# Patient Record
Sex: Male | Born: 1960 | Race: Black or African American | Hispanic: No | Marital: Married | Smoking: Former smoker
Health system: Southern US, Community
[De-identification: ages and names within clinical notes are randomized; demographics above are authoritative.]

## PROBLEM LIST (undated history)

## (undated) DIAGNOSIS — E559 Vitamin D deficiency, unspecified: Secondary | ICD-10-CM

## (undated) DIAGNOSIS — I4510 Unspecified right bundle-branch block: Secondary | ICD-10-CM

## (undated) DIAGNOSIS — E785 Hyperlipidemia, unspecified: Secondary | ICD-10-CM

## (undated) DIAGNOSIS — I2510 Atherosclerotic heart disease of native coronary artery without angina pectoris: Secondary | ICD-10-CM

## (undated) DIAGNOSIS — I700 Atherosclerosis of aorta: Secondary | ICD-10-CM

## (undated) DIAGNOSIS — K4090 Unilateral inguinal hernia, without obstruction or gangrene, not specified as recurrent: Secondary | ICD-10-CM

## (undated) DIAGNOSIS — M1990 Unspecified osteoarthritis, unspecified site: Secondary | ICD-10-CM

## (undated) DIAGNOSIS — I739 Peripheral vascular disease, unspecified: Secondary | ICD-10-CM

## (undated) DIAGNOSIS — C61 Malignant neoplasm of prostate: Secondary | ICD-10-CM

## (undated) DIAGNOSIS — L80 Vitiligo: Secondary | ICD-10-CM

## (undated) DIAGNOSIS — I5030 Unspecified diastolic (congestive) heart failure: Secondary | ICD-10-CM

## (undated) DIAGNOSIS — E8881 Metabolic syndrome: Secondary | ICD-10-CM

## (undated) DIAGNOSIS — I723 Aneurysm of iliac artery: Secondary | ICD-10-CM

## (undated) DIAGNOSIS — G4730 Sleep apnea, unspecified: Secondary | ICD-10-CM

## (undated) DIAGNOSIS — C649 Malignant neoplasm of unspecified kidney, except renal pelvis: Secondary | ICD-10-CM

## (undated) DIAGNOSIS — Z7902 Long term (current) use of antithrombotics/antiplatelets: Secondary | ICD-10-CM

## (undated) DIAGNOSIS — I779 Disorder of arteries and arterioles, unspecified: Secondary | ICD-10-CM

## (undated) DIAGNOSIS — N529 Male erectile dysfunction, unspecified: Secondary | ICD-10-CM

## (undated) DIAGNOSIS — I10 Essential (primary) hypertension: Secondary | ICD-10-CM

## (undated) DIAGNOSIS — R3129 Other microscopic hematuria: Secondary | ICD-10-CM

## (undated) HISTORY — DX: Vitiligo: L80

## (undated) HISTORY — DX: Unspecified osteoarthritis, unspecified site: M19.90

## (undated) HISTORY — DX: Other microscopic hematuria: R31.29

## (undated) HISTORY — DX: Vitamin D deficiency, unspecified: E55.9

## (undated) HISTORY — DX: Hypercalcemia: E83.52

## (undated) HISTORY — DX: Essential (primary) hypertension: I10

## (undated) HISTORY — DX: Metabolic syndrome: E88.81

## (undated) HISTORY — DX: Malignant neoplasm of unspecified kidney, except renal pelvis: C64.9

## (undated) HISTORY — DX: Metabolic syndrome: E88.810

## (undated) HISTORY — DX: Peripheral vascular disease, unspecified: I73.9

## (undated) HISTORY — DX: Hyperlipidemia, unspecified: E78.5

## (undated) HISTORY — PX: OTHER SURGICAL HISTORY: SHX169

## (undated) HISTORY — DX: Malignant neoplasm of prostate: C61

## (undated) HISTORY — PX: ILIAC VEIN ANGIOPLASTY / STENTING: SHX1788

---

## 2008-02-04 HISTORY — PX: PARTIAL NEPHRECTOMY: SHX414

## 2008-02-04 HISTORY — PX: NEPHRECTOMY: SHX65

## 2008-05-22 ENCOUNTER — Ambulatory Visit: Payer: Self-pay | Admitting: Family Medicine

## 2008-06-14 ENCOUNTER — Ambulatory Visit: Payer: Self-pay | Admitting: Nephrology

## 2008-06-26 ENCOUNTER — Ambulatory Visit: Payer: Self-pay | Admitting: Gastroenterology

## 2008-08-08 ENCOUNTER — Other Ambulatory Visit: Payer: Self-pay | Admitting: Nephrology

## 2008-08-09 ENCOUNTER — Observation Stay: Payer: Self-pay | Admitting: Nephrology

## 2010-06-20 ENCOUNTER — Ambulatory Visit: Payer: Self-pay | Admitting: Vascular Surgery

## 2011-02-04 DIAGNOSIS — I219 Acute myocardial infarction, unspecified: Secondary | ICD-10-CM

## 2011-02-04 HISTORY — DX: Acute myocardial infarction, unspecified: I21.9

## 2011-02-04 HISTORY — PX: ILIAC VEIN ANGIOPLASTY / STENTING: SHX1788

## 2011-05-02 ENCOUNTER — Other Ambulatory Visit: Payer: Self-pay | Admitting: Family Medicine

## 2011-05-02 LAB — LIPID PANEL
HDL Cholesterol: 52 mg/dL (ref 40–60)
Triglycerides: 39 mg/dL (ref 0–200)
VLDL Cholesterol, Calc: 8 mg/dL (ref 5–40)

## 2011-05-02 LAB — HEMOGLOBIN A1C: Hemoglobin A1C: 6 % (ref 4.2–6.3)

## 2011-05-02 LAB — COMPREHENSIVE METABOLIC PANEL
Chloride: 108 mmol/L — ABNORMAL HIGH (ref 98–107)
Creatinine: 1.06 mg/dL (ref 0.60–1.30)
Osmolality: 284 (ref 275–301)
Potassium: 4 mmol/L (ref 3.5–5.1)
SGOT(AST): 31 U/L (ref 15–37)
Sodium: 143 mmol/L (ref 136–145)
Total Protein: 7.5 g/dL (ref 6.4–8.2)

## 2012-01-11 ENCOUNTER — Emergency Department: Payer: Self-pay | Admitting: Internal Medicine

## 2012-01-11 DIAGNOSIS — I2109 ST elevation (STEMI) myocardial infarction involving other coronary artery of anterior wall: Secondary | ICD-10-CM

## 2012-01-11 HISTORY — DX: ST elevation (STEMI) myocardial infarction involving other coronary artery of anterior wall: I21.09

## 2012-01-11 HISTORY — PX: LEFT HEART CATH: SHX5946

## 2012-01-11 LAB — BASIC METABOLIC PANEL
BUN: 15 mg/dL (ref 7–18)
Chloride: 109 mmol/L — ABNORMAL HIGH (ref 98–107)
EGFR (African American): >60
EGFR (Non-African Amer.): >60
Glucose: 128 mg/dL — ABNORMAL HIGH (ref 65–99)
Osmolality: 282 (ref 275–301)
Potassium: 3.5 mmol/L (ref 3.5–5.1)
Sodium: 140 mmol/L (ref 136–145)

## 2012-01-11 LAB — CBC
HCT: 43.1 % (ref 40.0–52.0)
HGB: 15.1 g/dL (ref 13.0–18.0)
MCHC: 35 g/dL (ref 32.0–36.0)
MCV: 97 fL (ref 80–100)
Platelet: 246 10*3/uL (ref 150–440)
RBC: 4.44 10*6/uL (ref 4.40–5.90)
RDW: 13.7 % (ref 11.5–14.5)
WBC: 10.2 10*3/uL (ref 3.8–10.6)

## 2012-01-11 LAB — CK TOTAL AND CKMB (NOT AT ARMC)
CK, Total: 209 U/L (ref 35–232)
CK-MB: 1 ng/mL (ref 0.5–3.6)

## 2012-01-11 LAB — TROPONIN I: Troponin-I: <0.02 ng/mL

## 2012-01-26 ENCOUNTER — Encounter: Payer: Self-pay | Admitting: Cardiovascular Disease

## 2012-02-04 ENCOUNTER — Encounter: Payer: Self-pay | Admitting: Cardiovascular Disease

## 2012-03-06 ENCOUNTER — Encounter: Payer: Self-pay | Admitting: Cardiovascular Disease

## 2012-04-03 ENCOUNTER — Encounter: Payer: Self-pay | Admitting: Cardiovascular Disease

## 2012-07-20 LAB — HM HEPATITIS C SCREENING LAB: HM HEPATITIS C SCREENING: NEGATIVE

## 2012-07-20 LAB — HEMOGLOBIN A1C: Hgb A1c MFr Bld: 6 % (ref 4.0–6.0)

## 2012-11-30 DIAGNOSIS — C61 Malignant neoplasm of prostate: Secondary | ICD-10-CM | POA: Insufficient documentation

## 2013-03-08 ENCOUNTER — Emergency Department: Payer: Self-pay | Admitting: Emergency Medicine

## 2013-03-08 LAB — BASIC METABOLIC PANEL
ANION GAP: 8 (ref 7–16)
BUN: 27 mg/dL — ABNORMAL HIGH (ref 7–18)
CALCIUM: 10.2 mg/dL — AB (ref 8.5–10.1)
CO2: 22 mmol/L (ref 21–32)
Chloride: 104 mmol/L (ref 98–107)
Creatinine: 1.32 mg/dL — ABNORMAL HIGH (ref 0.60–1.30)
EGFR (African American): >60
Glucose: 127 mg/dL — ABNORMAL HIGH (ref 65–99)
OSMOLALITY: 275 (ref 275–301)
POTASSIUM: 3.6 mmol/L (ref 3.5–5.1)
SODIUM: 134 mmol/L — AB (ref 136–145)

## 2013-03-08 LAB — CBC WITH DIFFERENTIAL/PLATELET
Basophil #: 0 10*3/uL (ref 0.0–0.1)
Basophil %: 0.2 %
EOS ABS: 0 10*3/uL (ref 0.0–0.7)
Eosinophil %: 0.1 %
HCT: 43.8 % (ref 40.0–52.0)
HGB: 14.4 g/dL (ref 13.0–18.0)
LYMPHS ABS: 0.8 10*3/uL — AB (ref 1.0–3.6)
Lymphocyte %: 6.6 %
MCH: 32.3 pg (ref 26.0–34.0)
MCHC: 32.9 g/dL (ref 32.0–36.0)
MCV: 98 fL (ref 80–100)
MONO ABS: 0.4 x10 3/mm (ref 0.2–1.0)
Monocyte %: 3.3 %
NEUTROS ABS: 11.2 10*3/uL — AB (ref 1.4–6.5)
NEUTROS PCT: 89.8 %
Platelet: 264 10*3/uL (ref 150–440)
RBC: 4.46 10*6/uL (ref 4.40–5.90)
RDW: 14.1 % (ref 11.5–14.5)
WBC: 12.4 10*3/uL — ABNORMAL HIGH (ref 3.8–10.6)

## 2013-05-16 ENCOUNTER — Ambulatory Visit: Payer: Self-pay | Admitting: Vascular Surgery

## 2013-05-16 LAB — BASIC METABOLIC PANEL
Anion Gap: 5 — ABNORMAL LOW (ref 7–16)
BUN: 14 mg/dL (ref 7–18)
Calcium, Total: 9.7 mg/dL (ref 8.5–10.1)
Chloride: 109 mmol/L — ABNORMAL HIGH (ref 98–107)
Co2: 26 mmol/L (ref 21–32)
Creatinine: 1.06 mg/dL (ref 0.60–1.30)
EGFR (African American): >60
GLUCOSE: 111 mg/dL — AB (ref 65–99)
Osmolality: 281 (ref 275–301)
Potassium: 3.6 mmol/L (ref 3.5–5.1)
Sodium: 140 mmol/L (ref 136–145)

## 2013-05-17 LAB — CBC WITH DIFFERENTIAL/PLATELET
Basophil #: 0 10*3/uL (ref 0.0–0.1)
Basophil %: 0 %
Eosinophil #: 0 10*3/uL (ref 0.0–0.7)
Eosinophil %: 0 %
HCT: 35.8 % — AB (ref 40.0–52.0)
HGB: 11.8 g/dL — AB (ref 13.0–18.0)
LYMPHS ABS: 0.5 10*3/uL — AB (ref 1.0–3.6)
Lymphocyte %: 4 %
MCH: 33.2 pg (ref 26.0–34.0)
MCHC: 33.1 g/dL (ref 32.0–36.0)
MCV: 100 fL (ref 80–100)
Monocyte #: 0.5 x10 3/mm (ref 0.2–1.0)
Monocyte %: 4.3 %
Neutrophil #: 11 10*3/uL — ABNORMAL HIGH (ref 1.4–6.5)
Neutrophil %: 91.7 %
PLATELETS: 172 10*3/uL (ref 150–440)
RBC: 3.57 10*6/uL — ABNORMAL LOW (ref 4.40–5.90)
RDW: 14.5 % (ref 11.5–14.5)
WBC: 12.1 10*3/uL — AB (ref 3.8–10.6)

## 2013-05-17 LAB — BASIC METABOLIC PANEL
Anion Gap: 7 (ref 7–16)
BUN: 19 mg/dL — ABNORMAL HIGH (ref 7–18)
CHLORIDE: 109 mmol/L — AB (ref 98–107)
CREATININE: 1.12 mg/dL (ref 0.60–1.30)
Calcium, Total: 9.1 mg/dL (ref 8.5–10.1)
Co2: 23 mmol/L (ref 21–32)
EGFR (Non-African Amer.): >60
GLUCOSE: 150 mg/dL — AB (ref 65–99)
Osmolality: 283 (ref 275–301)
Potassium: 4 mmol/L (ref 3.5–5.1)
Sodium: 139 mmol/L (ref 136–145)

## 2013-08-03 LAB — LIPID PANEL
Cholesterol: 138 mg/dL (ref 0–200)
HDL: 62 mg/dL (ref 35–70)
LDL Cholesterol: 69 mg/dL
TRIGLYCERIDES: 35 mg/dL — AB (ref 40–160)

## 2013-09-22 LAB — PSA: PSA: 3.7

## 2014-05-27 NOTE — Op Note (Signed)
PATIENT NAME:  Roberto Walker, Roberto Walker MR#:  [id number] DATE OF BIRTH:  1960/05/23  DATE OF PROCEDURE:  05/16/2013  PREOPERATIVE DIAGNOSES: 1.  Thrombosis/embolus of left lower extremity, status post recent intervention, with ischemic rest pain and peripheral arterial disease.  2.  Coronary artery disease.  3.  Prostate cancer.   POSTOPERATIVE DIAGNOSES: 1.  Thrombosis/embolus of left lower extremity, status post recent intervention, with ischemic rest pain and peripheral arterial disease.  2.  Coronary artery disease.  3.  Prostate cancer.   PROCEDURE:   1.  Ultrasound guidance for vascular access to bilateral femoral arteries.  2.  Catheter placement to aorta from left femoral approach.  3.  Catheter placement to left anterior tibial artery and left posterior tibial artery from right femoral approach.  4.  Aortogram and selective left lower extremity angiogram.  5.  Catheter-directed thrombolysis with 4 mg of tPA to the left external iliac artery.  6.  Percutaneous transluminal angioplasty of left external iliac artery with 7 and 8 mm diameter angioplasty balloon.  7.  Self-expanding stent placement to the distal left common iliac artery and proximal left external iliac artery with a 10 mm diameter self-expanding stent.  8.  Catheter-directed thrombolysis with 4 mg of tPA to the left popliteal, tibioperoneal trunk, and posterior tibial arteries.  9.  Mechanical rheolytic thrombectomy to left popliteal, tibioperoneal trunk, and posterior tibial arteries.  10.  Mechanical rheolytic thrombectomy also to the left external iliac artery.  11.  Percutaneous transluminal angioplasty with 4 mm diameter angioplasty balloon to left popliteal artery, tibioperoneal trunk, and posterior tibial artery.  12.  Percutaneous transluminal angioplasty of left anterior tibial artery with a 4 mm diameter angioplasty balloon.  13.  StarClose closure device to bilateral femoral arteries.   SURGEON: Algernon Huxley, MD    ANESTHESIA: Local with moderate conscious sedation.   ESTIMATED BLOOD LOSS: Approximately 50 mL.  FLUOROSCOPY TIME: 24.8 minutes.   CONTRAST USED: 110 mL.   INDICATION FOR PROCEDURE: This is a gentleman who was intervened upon for claudication symptoms, who presented back to the hospital with worsening pain and numbness in the left lower extremity and reduced perfusion. He is brought back to the angiography suite for further evaluation. Risks and benefits were discussed. Informed consent was obtained.   DESCRIPTION OF PROCEDURE: The patient was brought to the vascular suite. Groins were shaved and prepped, and a sterile surgical field was created. I initially started through the right groin. Imaging was performed, which showed rethrombosis of the left external iliac artery stent that had been previously intervened upon. He reconstituted in the common femoral artery. Perfusion distally was difficult to opacify, but no flow was seen past the popliteal artery. I could not cross the occlusion from the right femoral approach in the left iliac due to the marked tortuosity and steep aortic bifurcation with no catheter support. I had to stick the left common femoral artery. Both femorals were actually accessed under ultrasound guidance, and a 6-French sheath was placed on the left. I was able to cross the occlusion from the left femoral access and confirm intraluminal flow. I then replaced the wire up into the aorta; 4 mg of tPA was instilled to the left external iliac artery. This was allowed to dwell for approximately 10 minutes. Mechanical rheolytic thrombectomy was then performed, but thrombus still remained. It was ballooned with a 7 and 8 mm diameter angioplasty balloon, but there was a chunk of thrombus and native disease at the origin  of the left external iliac artery that remained flow limiting. I elected to treat this with a self-expanding stent. A 10 mm diameter x 4 cm length self-expanding stent  was deployed due to the larger size of the common iliac artery. The external iliac portion was treated with an 8 mm balloon, the common iliac area was treated with a 10 mm balloon, and  there was a good angiographic completion result. I could now cross from the right femoral approach and could catheter access into the left superficial femoral artery. Imaging was performed, which showed embolus and thrombosis to the left popliteal artery and tibial vessels. A 6-French High-Flex Ansel sheath was placed over a Terumo Advantage wire. I removed the left femoral sheath, and StarClose closure device was deployed on the left. I was able to cross the occlusion and confirm intraluminal flow in the posterior tibial artery. I then replaced the 0.035 wire; 4 mg more of tPA were delivered in the left popliteal artery, tibioperoneal trunk, and posterior tibial arteries. This was allowed to dwell, and then mechanical rheolytic thrombectomy was performed on the same vessels. Following this, a 4 mm diameter angioplasty balloon was inflated from the mid popliteal artery down to the proximal posterior tibial artery, with improvement of flow but not resolution of the occlusion. There was thrombus in the proximal anterior tibial artery. I was able to rewire this and cross this lesion without difficulty and treat this area with a 4 mm diameter angioplasty balloon separately in the anterior tibial artery. At this point, imaging was performed. The posterior tibial artery was occluded distally, but did have some collaterals and was patent proximally. The peroneal artery also had improved flow, and the anterior tibial artery was continuous down to the foot. There was still some residual thrombus in  all the tibial vessels remaining, but there was now markedly improved flow. I elected to leave this and treat it with a GIII/IIb inhibitor. StarClose closure device was then deployed in the right femoral artery with excellent hemostatic result.  The patient was then taken to the recovery room in stable condition having tolerated the procedure well with a palpable pulse at the ankle.    ____________________________ Algernon Huxley, MD jsd:jcm D: 05/16/2013 16:57:53 ET T: 05/16/2013 21:33:10 ET JOB#: [id number]  cc: Algernon Huxley, MD, <Dictator> Algernon Huxley MD ELECTRONICALLY SIGNED 05/19/2013 15:30

## 2014-05-27 NOTE — Op Note (Signed)
PATIENT NAME:  Roberto Walker, Roberto Walker MR#:  [id number] DATE OF BIRTH:  09-13-1960  DATE OF PROCEDURE:  05/16/2013  PREOPERATIVE DIAGNOSIS: Peripheral arterial disease with claudication, left lower extremity.  POSTOPERATIVE DIAGNOSIS: Peripheral arterial disease with claudication, left lower extremity.  PROCEDURES: 1.  Ultrasound guidance for vascular access bilateral femoral arteries.  2.  Catheter placement into aorta from bilateral femoral approaches.  3.  Aortogram and iliofemoral arteriogram.  4.  Percutaneous transluminal angioplasty with drug-eluting 6 mm diameter angioplasty balloon to the left external iliac artery.  5.  Percutaneous transluminal angioplasty of left external iliac artery with 7 mm and 8 mm  diameter conventional angioplasty balloon.  6.  Catheter directed thrombolysis with 4 mg of TPA delivered with the AngioJet AVX catheter to the left external iliac artery.  7.  Mechanical rheolytic thrombectomy of left external iliac artery.  8.  StarClose closure device of bilateral femoral arteries.   SURGEON: Algernon Huxley, M.D.   ANESTHESIA: Local with moderate conscious sedation.   ESTIMATED BLOOD LOSS: 25 mL.   INDICATION FOR PROCEDURE: This is a 54 year old African American male who has short distance lifestyle limiting claudication of the left lower extremity. He has a previous history of left iliac artery intervention. Noninvasive study has shown this to be re-occluded. He is brought in today for angiography for further evaluation and potential treatment. Risks and benefits were discussed. Informed consent was obtained.   DESCRIPTION OF PROCEDURE: The patient is brought to the vascular suite. Groins were shaved and prepped and a sterile surgical field was created. I initially started by accessing the right femoral artery. Ultrasound is used to visualize the femoral arteries. Needle was used to access the femoral artery without difficulty, and a 5-French sheath was then placed.  Pigtail catheter was placed in the aorta and an AP aortogram was performed. This demonstrated marked tortuosity of the iliac vessels with no flow-limiting stenosis in the right iliac system. The left common iliac artery was generous and was over 10 mm in diameter. The left external iliac artery where the stent had previously been placed has an occlusion that reconstituted in the common femoral artery on delayed imaging. I attempted to cross the aortic bifurcation, but due to the marked tortuosity and the steep aortic bifurcation this was not going to be successful in crossing the lesion. I then used ultrasound to access to left femoral artery. Under direct ultrasound guidance this was accessed without difficulty and a permanent image was recorded. J-wire and 6-French sheath were then placed. The patient was systemically heparinized with 4000 units of intravenous heparin. I was able to get a Kumpe catheter and an Advantage wire and cross the lesion of the occlusion in the external iliac artery. I confirmed intraluminal flow up in the aorta and then replaced the wire. I initially treated this lesion with a 6 mm diameter Lutonix drug-coated angioplasty balloon. However, there was still significant residual stenosis throughout the stent and in the external iliac artery below the stent. I used an 8 mm diameter angioplasty balloon at the proximal edge of the stent where the common iliac artery was larger and a 7 mm diameter angioplasty balloon distally. Following this there was thrombus present throughout the stent, particularly at the proximal portion. I instilled 4 mg of TPA with the AngioJet AVX catheter in power pulse spray fashion and allowed this to dwell. Mechanical rheolytic thrombectomy was then performed which resolved about 90% of the thrombus. There was still a small amount of residual  thrombus, particularly at the leading edge of the stent and into the hypogastric artery. I retreated the area with a 7 mm  diameter angioplasty balloon. At this point, there was in-line flow. The residual thrombus did not appear flow-limiting and there was brisk flow into the external iliac artery and common femoral artery where the sheath was at. Further mechanical rheolytic thrombectomy was performed, but the thrombus would not clear. At this point, I elected to terminate the procedure and we will keep the patient on anticoagulation. The sheath was removed. A StarClose closure device was deployed in bilateral femoral arteries with excellent hemostatic result. The patient tolerated the procedure well and was taken to the recovery room in stable condition.   ____________________________ Algernon Huxley, MD jsd:sb D: 05/16/2013 11:47:53 ET T: 05/16/2013 12:14:36 ET JOB#: [id number]  cc: Algernon Huxley, MD, <Dictator> Bethena Roys. Ancil Boozer, MD  Algernon Huxley MD ELECTRONICALLY SIGNED 05/19/2013 15:30

## 2014-08-02 ENCOUNTER — Encounter: Payer: Self-pay | Admitting: Family Medicine

## 2014-08-02 DIAGNOSIS — N529 Male erectile dysfunction, unspecified: Secondary | ICD-10-CM | POA: Insufficient documentation

## 2014-08-02 DIAGNOSIS — E559 Vitamin D deficiency, unspecified: Secondary | ICD-10-CM | POA: Insufficient documentation

## 2014-08-02 DIAGNOSIS — E8881 Metabolic syndrome: Secondary | ICD-10-CM | POA: Insufficient documentation

## 2014-08-02 DIAGNOSIS — E785 Hyperlipidemia, unspecified: Secondary | ICD-10-CM | POA: Insufficient documentation

## 2014-08-02 DIAGNOSIS — I2510 Atherosclerotic heart disease of native coronary artery without angina pectoris: Secondary | ICD-10-CM | POA: Insufficient documentation

## 2014-08-02 DIAGNOSIS — I739 Peripheral vascular disease, unspecified: Secondary | ICD-10-CM | POA: Insufficient documentation

## 2014-08-02 DIAGNOSIS — M1990 Unspecified osteoarthritis, unspecified site: Secondary | ICD-10-CM | POA: Insufficient documentation

## 2014-08-02 DIAGNOSIS — L80 Vitiligo: Secondary | ICD-10-CM | POA: Insufficient documentation

## 2014-08-02 DIAGNOSIS — I252 Old myocardial infarction: Secondary | ICD-10-CM | POA: Insufficient documentation

## 2014-08-02 DIAGNOSIS — I10 Essential (primary) hypertension: Secondary | ICD-10-CM | POA: Insufficient documentation

## 2014-08-02 DIAGNOSIS — R739 Hyperglycemia, unspecified: Secondary | ICD-10-CM | POA: Insufficient documentation

## 2014-08-02 DIAGNOSIS — E291 Testicular hypofunction: Secondary | ICD-10-CM | POA: Insufficient documentation

## 2014-08-02 DIAGNOSIS — G4733 Obstructive sleep apnea (adult) (pediatric): Secondary | ICD-10-CM | POA: Insufficient documentation

## 2014-08-02 DIAGNOSIS — R202 Paresthesia of skin: Secondary | ICD-10-CM | POA: Insufficient documentation

## 2014-08-02 DIAGNOSIS — T783XXA Angioneurotic edema, initial encounter: Secondary | ICD-10-CM | POA: Insufficient documentation

## 2014-08-02 DIAGNOSIS — Z9989 Dependence on other enabling machines and devices: Secondary | ICD-10-CM

## 2014-08-04 ENCOUNTER — Ambulatory Visit (INDEPENDENT_AMBULATORY_CARE_PROVIDER_SITE_OTHER): Payer: Federal, State, Local not specified - PPO | Admitting: Family Medicine

## 2014-08-04 ENCOUNTER — Encounter (INDEPENDENT_AMBULATORY_CARE_PROVIDER_SITE_OTHER): Payer: Self-pay

## 2014-08-04 ENCOUNTER — Encounter: Payer: Self-pay | Admitting: Family Medicine

## 2014-08-04 ENCOUNTER — Other Ambulatory Visit: Payer: Self-pay

## 2014-08-04 VITALS — BP 126/88 | HR 71 | Temp 98.1°F | Resp 16 | Ht 72.0 in | Wt 211.2 lb

## 2014-08-04 DIAGNOSIS — R739 Hyperglycemia, unspecified: Secondary | ICD-10-CM | POA: Diagnosis not present

## 2014-08-04 DIAGNOSIS — Z1211 Encounter for screening for malignant neoplasm of colon: Secondary | ICD-10-CM | POA: Diagnosis not present

## 2014-08-04 DIAGNOSIS — G4733 Obstructive sleep apnea (adult) (pediatric): Secondary | ICD-10-CM

## 2014-08-04 DIAGNOSIS — I10 Essential (primary) hypertension: Secondary | ICD-10-CM

## 2014-08-04 DIAGNOSIS — E559 Vitamin D deficiency, unspecified: Secondary | ICD-10-CM | POA: Diagnosis not present

## 2014-08-04 DIAGNOSIS — I2510 Atherosclerotic heart disease of native coronary artery without angina pectoris: Secondary | ICD-10-CM

## 2014-08-04 DIAGNOSIS — E785 Hyperlipidemia, unspecified: Secondary | ICD-10-CM

## 2014-08-04 DIAGNOSIS — Z9989 Dependence on other enabling machines and devices: Secondary | ICD-10-CM

## 2014-08-04 MED ORDER — NITROGLYCERIN 0.4 MG SL SUBL: 0.4000 mg | SUBLINGUAL_TABLET | Freq: Every day | SUBLINGUAL | Status: DC

## 2014-08-04 NOTE — Progress Notes (Signed)
Name: Roberto Walker   MRN: [id number]    DOB: [birth year]   Date:08/04/2014       Progress Note  Subjective  Chief Complaint  Chief Complaint  Patient presents with  . Follow-up    4 month  . Hyperlipidemia    Patient states that he is having no issues at this time.   . Hypertension    Patient states that he is checking his blood pressure at home and it is currently running 125/86    HPI  CAD: he had an MI back in 2013, s/p stent placement, he has been asymptomatic since event. He sees Dr. Terance Hart at [facility] twice year. Taking Aspirin and Plavix, he denies easy bruising or bleeding.   HTN: usually under control, recent visits to previous providers it was within normal limits  Hyperlipidemia: taking Atorvastatin, and is compliant , denies myalgia.   OSA: he is compliant with CPAP machine, wakes up feeling rested. He states only needs 6 hours of sleep and he feels great during the day.  Patient Active Problem List   Diagnosis Date Noted  . Benign essential HTN 08/02/2014  . Arteriosclerosis of coronary artery 08/02/2014  . Dyslipidemia 08/02/2014  . H/O acute myocardial infarction 08/02/2014  . Calcium blood increased 08/02/2014  . 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/02/2014  . Dysmetabolic syndrome 89/21/1941  . Arthritis, degenerative 08/02/2014  . Cancer of kidney 08/02/2014  . Peripheral blood vessel disorder 08/02/2014  . Blood glucose elevated 08/02/2014  . Obstructive sleep apnea on CPAP 08/02/2014  . Vitamin D deficiency 08/02/2014  . Vitiligo 08/02/2014  . CA of prostate 10/01/2012    Past Surgical History  Procedure Laterality Date  . Left heart cath  01/11/2012  . Beam radiation      Prostate using Photons  . Nephrectomy Right 2010    Partial  . Iliac vein angioplasty / stenting      Family History  Problem Relation Age of Onset  . Hypertension Mother   . Lung cancer Father   . Hypertension Father   . Hyperlipidemia Father    . Diabetes Sister     oldest sister  . Hypertension Sister   . Hypertension Sister     History   Social History  . Marital Status: Married    Spouse Name: Mateo Flow  . Number of Children: 2  . Years of Education: College    Occupational History  . [employer]    Social History Main Topics  . Smoking status: Former Smoker -- 20 years    Types: Cigarettes    Quit date: 01/12/2012  . Smokeless tobacco: Not on file  . Alcohol Use: 0.0 oz/week    0 Standard drinks or equivalent per week     Comment: Minimal alcohol consumption  . Drug Use: No  . Sexual Activity:    Partners: Female   Other Topics Concern  . Not on file   Social History Narrative     Current outpatient prescriptions:  .  aspirin 81 MG tablet, Take 1 tablet by mouth daily., Disp: , Rfl:  .  amLODipine-valsartan (EXFORGE) 10-320 MG per tablet, Take 1 tablet by mouth daily., Disp: , Rfl:  .  atorvastatin (LIPITOR) 80 MG tablet, Take 1 tablet by mouth daily., Disp: , Rfl:  .  carvedilol (COREG) 12.5 MG tablet, Take 1 tablet by mouth 2 (two) times daily., Disp: , Rfl:  .  cholecalciferol (VITAMIN D) 1000 UNITS tablet, Take 1 tablet  by mouth daily., Disp: , Rfl:  .  clopidogrel (PLAVIX) 75 MG tablet, Take 1 tablet by mouth daily., Disp: , Rfl:  .  Naproxen Sodium (ALEVE) 220 MG CAPS, Take 1 tablet by mouth as needed., Disp: , Rfl:  .  nitroGLYCERIN (NITROSTAT) 0.4 MG SL tablet, Place 1 tablet (0.4 mg total) under the tongue daily., Disp: 30 tablet, Rfl: 0 .  sildenafil (VIAGRA) 50 MG tablet, Take 1 tablet by mouth as needed., Disp: , Rfl:   Allergies  Allergen Reactions  . Lisinopril Swelling    Swelling around mouth, lips     ROS  Constitutional: Negative for fever or weight change.  Respiratory: Negative for cough and shortness of breath.   Cardiovascular: Negative for chest pain or palpitations.  Gastrointestinal: Negative for abdominal pain, no bowel changes.  Musculoskeletal: Negative for gait problem or  joint swelling.  Skin: Negative for rash. He has vitiligo Neurological: Negative for dizziness or headache.  No other specific complaints in a complete review of systems (except as listed in HPI above).   Objective  Filed Vitals:   08/04/14 1008  BP: 146/92  Pulse: 71  Temp: 98.1 F (36.7 C)  TempSrc: Oral  Resp: 16  Height: 6' (1.829 m)  Weight: 211 lb 3.2 oz (95.8 kg)  SpO2: 97%    Body mass index is 28.64 kg/(m^2).  Physical Exam  Constitutional: Patient appears well-developed and well-nourished. No distress.  Eyes:  No scleral icterus. PERL Neck: Normal range of motion. Neck supple. Cardiovascular: Normal rate, regular rhythm and normal heart sounds.  No murmur heard. No BLE edema. Pulmonary/Chest: Effort normal and breath sounds normal. No respiratory distress. Abdominal: Soft.  There is no tenderness. Psychiatric: Patient has a normal mood and affect. behavior is normal. Judgment and thought content normal. Skin: vitiligo   PHQ2/9: Depression screen PHQ 2/9 08/04/2014  Decreased Interest 0  Down, Depressed, Hopeless 0  PHQ - 2 Score 0     Fall Risk: Fall Risk  08/04/2014  Falls in the past year? No    Assessment & Plan  1. Arteriosclerosis of coronary artery  - nitroGLYCERIN (NITROSTAT) 0.4 MG SL tablet; Place 1 tablet (0.4 mg total) under the tongue daily.  Dispense: 30 tablet; Refill: 0  2. Dyslipidemia  - Lipid Profile  3. Benign essential HTN  - Comprehensive Metabolic Panel (CMET) - CBC  4. Obstructive sleep apnea on CPAP Compliant with CPAP machine  5. Vitamin D deficiency  - Vitamin D (25 hydroxy)  6. Hyperglycemia  - HgB A1c  7. Colon cancer screening Refer back to Dr. Durwin Reges  - Ambulatory referral to Gastroenterology

## 2014-08-05 LAB — COMPREHENSIVE METABOLIC PANEL
ALK PHOS: 65 IU/L (ref 39–117)
ALT: 16 IU/L (ref 0–44)
AST: 15 IU/L (ref 0–40)
Albumin/Globulin Ratio: 1.6 (ref 1.1–2.5)
Albumin: 4.4 g/dL (ref 3.5–5.5)
BUN/Creatinine Ratio: 12 (ref 9–20)
BUN: 10 mg/dL (ref 6–24)
Bilirubin Total: 0.3 mg/dL (ref 0.0–1.2)
CO2: 18 mmol/L (ref 18–29)
Calcium: 10.2 mg/dL (ref 8.7–10.2)
Chloride: 102 mmol/L (ref 97–108)
Creatinine, Ser: 0.81 mg/dL (ref 0.76–1.27)
GFR calc Af Amer: 116 mL/min/{1.73_m2} (ref >59)
GFR, EST NON AFRICAN AMERICAN: 101 mL/min/{1.73_m2} (ref >59)
GLUCOSE: 85 mg/dL (ref 65–99)
Globulin, Total: 2.7 g/dL (ref 1.5–4.5)
Potassium: 4 mmol/L (ref 3.5–5.2)
Sodium: 139 mmol/L (ref 134–144)
Total Protein: 7.1 g/dL (ref 6.0–8.5)

## 2014-08-05 LAB — CBC
Hematocrit: 40.6 % (ref 37.5–51.0)
Hemoglobin: 14.2 g/dL (ref 12.6–17.7)
MCH: 33.3 pg — ABNORMAL HIGH (ref 26.6–33.0)
MCHC: 35 g/dL (ref 31.5–35.7)
MCV: 95 fL (ref 79–97)
Platelets: 260 10*3/uL (ref 150–379)
RBC: 4.26 x10E6/uL (ref 4.14–5.80)
RDW: 14.7 % (ref 12.3–15.4)
WBC: 6.4 10*3/uL (ref 3.4–10.8)

## 2014-08-05 LAB — VITAMIN D 25 HYDROXY (VIT D DEFICIENCY, FRACTURES): VIT D 25 HYDROXY: 25.8 ng/mL — AB (ref 30.0–100.0)

## 2014-08-05 LAB — LIPID PANEL
CHOLESTEROL TOTAL: 154 mg/dL (ref 100–199)
Chol/HDL Ratio: 2.7 ratio units (ref 0.0–5.0)
HDL: 58 mg/dL (ref >39)
LDL Calculated: 82 mg/dL (ref 0–99)
TRIGLYCERIDES: 69 mg/dL (ref 0–149)
VLDL Cholesterol Cal: 14 mg/dL (ref 5–40)

## 2014-08-08 ENCOUNTER — Telehealth: Payer: Self-pay | Admitting: Family Medicine

## 2014-08-08 LAB — HEMOGLOBIN A1C: Hgb A1c MFr Bld: 5.8 % — ABNORMAL HIGH (ref 4.8–5.6)

## 2014-08-08 NOTE — Telephone Encounter (Signed)
PLEASE GIVE THE PHARM A CALL ABOUT THE DIRECTION ON THE PT RX FOR NITRO.

## 2014-08-08 NOTE — Progress Notes (Signed)
Patient notified of A1C

## 2014-08-16 ENCOUNTER — Telehealth: Payer: Self-pay | Admitting: Gastroenterology

## 2014-08-16 NOTE — Telephone Encounter (Signed)
Phoned pt for colon triage, He said he will call back he was at work

## 2015-02-06 ENCOUNTER — Encounter: Payer: Self-pay | Admitting: Family Medicine

## 2015-02-06 ENCOUNTER — Ambulatory Visit (INDEPENDENT_AMBULATORY_CARE_PROVIDER_SITE_OTHER): Payer: Federal, State, Local not specified - PPO | Admitting: Family Medicine

## 2015-02-06 VITALS — BP 128/86 | HR 75 | Temp 98.4°F | Resp 16 | Wt 209.2 lb

## 2015-02-06 DIAGNOSIS — I739 Peripheral vascular disease, unspecified: Secondary | ICD-10-CM | POA: Diagnosis not present

## 2015-02-06 DIAGNOSIS — E8881 Metabolic syndrome: Secondary | ICD-10-CM | POA: Diagnosis not present

## 2015-02-06 DIAGNOSIS — E785 Hyperlipidemia, unspecified: Secondary | ICD-10-CM

## 2015-02-06 DIAGNOSIS — E559 Vitamin D deficiency, unspecified: Secondary | ICD-10-CM

## 2015-02-06 DIAGNOSIS — G4733 Obstructive sleep apnea (adult) (pediatric): Secondary | ICD-10-CM | POA: Diagnosis not present

## 2015-02-06 DIAGNOSIS — I2510 Atherosclerotic heart disease of native coronary artery without angina pectoris: Secondary | ICD-10-CM

## 2015-02-06 DIAGNOSIS — I252 Old myocardial infarction: Secondary | ICD-10-CM | POA: Diagnosis not present

## 2015-02-06 DIAGNOSIS — I10 Essential (primary) hypertension: Secondary | ICD-10-CM | POA: Diagnosis not present

## 2015-02-06 DIAGNOSIS — Z85528 Personal history of other malignant neoplasm of kidney: Secondary | ICD-10-CM | POA: Insufficient documentation

## 2015-02-06 DIAGNOSIS — Z9989 Dependence on other enabling machines and devices: Secondary | ICD-10-CM

## 2015-02-06 MED ORDER — CLOPIDOGREL BISULFATE 75 MG PO TABS: 75.0000 mg | ORAL_TABLET | Freq: Every day | ORAL | Status: DC

## 2015-02-06 MED ORDER — CARVEDILOL 12.5 MG PO TABS: 12.5000 mg | ORAL_TABLET | Freq: Two times a day (BID) | ORAL | Status: DC

## 2015-02-06 MED ORDER — ATORVASTATIN CALCIUM 80 MG PO TABS: 80.0000 mg | ORAL_TABLET | Freq: Every day | ORAL | Status: DC

## 2015-02-06 NOTE — Progress Notes (Signed)
Name: Roberto Walker   MRN: [id number]    DOB: Nov 07, 1960   Date:02/06/2015       Progress Note  Subjective  Chief Complaint  Chief Complaint  Patient presents with  . Hypertension    patient is here for his 33-month follow-up  . Hyperlipidemia  . Medication Refill    HPI  CAD: he had an MI back in 2013, s/p stent placement, he has been asymptomatic since event. He sees Dr. Terance Hart at [facility] once year. Taking Aspirin and Plavix, he denies easy bruising or bleeding.  No chest pain , he is very active at work, and walks on treadmill for at least 3 miles per day.   HTN: bp is at goal, taking medication, no side effects of medication. No chest pain or palpitation   Hyperlipidemia: taking Atorvastatin, and is compliant , denies myalgia. We will recheck labs today   OSA: he is not as compliant with CPAP machine currently only wearing machine once every two weeks. He states he has been sleeping with head of the bed elevated and is not snoring as much, also waking up feeling rested.  Explained importance of CPAP use to decrease risk of heart attacks or strokes.   History of kidney and prostate: he is going to [facility] to check on prostate once every 6 months. S/p radiation . January 2016  PVD: sees Dr. Lucky Cowboy once a year, no claudication, very active, no leg swelling.   Patient Active Problem List   Diagnosis Date Noted  . Personal history of kidney cancer 02/06/2015  . Benign essential HTN 08/02/2014  . Arteriosclerosis of coronary artery 08/02/2014  . Dyslipidemia 08/02/2014  . H/O acute myocardial infarction 08/02/2014  . 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/02/2014  . Dysmetabolic syndrome [id number]  . Arthritis, degenerative 08/02/2014  . Peripheral blood vessel disorder ([place]) 08/02/2014  . Blood glucose elevated 08/02/2014  . Obstructive sleep apnea on CPAP 08/02/2014  . Vitamin D deficiency 08/02/2014  . Vitiligo 08/02/2014  . Malignant neoplasm of prostate  ([place]) 11/30/2012    Past Surgical History  Procedure Laterality Date  . Left heart cath  01/11/2012  . Beam radiation      Prostate using Photons  . Nephrectomy Right 2010    Partial  . Iliac vein angioplasty / stenting      Family History  Problem Relation Age of Onset  . Hypertension Mother   . Lung cancer Father   . Hypertension Father   . Hyperlipidemia Father   . Diabetes Sister     oldest sister  . Hypertension Sister   . Hypertension Sister     Social History   Social History  . Marital Status: Married    Spouse Name: Mateo Flow  . Number of Children: 2  . Years of Education: College    Occupational History  . [employer]    Social History Main Topics  . Smoking status: Former Smoker -- 20 years    Types: Cigarettes    Quit date: 01/12/2012  . Smokeless tobacco: Not on file  . Alcohol Use: 0.0 oz/week    0 Standard drinks or equivalent per week     Comment: Minimal alcohol consumption  . Drug Use: No  . Sexual Activity:    Partners: Female   Other Topics Concern  . Not on file   Social History Narrative     Current outpatient prescriptions:  .  amLODipine-valsartan (EXFORGE) 10-320 MG per tablet, Take 1 tablet  by mouth daily., Disp: , Rfl:  .  aspirin 81 MG tablet, Take 1 tablet by mouth daily., Disp: , Rfl:  .  atorvastatin (LIPITOR) 80 MG tablet, Take 1 tablet by mouth daily., Disp: , Rfl:  .  carvedilol (COREG) 12.5 MG tablet, Take 1 tablet by mouth 2 (two) times daily., Disp: , Rfl:  .  cholecalciferol (VITAMIN D) 1000 UNITS tablet, Take 1 tablet by mouth daily., Disp: , Rfl:  .  clopidogrel (PLAVIX) 75 MG tablet, Take 1 tablet by mouth daily., Disp: , Rfl:  .  Naproxen Sodium (ALEVE) 220 MG CAPS, Take 1 tablet by mouth as needed., Disp: , Rfl:  .  nitroGLYCERIN (NITROSTAT) 0.4 MG SL tablet, Place 1 tablet (0.4 mg total) under the tongue daily., Disp: 30 tablet, Rfl: 0 .  sildenafil (VIAGRA) 50 MG tablet, Take 1 tablet by mouth as needed., Disp: , Rfl:    Allergies  Allergen Reactions  . Lisinopril Swelling    Swelling around mouth, lips     ROS  Constitutional: Negative for fever or weight change.  Respiratory: Negative for cough and shortness of breath.   Cardiovascular: Negative for chest pain or palpitations.  Gastrointestinal: Negative for abdominal pain, no bowel changes.  Musculoskeletal: Negative for gait problem or joint swelling.  Skin: Negative for rash.  Neurological: Negative for dizziness or headache.  No other specific complaints in a complete review of systems (except as listed in HPI above).  Objective  Filed Vitals:   02/06/15 0849  BP: 128/86  Pulse: 75  Temp: 98.4 F (36.9 C)  TempSrc: Oral  Resp: 16  Weight: 209 lb 3.2 oz (94.892 kg)  SpO2: 97%    Body mass index is 28.37 kg/(m^2).  Physical Exam  Constitutional: Patient appears well-developed and well-nourished. Obese  No distress.  HEENT: head atraumatic, normocephalic, pupils equal and reactive to light, neck supple, throat within normal limits Cardiovascular: Normal rate, regular rhythm and normal heart sounds.  No murmur heard. No BLE edema. Pulmonary/Chest: Effort normal and breath sounds normal. No respiratory distress. Abdominal: Soft.  There is no tenderness. Psychiatric: Patient has a normal mood and affect. behavior is normal. Judgment and thought content normal.  PHQ2/9: Depression screen [facility] 2/9 02/06/2015 08/04/2014  Decreased Interest 0 0  Down, Depressed, Hopeless 0 0  PHQ - 2 Score 0 0    Fall Risk: Fall Risk  02/06/2015 08/04/2014  Falls in the past year? No No    Functional Status Survey: Is the patient deaf or have difficulty hearing?: No Does the patient have difficulty seeing, even when wearing glasses/contacts?: No Does the patient have difficulty concentrating, remembering, or making decisions?: No Does the patient have difficulty walking or climbing stairs?: No Does the patient have difficulty dressing or bathing?:  No Does the patient have difficulty doing errands alone such as visiting a doctor's office or shopping?: No    Assessment & Plan  1. Benign essential HTN  - Comprehensive metabolic panel  2. Arteriosclerosis of coronary artery  - CBC with Differential/Platelet - Lipid panel  3. Peripheral blood vessel disorder ([place])  Continue yearly follow up with Dr. Lucky Cowboy  4. Obstructive sleep apnea on CPAP  Needs to resume CPAP machine  5. H/O acute myocardial infarction  Doing well  6. Dysmetabolic syndrome  - Hemoglobin A1c  7. Vitamin D deficiency  - VITAMIN D 25 Hydroxy (Vit-D Deficiency, Fractures)  8. Dyslipidemia  - Lipid panel

## 2015-02-06 NOTE — Addendum Note (Signed)
Addended by: Steele Sizer F on: 02/06/2015 09:50 AM   Modules accepted: Orders

## 2015-02-17 LAB — CBC WITH DIFFERENTIAL/PLATELET
BASOS ABS: 0 10*3/uL (ref 0.0–0.2)
Basos: 0 %
EOS (ABSOLUTE): 0.1 10*3/uL (ref 0.0–0.4)
Eos: 2 %
Hematocrit: 40.3 % (ref 37.5–51.0)
Hemoglobin: 13.7 g/dL (ref 12.6–17.7)
IMMATURE GRANS (ABS): 0 10*3/uL (ref 0.0–0.1)
IMMATURE GRANULOCYTES: 0 %
LYMPHS: 33 %
Lymphocytes Absolute: 2 10*3/uL (ref 0.7–3.1)
MCH: 33.3 pg — ABNORMAL HIGH (ref 26.6–33.0)
MCHC: 34 g/dL (ref 31.5–35.7)
MCV: 98 fL — ABNORMAL HIGH (ref 79–97)
MONOS ABS: 0.6 10*3/uL (ref 0.1–0.9)
Monocytes: 11 %
NEUTROS PCT: 54 %
Neutrophils Absolute: 3.3 10*3/uL (ref 1.4–7.0)
PLATELETS: 278 10*3/uL (ref 150–379)
RBC: 4.11 x10E6/uL — ABNORMAL LOW (ref 4.14–5.80)
RDW: 14.3 % (ref 12.3–15.4)
WBC: 6.1 10*3/uL (ref 3.4–10.8)

## 2015-02-17 LAB — COMPREHENSIVE METABOLIC PANEL
A/G RATIO: 1.5 (ref 1.1–2.5)
ALK PHOS: 63 IU/L (ref 39–117)
ALT: 22 IU/L (ref 0–44)
AST: 16 IU/L (ref 0–40)
Albumin: 4.1 g/dL (ref 3.5–5.5)
BUN/Creatinine Ratio: 16 (ref 9–20)
BUN: 16 mg/dL (ref 6–24)
Bilirubin Total: 0.4 mg/dL (ref 0.0–1.2)
CALCIUM: 10.2 mg/dL (ref 8.7–10.2)
CO2: 22 mmol/L (ref 18–29)
Chloride: 106 mmol/L (ref 96–106)
Creatinine, Ser: 0.98 mg/dL (ref 0.76–1.27)
GFR calc Af Amer: 100 mL/min/{1.73_m2} (ref >59)
GFR calc non Af Amer: 86 mL/min/{1.73_m2} (ref >59)
GLOBULIN, TOTAL: 2.8 g/dL (ref 1.5–4.5)
Glucose: 107 mg/dL — ABNORMAL HIGH (ref 65–99)
POTASSIUM: 4.4 mmol/L (ref 3.5–5.2)
Sodium: 142 mmol/L (ref 134–144)
Total Protein: 6.9 g/dL (ref 6.0–8.5)

## 2015-02-17 LAB — LIPID PANEL
CHOLESTEROL TOTAL: 134 mg/dL (ref 100–199)
Chol/HDL Ratio: 2.8 ratio units (ref 0.0–5.0)
HDL: 48 mg/dL (ref >39)
LDL Calculated: 77 mg/dL (ref 0–99)
Triglycerides: 43 mg/dL (ref 0–149)
VLDL Cholesterol Cal: 9 mg/dL (ref 5–40)

## 2015-02-17 LAB — HEMOGLOBIN A1C
Est. average glucose Bld gHb Est-mCnc: 123 mg/dL
Hgb A1c MFr Bld: 5.9 % — ABNORMAL HIGH (ref 4.8–5.6)

## 2015-02-17 LAB — VITAMIN D 25 HYDROXY (VIT D DEFICIENCY, FRACTURES): Vit D, 25-Hydroxy: 18.5 ng/mL — ABNORMAL LOW (ref 30.0–100.0)

## 2015-02-20 ENCOUNTER — Other Ambulatory Visit: Payer: Self-pay | Admitting: Family Medicine

## 2015-02-20 DIAGNOSIS — R7989 Other specified abnormal findings of blood chemistry: Secondary | ICD-10-CM

## 2015-02-20 MED ORDER — VITAMIN D (ERGOCALCIFEROL) 1.25 MG (50000 UNIT) PO CAPS: 50000.0000 [IU] | ORAL_CAPSULE | ORAL | Status: DC

## 2015-02-22 LAB — SPECIMEN STATUS REPORT

## 2015-02-22 LAB — VITAMIN B12: Vitamin B-12: 613 pg/mL (ref 211–946)

## 2015-03-08 ENCOUNTER — Other Ambulatory Visit: Payer: Self-pay | Admitting: Family Medicine

## 2015-03-08 DIAGNOSIS — Z1211 Encounter for screening for malignant neoplasm of colon: Secondary | ICD-10-CM

## 2015-03-20 ENCOUNTER — Other Ambulatory Visit: Payer: Self-pay | Admitting: Family Medicine

## 2015-03-20 ENCOUNTER — Encounter: Payer: Self-pay | Admitting: Family Medicine

## 2015-03-20 MED ORDER — AMLODIPINE BESYLATE-VALSARTAN 10-320 MG PO TABS: 1.0000 | ORAL_TABLET | Freq: Every day | ORAL | Status: DC

## 2015-04-25 ENCOUNTER — Encounter: Payer: Self-pay | Admitting: Family Medicine

## 2015-04-26 ENCOUNTER — Other Ambulatory Visit: Payer: Self-pay | Admitting: Family Medicine

## 2015-04-26 DIAGNOSIS — I2510 Atherosclerotic heart disease of native coronary artery without angina pectoris: Secondary | ICD-10-CM

## 2015-04-26 MED ORDER — CARVEDILOL 12.5 MG PO TABS: 12.5000 mg | ORAL_TABLET | Freq: Two times a day (BID) | ORAL | Status: DC

## 2015-06-25 ENCOUNTER — Ambulatory Visit (INDEPENDENT_AMBULATORY_CARE_PROVIDER_SITE_OTHER): Payer: Federal, State, Local not specified - PPO | Admitting: Family Medicine

## 2015-06-25 ENCOUNTER — Encounter: Payer: Self-pay | Admitting: Family Medicine

## 2015-06-25 VITALS — BP 150/92 | HR 72 | Temp 98.2°F | Resp 16 | Wt 207.0 lb

## 2015-06-25 DIAGNOSIS — I10 Essential (primary) hypertension: Secondary | ICD-10-CM

## 2015-06-25 DIAGNOSIS — J069 Acute upper respiratory infection, unspecified: Secondary | ICD-10-CM | POA: Diagnosis not present

## 2015-06-25 DIAGNOSIS — J40 Bronchitis, not specified as acute or chronic: Secondary | ICD-10-CM | POA: Diagnosis not present

## 2015-06-25 MED ORDER — MUCINEX DM MAXIMUM STRENGTH 60-1200 MG PO TB12: 1.0000 | ORAL_TABLET | Freq: Two times a day (BID) | ORAL | Status: DC

## 2015-06-25 MED ORDER — FLUTICASONE FUROATE-VILANTEROL 100-25 MCG/INH IN AEPB: 1.0000 | INHALATION_SPRAY | Freq: Every day | RESPIRATORY_TRACT | Status: DC

## 2015-06-25 MED ORDER — AZITHROMYCIN 250 MG PO TABS: ORAL_TABLET | ORAL | Status: DC

## 2015-06-25 NOTE — Progress Notes (Signed)
Name: Roberto Walker   MRN: [id number]    DOB: [birth year]   Date:06/25/2015       Progress Note  Subjective  Chief Complaint  Chief Complaint  Patient presents with  . Cough    patient presents with deep, dry hack cough for about a week. no other sx.    HPI  Bronchitis: he states that about one week ago, initially had rhinorrhea, nasal congestion, over the past few days he has noticed chest congestion, productive cough , white in color, no SOB but noticed some wheezing. Denies orthopnea or fever. Taking otc Robitussin DM  HTN: bp is elevated today, usually at goal, he is taking otc medications for cold, no chest pain or palpitation   Patient Active Problem List   Diagnosis Date Noted  . Personal history of kidney cancer 02/06/2015  . Benign essential HTN 08/02/2014  . Arteriosclerosis of coronary artery 08/02/2014  . Dyslipidemia 08/02/2014  . H/O acute myocardial infarction 08/02/2014  . 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/02/2014  . Dysmetabolic syndrome [id number]  . Arthritis, degenerative 08/02/2014  . Peripheral blood vessel disorder ([place]) 08/02/2014  . Blood glucose elevated 08/02/2014  . Obstructive sleep apnea on CPAP 08/02/2014  . Vitamin D deficiency 08/02/2014  . Vitiligo 08/02/2014  . Malignant neoplasm of prostate ([place]) 11/30/2012    Past Surgical History  Procedure Laterality Date  . Left heart cath  01/11/2012  . Beam radiation      Prostate using Photons  . Nephrectomy Right 2010    Partial  . Iliac vein angioplasty / stenting      Family History  Problem Relation Age of Onset  . Hypertension Mother   . Lung cancer Father   . Hypertension Father   . Hyperlipidemia Father   . Diabetes Sister     oldest sister  . Hypertension Sister   . Hypertension Sister     Social History   Social History  . Marital Status: Married    Spouse Name: Mateo Flow  . Number of Children: 2  . Years of Education: College     Occupational History  . [employer]    Social History Main Topics  . Smoking status: Former Smoker -- 20 years    Types: Cigarettes    Quit date: 01/12/2012  . Smokeless tobacco: Not on file  . Alcohol Use: 0.0 oz/week    0 Standard drinks or equivalent per week     Comment: Minimal alcohol consumption  . Drug Use: No  . Sexual Activity:    Partners: Female   Other Topics Concern  . Not on file   Social History Narrative     Current outpatient prescriptions:  .  amLODipine-valsartan (EXFORGE) 10-320 MG tablet, Take 1 tablet by mouth daily., Disp: 90 tablet, Rfl: 1 .  aspirin 81 MG tablet, Take 1 tablet by mouth daily., Disp: , Rfl:  .  atorvastatin (LIPITOR) 80 MG tablet, Take 1 tablet (80 mg total) by mouth daily., Disp: 90 tablet, Rfl: 1 .  carvedilol (COREG) 12.5 MG tablet, Take 1 tablet (12.5 mg total) by mouth 2 (two) times daily., Disp: 180 tablet, Rfl: 1 .  cholecalciferol (VITAMIN D) 1000 UNITS tablet, Take 1 tablet by mouth daily., Disp: , Rfl:  .  clopidogrel (PLAVIX) 75 MG tablet, Take 1 tablet (75 mg total) by mouth daily., Disp: 90 tablet, Rfl: 1 .  Naproxen Sodium (ALEVE) 220 MG CAPS, Take 1 tablet by mouth as needed., Disp: ,  Rfl:  .  nitroGLYCERIN (NITROSTAT) 0.4 MG SL tablet, Place 1 tablet (0.4 mg total) under the tongue daily., Disp: 30 tablet, Rfl: 0 .  sildenafil (VIAGRA) 50 MG tablet, Take 1 tablet by mouth as needed., Disp: , Rfl:  .  Vitamin D, Ergocalciferol, (DRISDOL) 50000 units CAPS capsule, Take 1 capsule (50,000 Units total) by mouth every 7 (seven) days., Disp: 12 capsule, Rfl: 0  Allergies  Allergen Reactions  . Lisinopril Swelling    Swelling around mouth, lips     ROS  Constitutional: Negative for fever or weight change.  Respiratory: Positive  for cough no  shortness of breath.   Cardiovascular: Negative for chest pain or palpitations.  Gastrointestinal: Negative for abdominal pain, no bowel changes.  Musculoskeletal: Negative for gait  problem or joint swelling.  Skin: Negative for rash.  Neurological: Negative for dizziness or headache.  No other specific complaints in a complete review of systems (except as listed in HPI above).  Objective  Filed Vitals:   06/25/15 0817 06/25/15 0820  BP: 162/98 150/92  Pulse: 72   Temp: 98.2 F (36.8 C)   TempSrc: Oral   Resp: 16   Weight: 207 lb (93.895 kg)   SpO2: 98%     Body mass index is 28.07 kg/(m^2).  Physical Exam  Constitutional: Patient appears well-developed and well-nourished. Obese  No distress.  HEENT: head atraumatic, normocephalic, pupils equal and reactive to light, ear TM, neck supple, throat within normal limits Cardiovascular: Normal rate, regular rhythm and normal heart sounds.  No murmur heard. No BLE edema. Pulmonary/Chest: Effort normal and breath sounds normal. No respiratory distress. Abdominal: Soft.  There is no tenderness. Psychiatric: Patient has a normal mood and affect. behavior is normal. Judgment and thought content normal.  PHQ2/9: Depression screen [facility] 2/9 06/25/2015 02/06/2015 08/04/2014  Decreased Interest 0 0 0  Down, Depressed, Hopeless 0 0 0  PHQ - 2 Score 0 0 0    Fall Risk: Fall Risk  06/25/2015 02/06/2015 08/04/2014  Falls in the past year? No No No    Functional Status Survey: Is the patient deaf or have difficulty hearing?: No Does the patient have difficulty seeing, even when wearing glasses/contacts?: No Does the patient have difficulty concentrating, remembering, or making decisions?: No Does the patient have difficulty walking or climbing stairs?: No Does the patient have difficulty dressing or bathing?: No Does the patient have difficulty doing errands alone such as visiting a doctor's office or shopping?: No    Assessment & Plan  1. Benign essential HTN  Stop Robitussin, only take Coricidin HBP prn cold symptoms  2. Upper respiratory infection  See above, increase fluid intake  3. Bronchitis  Hold off on  filling antibiotic rx  - fluticasone furoate-vilanterol (BREO ELLIPTA) 100-25 MCG/INH AEPB; Inhale 1 puff into the lungs daily.  Dispense: 60 each; Refill: 0 - azithromycin (ZITHROMAX Z-PAK) 250 MG tablet; Take as directed  Dispense: 6 each; Refill: 0 - Dextromethorphan-Guaifenesin (MUCINEX DM MAXIMUM STRENGTH) 60-1200 MG TB12; Take 1 tablet by mouth 2 (two) times daily.  Dispense: 28 each; Refill: 0

## 2015-06-25 NOTE — Patient Instructions (Signed)
Only cold medication you can take is Coricidin HBP

## 2015-08-02 ENCOUNTER — Ambulatory Visit (INDEPENDENT_AMBULATORY_CARE_PROVIDER_SITE_OTHER): Payer: Federal, State, Local not specified - PPO | Admitting: Family Medicine

## 2015-08-02 ENCOUNTER — Encounter: Payer: Self-pay | Admitting: Family Medicine

## 2015-08-02 VITALS — BP 122/80 | HR 62 | Temp 98.8°F | Resp 12 | Wt 209.3 lb

## 2015-08-02 DIAGNOSIS — G4733 Obstructive sleep apnea (adult) (pediatric): Secondary | ICD-10-CM | POA: Diagnosis not present

## 2015-08-02 DIAGNOSIS — I739 Peripheral vascular disease, unspecified: Secondary | ICD-10-CM | POA: Diagnosis not present

## 2015-08-02 DIAGNOSIS — E559 Vitamin D deficiency, unspecified: Secondary | ICD-10-CM

## 2015-08-02 DIAGNOSIS — E8881 Metabolic syndrome: Secondary | ICD-10-CM | POA: Diagnosis not present

## 2015-08-02 DIAGNOSIS — Z1159 Encounter for screening for other viral diseases: Secondary | ICD-10-CM | POA: Diagnosis not present

## 2015-08-02 DIAGNOSIS — N529 Male erectile dysfunction, unspecified: Secondary | ICD-10-CM

## 2015-08-02 DIAGNOSIS — R7989 Other specified abnormal findings of blood chemistry: Secondary | ICD-10-CM

## 2015-08-02 DIAGNOSIS — E785 Hyperlipidemia, unspecified: Secondary | ICD-10-CM

## 2015-08-02 DIAGNOSIS — I2510 Atherosclerotic heart disease of native coronary artery without angina pectoris: Secondary | ICD-10-CM | POA: Diagnosis not present

## 2015-08-02 DIAGNOSIS — I10 Essential (primary) hypertension: Secondary | ICD-10-CM | POA: Diagnosis not present

## 2015-08-02 DIAGNOSIS — Z9989 Dependence on other enabling machines and devices: Secondary | ICD-10-CM

## 2015-08-02 MED ORDER — SILDENAFIL CITRATE 20 MG PO TABS: 20.0000 mg | ORAL_TABLET | Freq: Every day | ORAL | Status: DC | PRN

## 2015-08-02 NOTE — Addendum Note (Signed)
Addended by: Johnnette Litter A on: 08/02/2015 03:01 PM   Modules accepted: Miquel Dunn

## 2015-08-02 NOTE — Progress Notes (Signed)
Name: Roberto Walker   MRN: [id number]    DOB: 03/06/1960   Date:08/02/2015       Progress Note  Subjective  Chief Complaint  Chief Complaint  Patient presents with  . Hypertension    patient is here for his 37-month f/u and stated he has not had any neg sx  . Apnea  . Dyslipidemia    HPI  CAD: he had an MI back in 2013, s/p stent placement, he has been asymptomatic since event. He sees [facility] Terance Hart at [facility] once year. Taking Aspirin and Plavix, he denies easy bruising or bleeding. No chest pain , he is very active at work, he is walking about 4000 steps per day, but will resume going to the gym and use treadmill and exercise bike.   HTN: bp is at goal, taking medication, no side effects of medication. No chest pain or palpitation   Hyperlipidemia: taking Atorvastatin, and is compliant , denies myalgia. Reviewed labs with patient  OSA: he is not compliant with CPAP. He states he has been sleeping with head of the bed elevated and is not snoring as much, also waking up feeling rested. Explained importance of CPAP use to decrease risk of heart attacks or strokes.   History of kidney and prostate cancer : he is going to [facility] to check on prostate once every 6 months. S/p radiation . January 2016 - he has a follow up in August - Dr. Bridgett Larsson - PSA is gradually improving.  PVD: sees Dr. Lucky Cowboy once a year, no claudication, no leg swelling.   Colon Cancer screen: he is scared of having a colonoscopy done. He had too many cancers.   Pre-diabetes: discussed life style modification, he denies polyphagia, polydipsia or polyuria  ED: he has difficulty initiating and maintaining erection since prostate radiation therapy, he uses Viagra prn and needs a refill. He is aware of risk of hypotension with NTG.  Patient Active Problem List   Diagnosis Date Noted  . Personal history of kidney cancer 02/06/2015  . Benign essential HTN 08/02/2014  . Arteriosclerosis of coronary artery 08/02/2014  .  Dyslipidemia 08/02/2014  . H/O acute myocardial infarction 08/02/2014  . 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/02/2014  . Dysmetabolic syndrome [id number]  . Arthritis, degenerative 08/02/2014  . Peripheral blood vessel disorder ([place]) 08/02/2014  . Blood glucose elevated 08/02/2014  . Obstructive sleep apnea on CPAP 08/02/2014  . Vitamin D deficiency 08/02/2014  . Vitiligo 08/02/2014  . Malignant neoplasm of prostate ([place]) 11/30/2012    Past Surgical History  Procedure Laterality Date  . Left heart cath  01/11/2012  . Beam radiation      Prostate using Photons  . Nephrectomy Right 2010    Partial  . Iliac vein angioplasty / stenting      Family History  Problem Relation Age of Onset  . Hypertension Mother   . Lung cancer Father   . Hypertension Father   . Hyperlipidemia Father   . Diabetes Sister     oldest sister  . Hypertension Sister   . Hypertension Sister     Social History   Social History  . Marital Status: Married    Spouse Name: Mateo Flow  . Number of Children: 2  . Years of Education: College    Occupational History  . [employer]    Social History Main Topics  . Smoking status: Former Smoker -- 20 years    Types: Cigarettes    Quit date:  01/12/2012  . Smokeless tobacco: Not on file  . Alcohol Use: 0.0 oz/week    0 Standard drinks or equivalent per week     Comment: Minimal alcohol consumption  . Drug Use: No  . Sexual Activity:    Partners: Female   Other Topics Concern  . Not on file   Social History Narrative     Current outpatient prescriptions:  .  amLODipine-valsartan (EXFORGE) 10-320 MG tablet, Take 1 tablet by mouth daily., Disp: 90 tablet, Rfl: 1 .  aspirin 81 MG tablet, Take 1 tablet by mouth daily., Disp: , Rfl:  .  atorvastatin (LIPITOR) 80 MG tablet, Take 1 tablet (80 mg total) by mouth daily., Disp: 90 tablet, Rfl: 1 .  carvedilol (COREG) 12.5 MG tablet, Take 1 tablet (12.5 mg total) by mouth 2 (two) times  daily., Disp: 180 tablet, Rfl: 1 .  cholecalciferol (VITAMIN D) 1000 UNITS tablet, Take 1 tablet by mouth daily., Disp: , Rfl:  .  clopidogrel (PLAVIX) 75 MG tablet, Take 1 tablet (75 mg total) by mouth daily., Disp: 90 tablet, Rfl: 1 .  nitroGLYCERIN (NITROSTAT) 0.4 MG SL tablet, Place 1 tablet (0.4 mg total) under the tongue daily., Disp: 30 tablet, Rfl: 0 .  sildenafil (REVATIO) 20 MG tablet, Take 1 tablet (20 mg total) by mouth daily as needed., Disp: 50 tablet, Rfl: 2  Allergies  Allergen Reactions  . Lisinopril Swelling    Swelling around mouth, lips     ROS  Constitutional: Negative for fever or weight change.  Respiratory: Negative for cough and shortness of breath.   Cardiovascular: Negative for chest pain or palpitations.  Gastrointestinal: Negative for abdominal pain, no bowel changes.  Musculoskeletal: Negative for gait problem or joint swelling.  Skin: Negative for rash.  Neurological: Negative for dizziness or headache.  No other specific complaints in a complete review of systems (except as listed in HPI above).  Objective  Filed Vitals:   08/02/15 0759  BP: 122/80  Pulse: 62  Temp: 98.8 F (37.1 C)  TempSrc: Oral  Resp: 12  Weight: 209 lb 4.8 oz (94.938 kg)  SpO2: 97%    Body mass index is 28.38 kg/(m^2).  Physical Exam  Constitutional: Patient appears well-developed and well-nourished.  No distress.  HEENT: head atraumatic, normocephalic, pupils equal and reactive to light,  neck supple, throat within normal limits Cardiovascular: Normal rate, regular rhythm and normal heart sounds.  No murmur heard. No BLE edema. Pulmonary/Chest: Effort normal and breath sounds normal. No respiratory distress. Abdominal: Soft.  There is no tenderness. Psychiatric: Patient has a normal mood and affect. behavior is normal. Judgment and thought content normal.  PHQ2/9: Depression screen [facility] 2/9 08/02/2015 06/25/2015 02/06/2015 08/04/2014  Decreased Interest 0 0 0 0  Down,  Depressed, Hopeless 0 0 0 0  PHQ - 2 Score 0 0 0 0     Fall Risk: Fall Risk  08/02/2015 06/25/2015 02/06/2015 08/04/2014  Falls in the past year? No No No No     Functional Status Survey: Is the patient deaf or have difficulty hearing?: No Does the patient have difficulty seeing, even when wearing glasses/contacts?: No Does the patient have difficulty concentrating, remembering, or making decisions?: No Does the patient have difficulty walking or climbing stairs?: No Does the patient have difficulty dressing or bathing?: No Does the patient have difficulty doing errands alone such as visiting a doctor's office or shopping?: No    Assessment & Plan  1. Benign essential HTN  -  Comprehensive metabolic panel  2. Arteriosclerosis of coronary artery  Continue medication , doing well   3. Dyslipidemia  Continue Lipitor   4. Dysmetabolic syndrome  - Hemoglobin A1c   5. Vitamin D deficiency  - VITAMIN D 25 Hydroxy (Vit-D Deficiency, Fractures)  6. Peripheral blood vessel disorder (HCC)  Doing well at this time  7. Obstructive sleep apnea on CPAP  Explained importance of using CPAP   8. Impotence of organic origin  - sildenafil (REVATIO) 20 MG tablet; Take 1 tablet (20 mg total) by mouth daily as needed.  Dispense: 50 tablet; Refill: 2  9. Abnormal CBC  - CBC with Differential/Platelet - Folate  10. Need for hepatitis C screening test  - Hepatitis C antibodyantibody

## 2015-08-02 NOTE — Addendum Note (Signed)
Addended by: Johnnette Litter A on: 08/02/2015 02:50 PM   Modules accepted: Miquel Dunn

## 2015-08-02 NOTE — Addendum Note (Signed)
Addended by: Johnnette Litter A on: 08/02/2015 02:45 PM   Modules accepted: Miquel Dunn

## 2015-08-02 NOTE — Addendum Note (Signed)
Addended by: Johnnette Litter A on: 08/02/2015 02:54 PM   Modules accepted: Miquel Dunn

## 2015-08-03 LAB — COMPREHENSIVE METABOLIC PANEL
A/G RATIO: 1.4 (ref 1.2–2.2)
ALBUMIN: 4.2 g/dL (ref 3.5–5.5)
ALK PHOS: 60 IU/L (ref 39–117)
ALT: 32 IU/L (ref 0–44)
AST: 24 IU/L (ref 0–40)
BILIRUBIN TOTAL: 0.3 mg/dL (ref 0.0–1.2)
BUN / CREAT RATIO: 15 (ref 9–20)
BUN: 15 mg/dL (ref 6–24)
CHLORIDE: 104 mmol/L (ref 96–106)
CO2: 21 mmol/L (ref 18–29)
Calcium: 10.4 mg/dL — ABNORMAL HIGH (ref 8.7–10.2)
Creatinine, Ser: 0.97 mg/dL (ref 0.76–1.27)
GFR calc non Af Amer: 88 mL/min/{1.73_m2} (ref >59)
GFR, EST AFRICAN AMERICAN: 101 mL/min/{1.73_m2} (ref >59)
GLUCOSE: 103 mg/dL — AB (ref 65–99)
Globulin, Total: 3 g/dL (ref 1.5–4.5)
POTASSIUM: 4.5 mmol/L (ref 3.5–5.2)
Sodium: 141 mmol/L (ref 134–144)
TOTAL PROTEIN: 7.2 g/dL (ref 6.0–8.5)

## 2015-08-03 LAB — CBC WITH DIFFERENTIAL/PLATELET
BASOS ABS: 0 10*3/uL (ref 0.0–0.2)
BASOS: 0 %
EOS (ABSOLUTE): 0.2 10*3/uL (ref 0.0–0.4)
Eos: 3 %
HEMOGLOBIN: 13.7 g/dL (ref 12.6–17.7)
Hematocrit: 42.1 % (ref 37.5–51.0)
IMMATURE GRANS (ABS): 0 10*3/uL (ref 0.0–0.1)
Immature Granulocytes: 0 %
LYMPHS ABS: 2.8 10*3/uL (ref 0.7–3.1)
Lymphs: 40 %
MCH: 31.7 pg (ref 26.6–33.0)
MCHC: 32.5 g/dL (ref 31.5–35.7)
MCV: 98 fL — AB (ref 79–97)
Monocytes Absolute: 0.6 10*3/uL (ref 0.1–0.9)
Monocytes: 8 %
NEUTROS ABS: 3.4 10*3/uL (ref 1.4–7.0)
Neutrophils: 49 %
PLATELETS: 283 10*3/uL (ref 150–379)
RBC: 4.32 x10E6/uL (ref 4.14–5.80)
RDW: 14.6 % (ref 12.3–15.4)
WBC: 7 10*3/uL (ref 3.4–10.8)

## 2015-08-03 LAB — VITAMIN D 25 HYDROXY (VIT D DEFICIENCY, FRACTURES): VIT D 25 HYDROXY: 40.6 ng/mL (ref 30.0–100.0)

## 2015-08-03 LAB — FOLATE: FOLATE: 8.7 ng/mL (ref >3.0)

## 2015-08-03 LAB — HEMOGLOBIN A1C
Est. average glucose Bld gHb Est-mCnc: 120 mg/dL
HEMOGLOBIN A1C: 5.8 % — AB (ref 4.8–5.6)

## 2015-08-10 ENCOUNTER — Ambulatory Visit: Payer: TRICARE For Life (TFL) | Admitting: Family Medicine

## 2015-09-27 DIAGNOSIS — C61 Malignant neoplasm of prostate: Secondary | ICD-10-CM | POA: Diagnosis not present

## 2015-10-19 ENCOUNTER — Encounter (INDEPENDENT_AMBULATORY_CARE_PROVIDER_SITE_OTHER): Payer: Self-pay

## 2015-10-22 ENCOUNTER — Other Ambulatory Visit: Payer: Self-pay | Admitting: Family Medicine

## 2015-10-22 ENCOUNTER — Encounter: Payer: Self-pay | Admitting: Family Medicine

## 2015-10-22 DIAGNOSIS — E785 Hyperlipidemia, unspecified: Secondary | ICD-10-CM

## 2015-10-22 DIAGNOSIS — I2510 Atherosclerotic heart disease of native coronary artery without angina pectoris: Secondary | ICD-10-CM

## 2015-10-22 MED ORDER — ATORVASTATIN CALCIUM 80 MG PO TABS: 80.0000 mg | ORAL_TABLET | Freq: Every day | ORAL | 1 refills | Status: DC

## 2015-10-31 ENCOUNTER — Other Ambulatory Visit: Payer: Self-pay | Admitting: Family Medicine

## 2015-10-31 ENCOUNTER — Encounter: Payer: Self-pay | Admitting: Family Medicine

## 2015-10-31 DIAGNOSIS — I2510 Atherosclerotic heart disease of native coronary artery without angina pectoris: Secondary | ICD-10-CM

## 2015-10-31 DIAGNOSIS — I739 Peripheral vascular disease, unspecified: Secondary | ICD-10-CM

## 2015-10-31 MED ORDER — CLOPIDOGREL BISULFATE 75 MG PO TABS: 75.0000 mg | ORAL_TABLET | Freq: Every day | ORAL | 1 refills | Status: DC

## 2015-11-14 ENCOUNTER — Other Ambulatory Visit (INDEPENDENT_AMBULATORY_CARE_PROVIDER_SITE_OTHER): Payer: Self-pay | Admitting: Vascular Surgery

## 2015-11-14 DIAGNOSIS — I739 Peripheral vascular disease, unspecified: Secondary | ICD-10-CM

## 2015-11-14 DIAGNOSIS — M79604 Pain in right leg: Secondary | ICD-10-CM

## 2015-11-14 DIAGNOSIS — I70219 Atherosclerosis of native arteries of extremities with intermittent claudication, unspecified extremity: Secondary | ICD-10-CM

## 2015-11-14 DIAGNOSIS — M79605 Pain in left leg: Secondary | ICD-10-CM

## 2015-11-15 ENCOUNTER — Ambulatory Visit (INDEPENDENT_AMBULATORY_CARE_PROVIDER_SITE_OTHER): Payer: Federal, State, Local not specified - PPO

## 2015-11-15 ENCOUNTER — Encounter (INDEPENDENT_AMBULATORY_CARE_PROVIDER_SITE_OTHER): Payer: Self-pay | Admitting: Vascular Surgery

## 2015-11-15 ENCOUNTER — Ambulatory Visit (INDEPENDENT_AMBULATORY_CARE_PROVIDER_SITE_OTHER): Payer: Federal, State, Local not specified - PPO | Admitting: Vascular Surgery

## 2015-11-15 VITALS — BP 128/87 | HR 56 | Resp 16 | Ht 72.0 in | Wt 201.0 lb

## 2015-11-15 DIAGNOSIS — I70211 Atherosclerosis of native arteries of extremities with intermittent claudication, right leg: Secondary | ICD-10-CM | POA: Diagnosis not present

## 2015-11-15 DIAGNOSIS — I70219 Atherosclerosis of native arteries of extremities with intermittent claudication, unspecified extremity: Secondary | ICD-10-CM

## 2015-11-15 DIAGNOSIS — M79605 Pain in left leg: Secondary | ICD-10-CM

## 2015-11-15 DIAGNOSIS — M79604 Pain in right leg: Secondary | ICD-10-CM

## 2015-11-15 DIAGNOSIS — I10 Essential (primary) hypertension: Secondary | ICD-10-CM | POA: Diagnosis not present

## 2015-11-15 DIAGNOSIS — I739 Peripheral vascular disease, unspecified: Secondary | ICD-10-CM

## 2015-11-15 DIAGNOSIS — E785 Hyperlipidemia, unspecified: Secondary | ICD-10-CM

## 2015-11-15 NOTE — Progress Notes (Signed)
Subjective:    Patient ID: Roberto Walker, male    DOB: 07/01/1960, 55 y.o.   MRN: [id number] Chief Complaint  Patient presents with  . Re-evaluation    Ultrasound follow up   Patient presents for a yearly lower extremity atherosclerotic disease follow up. The patient underwent an ABI which showed Right ABI: 1.30 and Left 1.09 (on 11/01/14, Right ABI: 1.40 and Left 1.12). An aorto-iliac arterial duplex is notable for Right: Triphasic blood flow distally / LEFT: Patent stent with biphasic blood flow through stent transitioning to triphasic distally. The patient denies any claudication like symptoms, rest pain or ulcers to his feet / toes.  The patient continues a regimen of ASA and dyslipidemia medication.  The patient does not use tobacco.  The patient denies any new health changes since last visit. Medication list reviewed.     Review of Systems  Constitutional: Negative.   HENT: Negative.   Eyes: Negative.   Respiratory: Negative.   Cardiovascular: Negative.   Gastrointestinal: Negative.   Endocrine: Negative.   Genitourinary: Negative.   Musculoskeletal: Negative.   Skin: Negative.   Allergic/Immunologic: Negative.   Neurological: Negative.   Hematological: Negative.   Psychiatric/Behavioral: Negative.   All other systems reviewed and are negative.      Objective:   Physical Exam  Constitutional: He is oriented to person, place, and time. He appears well-developed and well-nourished.  HENT:  Head: Normocephalic and atraumatic.  Eyes: Conjunctivae and EOM are normal. Pupils are equal, round, and reactive to light.  Neck: Normal range of motion.  Cardiovascular: Normal rate, regular rhythm, normal heart sounds and intact distal pulses.   Pulses:      Dorsalis pedis pulses are 2+ on the right side, and 2+ on the left side.       Posterior tibial pulses are 2+ on the right side, and 2+ on the left side.  Pulmonary/Chest: Effort normal and breath sounds normal.    Abdominal: Soft. Bowel sounds are normal.  Musculoskeletal: Normal range of motion.  Neurological: He is alert and oriented to person, place, and time.  Skin: Skin is warm and dry.  Psychiatric: He has a normal mood and affect. His behavior is normal. Judgment and thought content normal.   BP 128/87 (BP Location: Right Arm)   Pulse (!) 56   Resp 16   Ht 6' (1.829 m)   Wt 201 lb (91.2 kg)   BMI 27.26 kg/m   Past Medical History:  Diagnosis Date  . Hypercalcemia   . Hyperlipidemia   . Hypertension   . Metabolic syndrome   . Microscopic hematuria   . Osteoarthrosis   . Peripheral vascular disease ([place])   . Prostate cancer ([place])   . Renal cell carcinoma ([place])   . Vitamin D deficiency   . Vitiligo    Social History   Social History  . Marital status: Married    Spouse name: Mateo Flow  . Number of children: 2  . 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 Smoking status: Former Smoker    Years: 20.00    Types: Cigarettes    Quit date: 01/12/2012  . Smokeless tobacco: Never Used  . Alcohol use 0.0 oz/week     Comment: Minimal alcohol consumption  . Drug use: No  . Sexual activity: Yes    Partners: Female   Other Topics Concern  . Not on file  Social History Narrative  . No narrative on file   Past Surgical History:  Procedure Laterality Date  . Beam Radiation     Prostate using Photons  . ILIAC VEIN ANGIOPLASTY / STENTING    . LEFT HEART CATH  01/11/2012  . NEPHRECTOMY Right 2010   Partial   Family History  Problem Relation Age of Onset  . Hypertension Mother   . Lung cancer Father   . Hypertension Father   . Hyperlipidemia Father   . Diabetes Sister     oldest sister  . Hypertension Sister   . Hypertension Sister    Allergies  Allergen Reactions  . Lisinopril Swelling    Swelling around mouth, lips      Assessment & Plan:  Patient presents for a yearly lower extremity atherosclerotic disease  follow up. The patient underwent an ABI which showed Right ABI: 1.30 and Left 1.09 (on 11/01/14, Right ABI: 1.40 and Left 1.12). An aorto-iliac arterial duplex is notable for Right: Triphasic blood flow distally / LEFT: Patent stent with biphasic blood flow through stent transitioning to triphasic distally. The patient denies any claudication like symptoms, rest pain or ulcers to his feet / toes.  The patient continues a regimen of ASA and dyslipidemia medication.  The patient does not use tobacco.  The patient denies any new health changes since last visit. Medication list reviewed.  1. PAD (peripheral artery disease) (HCC) - Stable Asymptomatic with normal ABI and patent stent without significant stenosis on duplex. No intervention indicated at this time.  Patient to continue medical optimization with ASA and dyslipidemia medication. Patient to remain abstinent of tobacco use. I have discussed with the patient at length the risk factors for and pathogenesis of atherosclerotic disease and encouraged a healthy diet, regular exercise regimen and blood pressure / glucose control.  The patient was encouraged to call the office in the interim if he experiences any claudication like symptoms, rest pain or ulcers to his feet / toes.  2. Benign essential HTN - Stable Encouraged good control as its slows the progression of atherosclerotic disease  3. Dyslipidemia - Stable Patient to continue medical optimization with ASA and dyslipidemia medication. Encouraged good control as its slows the progression of atherosclerotic disease  Current Outpatient Prescriptions on File Prior to Visit  Medication Sig Dispense Refill  . amLODipine-valsartan (EXFORGE) 10-320 MG tablet Take 1 tablet by mouth daily. 90 tablet 1  . aspirin 81 MG tablet Take 1 tablet by mouth daily.    Marland Kitchen atorvastatin (LIPITOR) 80 MG tablet Take 1 tablet (80 mg total) by mouth daily. 90 tablet 1  . carvedilol (COREG) 12.5 MG tablet Take 1  tablet (12.5 mg total) by mouth 2 (two) times daily. 180 tablet 1  . cholecalciferol (VITAMIN D) 1000 UNITS tablet Take 1 tablet by mouth daily.    . clopidogrel (PLAVIX) 75 MG tablet Take 1 tablet (75 mg total) by mouth daily. 90 tablet 1  . nitroGLYCERIN (NITROSTAT) 0.4 MG SL tablet Place 1 tablet (0.4 mg total) under the tongue daily. 30 tablet 0  . sildenafil (REVATIO) 20 MG tablet Take 1 tablet (20 mg total) by mouth daily as needed. 50 tablet 2   No current facility-administered medications on file prior to visit.     There are no Patient Instructions on file for this visit. Return in about 1 year (around 11/14/2016) for Yearly PAD Follow up with ABI and Aorto-Iliac.   Olamide Lahaie A Jadyn Barge, PA-C

## 2015-11-27 ENCOUNTER — Encounter: Payer: Self-pay | Admitting: Family Medicine

## 2015-11-28 ENCOUNTER — Other Ambulatory Visit: Payer: Self-pay | Admitting: Family Medicine

## 2015-11-28 MED ORDER — AMLODIPINE BESYLATE-VALSARTAN 10-320 MG PO TABS: 1.0000 | ORAL_TABLET | Freq: Every day | ORAL | 1 refills | Status: DC

## 2016-01-16 DIAGNOSIS — I2510 Atherosclerotic heart disease of native coronary artery without angina pectoris: Secondary | ICD-10-CM | POA: Diagnosis not present

## 2016-01-23 ENCOUNTER — Other Ambulatory Visit: Payer: Self-pay | Admitting: Family Medicine

## 2016-01-23 ENCOUNTER — Encounter: Payer: Self-pay | Admitting: Family Medicine

## 2016-01-23 DIAGNOSIS — I2510 Atherosclerotic heart disease of native coronary artery without angina pectoris: Secondary | ICD-10-CM

## 2016-01-23 MED ORDER — CARVEDILOL 12.5 MG PO TABS: 12.5000 mg | ORAL_TABLET | Freq: Two times a day (BID) | ORAL | 1 refills | Status: DC

## 2016-02-05 ENCOUNTER — Ambulatory Visit: Admitting: Family Medicine

## 2016-02-25 ENCOUNTER — Ambulatory Visit: Admitting: Family Medicine

## 2016-03-31 DIAGNOSIS — Z923 Personal history of irradiation: Secondary | ICD-10-CM | POA: Diagnosis not present

## 2016-03-31 DIAGNOSIS — R351 Nocturia: Secondary | ICD-10-CM | POA: Diagnosis not present

## 2016-03-31 DIAGNOSIS — C61 Malignant neoplasm of prostate: Secondary | ICD-10-CM | POA: Diagnosis not present

## 2016-03-31 DIAGNOSIS — Z08 Encounter for follow-up examination after completed treatment for malignant neoplasm: Secondary | ICD-10-CM | POA: Diagnosis not present

## 2016-03-31 DIAGNOSIS — Z8546 Personal history of malignant neoplasm of prostate: Secondary | ICD-10-CM | POA: Diagnosis not present

## 2016-03-31 DIAGNOSIS — R339 Retention of urine, unspecified: Secondary | ICD-10-CM | POA: Diagnosis not present

## 2016-05-14 DIAGNOSIS — J069 Acute upper respiratory infection, unspecified: Secondary | ICD-10-CM | POA: Diagnosis not present

## 2016-05-20 ENCOUNTER — Encounter: Payer: Self-pay | Admitting: Family Medicine

## 2016-05-20 ENCOUNTER — Ambulatory Visit (INDEPENDENT_AMBULATORY_CARE_PROVIDER_SITE_OTHER): Payer: Federal, State, Local not specified - PPO | Admitting: Family Medicine

## 2016-05-20 VITALS — BP 110/78 | HR 68 | Temp 98.5°F | Resp 16 | Ht 72.0 in | Wt 198.3 lb

## 2016-05-20 DIAGNOSIS — R739 Hyperglycemia, unspecified: Secondary | ICD-10-CM | POA: Diagnosis not present

## 2016-05-20 DIAGNOSIS — E785 Hyperlipidemia, unspecified: Secondary | ICD-10-CM

## 2016-05-20 DIAGNOSIS — G4733 Obstructive sleep apnea (adult) (pediatric): Secondary | ICD-10-CM | POA: Diagnosis not present

## 2016-05-20 DIAGNOSIS — E559 Vitamin D deficiency, unspecified: Secondary | ICD-10-CM

## 2016-05-20 DIAGNOSIS — Z9989 Dependence on other enabling machines and devices: Secondary | ICD-10-CM

## 2016-05-20 DIAGNOSIS — E8881 Metabolic syndrome: Secondary | ICD-10-CM

## 2016-05-20 DIAGNOSIS — I2510 Atherosclerotic heart disease of native coronary artery without angina pectoris: Secondary | ICD-10-CM | POA: Diagnosis not present

## 2016-05-20 DIAGNOSIS — I10 Essential (primary) hypertension: Secondary | ICD-10-CM | POA: Diagnosis not present

## 2016-05-20 DIAGNOSIS — I739 Peripheral vascular disease, unspecified: Secondary | ICD-10-CM | POA: Diagnosis not present

## 2016-05-20 MED ORDER — CLOPIDOGREL BISULFATE 75 MG PO TABS: 75.0000 mg | ORAL_TABLET | Freq: Every day | ORAL | 1 refills | Status: DC

## 2016-05-20 MED ORDER — AMLODIPINE BESYLATE-VALSARTAN 10-320 MG PO TABS: 1.0000 | ORAL_TABLET | Freq: Every day | ORAL | 1 refills | Status: DC

## 2016-05-20 MED ORDER — CARVEDILOL 12.5 MG PO TABS: 12.5000 mg | ORAL_TABLET | Freq: Two times a day (BID) | ORAL | 1 refills | Status: DC

## 2016-05-20 MED ORDER — NITROGLYCERIN 0.4 MG SL SUBL: 0.4000 mg | SUBLINGUAL_TABLET | Freq: Every day | SUBLINGUAL | 0 refills | Status: DC

## 2016-05-20 MED ORDER — ATORVASTATIN CALCIUM 80 MG PO TABS: 80.0000 mg | ORAL_TABLET | Freq: Every day | ORAL | 1 refills | Status: DC

## 2016-05-20 NOTE — Progress Notes (Signed)
Name: Roberto Walker   MRN: [id number]    DOB: November 07, 1960   Date:05/20/2016       Progress Note  Subjective  Chief Complaint  Chief Complaint  Patient presents with  . Hypertension    6 month follow up no issues  . Hyperlipidemia    no issues    HPI  CAD: he had an MI back in 2013, s/p stent placement, he has been asymptomatic since event. He sees Dr. Terance Hart at [facility] once year. Taking Aspirin and Plavix, he denies easy bruising or bleeding. No chest pain , he is very active at work, he is walking about 4000 steps per day. He needs a refill of NTG  HTN: bp is at goal, taking medication, no side effects of medication. No chest pain or palpitation.   Hyperlipidemia: taking Atorvastatin, and is compliant , denies myalgia. Reviewed labs with patient  OSA: he is not compliant with CPAP. He states he has been sleeping with head of the bed elevated and is not snoring as much, also waking up feeling rested. Explained importance of CPAP use to decrease risk of heart attacks or strokes.   History of kidney and prostate cancer : he is going to [facility] to check on prostate once every 6 months. S/p radiation . January 2016 - he has a follow up in August - Dr. Bridgett Larsson - PSA is gradually improving.  PVD: sees Dr. Lucky Cowboy once a year, no claudication, no leg swelling.   Pre-diabetes: discussed life style modification, he denies polyphagia, polydipsia or polyuria. We will recheck labs  ED: he has difficulty initiating and maintaining erection since prostate radiation therapy, he uses Viagra prn and needs a refill. He is aware of risk of hypotension with NTG.He does not need refills at this time.    Patient Active Problem List   Diagnosis Date Noted  . Personal history of kidney cancer 02/06/2015  . Benign essential HTN 08/02/2014  . Arteriosclerosis of coronary artery 08/02/2014  . Dyslipidemia 08/02/2014  . H/O acute myocardial infarction 08/02/2014  . 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 Impotence  of organic origin 08/02/2014  . Dysmetabolic syndrome 19/62/2297  . Arthritis, degenerative 08/02/2014  . Peripheral blood vessel disorder ([place]) 08/02/2014  . Blood glucose elevated 08/02/2014  . Obstructive sleep apnea on CPAP 08/02/2014  . Vitamin D deficiency 08/02/2014  . Vitiligo 08/02/2014  . Malignant neoplasm of prostate ([place]) 11/30/2012    Past Surgical History:  Procedure Laterality Date  . Beam Radiation     Prostate using Photons  . ILIAC VEIN ANGIOPLASTY / STENTING    . LEFT HEART CATH  01/11/2012  . NEPHRECTOMY Right 2010   Partial    Family History  Problem Relation Age of Onset  . Hypertension Mother   . Lung cancer Father   . Hypertension Father   . Hyperlipidemia Father   . Diabetes Sister     oldest sister  . Hypertension Sister   . Hypertension Sister     Social History   Social History  . Marital status: Married    Spouse name: Mateo Flow  . Number of children: 2  . 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 Smoking status: Former Smoker    Years: 20.00    Types: Cigarettes    Quit date: 01/12/2012  . Smokeless tobacco: Never Used  . Alcohol use 0.0 oz/week     Comment: Minimal alcohol consumption  .  Drug use: No  . Sexual activity: Yes    Partners: Female   Other Topics Concern  . Not on file   Social History Narrative  . No narrative on file     Current Outpatient Prescriptions:  .  fluticasone (FLONASE) 50 MCG/ACT nasal spray, Place into the nose., Disp: , Rfl:  .  amLODipine-valsartan (EXFORGE) 10-320 MG tablet, Take 1 tablet by mouth daily., Disp: 90 tablet, Rfl: 1 .  aspirin 81 MG tablet, Take 1 tablet by mouth daily., Disp: , Rfl:  .  atorvastatin (LIPITOR) 80 MG tablet, Take 1 tablet (80 mg total) by mouth daily., Disp: 90 tablet, Rfl: 1 .  carvedilol (COREG) 12.5 MG tablet, Take 1 tablet (12.5 mg total) by mouth 2 (two) times daily., Disp: 180 tablet, Rfl: 1 .   cholecalciferol (VITAMIN D) 1000 UNITS tablet, Take 1 tablet by mouth daily., Disp: , Rfl:  .  clopidogrel (PLAVIX) 75 MG tablet, Take 1 tablet (75 mg total) by mouth daily., Disp: 90 tablet, Rfl: 1 .  nitroGLYCERIN (NITROSTAT) 0.4 MG SL tablet, Place 1 tablet (0.4 mg total) under the tongue daily., Disp: 30 tablet, Rfl: 0 .  sildenafil (REVATIO) 20 MG tablet, Take 1 tablet (20 mg total) by mouth daily as needed., Disp: 50 tablet, Rfl: 2  Allergies  Allergen Reactions  . Lisinopril Swelling    Swelling around mouth, lips     ROS  Constitutional: Negative for fever or weight change.  Respiratory: Negative for cough and shortness of breath.   Cardiovascular: Negative for chest pain or palpitations.  Gastrointestinal: Negative for abdominal pain, no bowel changes.  Musculoskeletal: Negative for gait problem or joint swelling.  Skin: Negative for rash.  Neurological: Negative for dizziness or headache.  No other specific complaints in a complete review of systems (except as listed in HPI above).  Objective  Vitals:   05/20/16 0859  BP: 110/78  Pulse: 68  Resp: 16  Temp: 98.5 F (36.9 C)  SpO2: 98%  Weight: 198 lb 5 oz (90 kg)  Height: 6' (1.829 m)    Body mass index is 26.9 kg/m.  Physical Exam  Constitutional: Patient appears well-developed and well-nourished. No distress.  HEENT: head atraumatic, normocephalic, pupils equal and reactive to light,  neck supple, throat within normal limits Cardiovascular: Normal rate, regular rhythm and normal heart sounds.  No murmur heard. Trace  BLE edema. Pulmonary/Chest: Effort normal and breath sounds normal. No respiratory distress. Abdominal: Soft.  There is no tenderness. Psychiatric: Patient has a normal mood and affect. behavior is normal. Judgment and thought content normal.  PHQ2/9: Depression screen [facility] 2/9 05/20/2016 08/02/2015 06/25/2015 02/06/2015 08/04/2014  Decreased Interest 0 0 0 0 0  Down, Depressed, Hopeless 0 0 0 0 0   PHQ - 2 Score 0 0 0 0 0     Fall Risk: Fall Risk  05/20/2016 08/02/2015 06/25/2015 02/06/2015 08/04/2014  Falls in the past year? No No No No No     Assessment & Plan  1. Benign essential HTN  - amLODipine-valsartan (EXFORGE) 10-320 MG tablet; Take 1 tablet by mouth daily.  Dispense: 90 tablet; Refill: 1 - carvedilol (COREG) 12.5 MG tablet; Take 1 tablet (12.5 mg total) by mouth 2 (two) times daily.  Dispense: 180 tablet; Refill: 1 - CBC with Differential/Platelet - COMPLETE METABOLIC PANEL WITH GFR  2. Arteriosclerosis of coronary artery  - atorvastatin (LIPITOR) 80 MG tablet; Take 1 tablet (80 mg total) by mouth daily.  Dispense: 90 tablet;  Refill: 1 - carvedilol (COREG) 12.5 MG tablet; Take 1 tablet (12.5 mg total) by mouth 2 (two) times daily.  Dispense: 180 tablet; Refill: 1 - clopidogrel (PLAVIX) 75 MG tablet; Take 1 tablet (75 mg total) by mouth daily.  Dispense: 90 tablet; Refill: 1 - nitroGLYCERIN (NITROSTAT) 0.4 MG SL tablet; Place 1 tablet (0.4 mg total) under the tongue daily.  Dispense: 30 tablet; Refill: 0  3. Dyslipidemia  - atorvastatin (LIPITOR) 80 MG tablet; Take 1 tablet (80 mg total) by mouth daily.  Dispense: 90 tablet; Refill: 1 - Lipid panel  4. Dysmetabolic syndrome   5. Vitamin D deficiency  Continue supplementation   6. Peripheral blood vessel disorder (HCC)  - clopidogrel (PLAVIX) 75 MG tablet; Take 1 tablet (75 mg total) by mouth daily.  Dispense: 90 tablet; Refill: 1  7. Obstructive sleep apnea on CPAP  He stopped using CPAP over one year ago, could not get use to it, he does not want to take medications to sleep and be able to wear the mask.   8. Hyperglycemia  - Hemoglobin A1c - Insulin, fasting

## 2016-06-06 ENCOUNTER — Other Ambulatory Visit: Payer: Self-pay | Admitting: Family Medicine

## 2016-06-06 DIAGNOSIS — E785 Hyperlipidemia, unspecified: Secondary | ICD-10-CM | POA: Diagnosis not present

## 2016-06-06 DIAGNOSIS — I10 Essential (primary) hypertension: Secondary | ICD-10-CM | POA: Diagnosis not present

## 2016-06-06 DIAGNOSIS — R739 Hyperglycemia, unspecified: Secondary | ICD-10-CM | POA: Diagnosis not present

## 2016-06-06 LAB — CBC WITH DIFFERENTIAL/PLATELET
BASOS PCT: 0 %
Basophils Absolute: 0 cells/uL (ref 0–200)
EOS ABS: 122 {cells}/uL (ref 15–500)
EOS PCT: 2 %
HCT: 39 % (ref 38.5–50.0)
HEMOGLOBIN: 13 g/dL — AB (ref 13.2–17.1)
LYMPHS ABS: 2562 {cells}/uL (ref 850–3900)
Lymphocytes Relative: 42 %
MCH: 32.7 pg (ref 27.0–33.0)
MCHC: 33.3 g/dL (ref 32.0–36.0)
MCV: 98.2 fL (ref 80.0–100.0)
MONOS PCT: 8 %
MPV: 8.5 fL (ref 7.5–12.5)
Monocytes Absolute: 488 cells/uL (ref 200–950)
NEUTROS ABS: 2928 {cells}/uL (ref 1500–7800)
Neutrophils Relative %: 48 %
PLATELETS: 244 10*3/uL (ref 140–400)
RBC: 3.97 MIL/uL — ABNORMAL LOW (ref 4.20–5.80)
RDW: 15 % (ref 11.0–15.0)
WBC: 6.1 10*3/uL (ref 3.8–10.8)

## 2016-06-07 LAB — COMPLETE METABOLIC PANEL WITH GFR
ALT: 23 U/L (ref 9–46)
AST: 18 U/L (ref 10–35)
Albumin: 3.9 g/dL (ref 3.6–5.1)
Alkaline Phosphatase: 52 U/L (ref 40–115)
BILIRUBIN TOTAL: 0.4 mg/dL (ref 0.2–1.2)
BUN: 11 mg/dL (ref 7–25)
CO2: 22 mmol/L (ref 20–31)
Calcium: 9.7 mg/dL (ref 8.6–10.3)
Chloride: 109 mmol/L (ref 98–110)
Creat: 0.82 mg/dL (ref 0.70–1.33)
Glucose, Bld: 96 mg/dL (ref 65–99)
POTASSIUM: 4 mmol/L (ref 3.5–5.3)
SODIUM: 141 mmol/L (ref 135–146)
TOTAL PROTEIN: 6.6 g/dL (ref 6.1–8.1)

## 2016-06-07 LAB — HEMOGLOBIN A1C
HEMOGLOBIN A1C: 5.6 % (ref <5.7)
Mean Plasma Glucose: 114 mg/dL

## 2016-06-07 LAB — LIPID PANEL
CHOL/HDL RATIO: 2.6 ratio (ref <5.0)
CHOLESTEROL: 121 mg/dL (ref <200)
HDL: 47 mg/dL (ref >40)
LDL CALC: 65 mg/dL (ref <100)
Triglycerides: 47 mg/dL (ref <150)
VLDL: 9 mg/dL (ref <30)

## 2016-06-09 ENCOUNTER — Other Ambulatory Visit: Payer: Self-pay | Admitting: Family Medicine

## 2016-06-09 DIAGNOSIS — D649 Anemia, unspecified: Secondary | ICD-10-CM

## 2016-06-09 LAB — INSULIN, FASTING: Insulin fasting, serum: 5.8 u[IU]/mL (ref 2.0–19.6)

## 2016-06-10 LAB — IRON,TIBC AND FERRITIN PANEL
%SAT: 21 % (ref 15–60)
FERRITIN: 53 ng/mL (ref 20–380)
Iron: 66 ug/dL (ref 50–180)
TIBC: 312 ug/dL (ref 250–425)

## 2016-06-17 ENCOUNTER — Encounter: Payer: Self-pay | Admitting: Family Medicine

## 2016-06-20 ENCOUNTER — Other Ambulatory Visit: Payer: Self-pay | Admitting: Family Medicine

## 2016-06-20 DIAGNOSIS — I2510 Atherosclerotic heart disease of native coronary artery without angina pectoris: Secondary | ICD-10-CM

## 2016-06-20 DIAGNOSIS — I10 Essential (primary) hypertension: Secondary | ICD-10-CM

## 2016-06-20 MED ORDER — CARVEDILOL 12.5 MG PO TABS: 12.5000 mg | ORAL_TABLET | Freq: Two times a day (BID) | ORAL | 1 refills | Status: DC

## 2016-06-20 MED ORDER — AMLODIPINE BESYLATE-VALSARTAN 10-320 MG PO TABS: 1.0000 | ORAL_TABLET | Freq: Every day | ORAL | 1 refills | Status: DC

## 2016-08-20 ENCOUNTER — Other Ambulatory Visit: Payer: Self-pay | Admitting: Family Medicine

## 2016-08-20 ENCOUNTER — Encounter: Payer: Self-pay | Admitting: Family Medicine

## 2016-08-20 MED ORDER — AMLODIPINE-OLMESARTAN 10-40 MG PO TABS: 1.0000 | ORAL_TABLET | Freq: Every day | ORAL | 1 refills | Status: DC

## 2016-08-22 DIAGNOSIS — I2510 Atherosclerotic heart disease of native coronary artery without angina pectoris: Secondary | ICD-10-CM | POA: Diagnosis not present

## 2016-09-16 ENCOUNTER — Other Ambulatory Visit: Payer: Self-pay | Admitting: Family Medicine

## 2016-09-16 ENCOUNTER — Encounter: Payer: Self-pay | Admitting: Family Medicine

## 2016-09-16 DIAGNOSIS — I739 Peripheral vascular disease, unspecified: Secondary | ICD-10-CM

## 2016-09-16 DIAGNOSIS — I2510 Atherosclerotic heart disease of native coronary artery without angina pectoris: Secondary | ICD-10-CM

## 2016-09-16 MED ORDER — CLOPIDOGREL BISULFATE 75 MG PO TABS: 75.0000 mg | ORAL_TABLET | Freq: Every day | ORAL | 1 refills | Status: DC

## 2016-09-29 DIAGNOSIS — Z08 Encounter for follow-up examination after completed treatment for malignant neoplasm: Secondary | ICD-10-CM | POA: Diagnosis not present

## 2016-09-29 DIAGNOSIS — Z923 Personal history of irradiation: Secondary | ICD-10-CM | POA: Diagnosis not present

## 2016-09-29 DIAGNOSIS — R339 Retention of urine, unspecified: Secondary | ICD-10-CM | POA: Diagnosis not present

## 2016-09-29 DIAGNOSIS — C61 Malignant neoplasm of prostate: Secondary | ICD-10-CM | POA: Diagnosis not present

## 2016-09-29 DIAGNOSIS — Z6828 Body mass index (BMI) 28.0-28.9, adult: Secondary | ICD-10-CM | POA: Diagnosis not present

## 2016-09-29 DIAGNOSIS — Z8546 Personal history of malignant neoplasm of prostate: Secondary | ICD-10-CM | POA: Diagnosis not present

## 2016-09-29 DIAGNOSIS — N529 Male erectile dysfunction, unspecified: Secondary | ICD-10-CM | POA: Diagnosis not present

## 2016-09-29 DIAGNOSIS — R351 Nocturia: Secondary | ICD-10-CM | POA: Diagnosis not present

## 2016-10-30 DIAGNOSIS — Z923 Personal history of irradiation: Secondary | ICD-10-CM | POA: Diagnosis not present

## 2016-10-30 DIAGNOSIS — R6882 Decreased libido: Secondary | ICD-10-CM | POA: Diagnosis not present

## 2016-10-30 DIAGNOSIS — Z905 Acquired absence of kidney: Secondary | ICD-10-CM | POA: Diagnosis not present

## 2016-10-30 DIAGNOSIS — I739 Peripheral vascular disease, unspecified: Secondary | ICD-10-CM | POA: Diagnosis not present

## 2016-10-30 DIAGNOSIS — Z85528 Personal history of other malignant neoplasm of kidney: Secondary | ICD-10-CM | POA: Diagnosis not present

## 2016-10-30 DIAGNOSIS — I2510 Atherosclerotic heart disease of native coronary artery without angina pectoris: Secondary | ICD-10-CM | POA: Diagnosis not present

## 2016-10-30 DIAGNOSIS — E785 Hyperlipidemia, unspecified: Secondary | ICD-10-CM | POA: Diagnosis not present

## 2016-10-30 DIAGNOSIS — E291 Testicular hypofunction: Secondary | ICD-10-CM | POA: Diagnosis not present

## 2016-10-30 DIAGNOSIS — I10 Essential (primary) hypertension: Secondary | ICD-10-CM | POA: Diagnosis not present

## 2016-10-30 DIAGNOSIS — I252 Old myocardial infarction: Secondary | ICD-10-CM | POA: Diagnosis not present

## 2016-10-30 DIAGNOSIS — N529 Male erectile dysfunction, unspecified: Secondary | ICD-10-CM | POA: Diagnosis not present

## 2016-10-30 DIAGNOSIS — Z6827 Body mass index (BMI) 27.0-27.9, adult: Secondary | ICD-10-CM | POA: Diagnosis not present

## 2016-10-30 DIAGNOSIS — C61 Malignant neoplasm of prostate: Secondary | ICD-10-CM | POA: Diagnosis not present

## 2016-10-30 DIAGNOSIS — Z8546 Personal history of malignant neoplasm of prostate: Secondary | ICD-10-CM | POA: Diagnosis not present

## 2016-10-30 DIAGNOSIS — Z7982 Long term (current) use of aspirin: Secondary | ICD-10-CM | POA: Diagnosis not present

## 2016-10-30 DIAGNOSIS — Z87891 Personal history of nicotine dependence: Secondary | ICD-10-CM | POA: Diagnosis not present

## 2016-11-18 ENCOUNTER — Ambulatory Visit (INDEPENDENT_AMBULATORY_CARE_PROVIDER_SITE_OTHER): Payer: Federal, State, Local not specified - PPO

## 2016-11-18 ENCOUNTER — Ambulatory Visit (INDEPENDENT_AMBULATORY_CARE_PROVIDER_SITE_OTHER): Payer: Federal, State, Local not specified - PPO | Admitting: Vascular Surgery

## 2016-11-18 ENCOUNTER — Encounter (INDEPENDENT_AMBULATORY_CARE_PROVIDER_SITE_OTHER): Payer: Self-pay | Admitting: Vascular Surgery

## 2016-11-18 VITALS — BP 119/83 | HR 54 | Resp 17 | Ht 72.0 in | Wt 203.0 lb

## 2016-11-18 DIAGNOSIS — I739 Peripheral vascular disease, unspecified: Secondary | ICD-10-CM | POA: Diagnosis not present

## 2016-11-18 DIAGNOSIS — E785 Hyperlipidemia, unspecified: Secondary | ICD-10-CM

## 2016-11-18 DIAGNOSIS — I10 Essential (primary) hypertension: Secondary | ICD-10-CM | POA: Diagnosis not present

## 2016-11-18 NOTE — Patient Instructions (Signed)

## 2016-11-18 NOTE — Assessment & Plan Note (Signed)
blood pressure control important in reducing the progression of atherosclerotic disease. On appropriate oral medications.  

## 2016-11-18 NOTE — Assessment & Plan Note (Signed)
lipid control important in reducing the progression of atherosclerotic disease. Continue statin therapy  

## 2016-11-18 NOTE — Assessment & Plan Note (Signed)
ABIs today remain normal at 1 bilaterally with good waveforms.  He has some mildly elevated velocities in the left iliac stent but no high-grade stenosis is identified.  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  Overall, he is doing well.  His blood pressure control is good.  He remains on antiplatelet therapy.  He remains on a statin.  I would continue his current medical regimen and recheck his perfusion in 1 year

## 2016-11-18 NOTE — Progress Notes (Signed)
MRN : [id number]  Roberto Walker is a 56 y.o. ([birth year]) male who presents with chief complaint of  Chief Complaint  Patient presents with  . Follow-up    1 year follow up u/s  .  History of Present Illness: Patient returns today in follow up of peripheral arterial disease.  Several years ago, he underwent aortoiliac intervention for claudication symptoms.  He currently has no significant lifestyle limiting claudication, ischemic rest pain, or tissue loss. ABIs today remain normal at 1 bilaterally with good waveforms.  He has some mildly elevated velocities in the left iliac stent but no high-grade stenosis is identified.  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    Current Outpatient Prescriptions  Medication Sig Dispense Refill  . amLODipine-benazepril (LOTREL) 10-40 MG capsule Take by mouth.    Marland Kitchen amLODipine-olmesartan (AZOR) 10-40 MG tablet Take 1 tablet by mouth daily. 90 tablet 1  . aspirin 81 MG tablet Take 1 tablet by mouth daily.    Marland Kitchen atorvastatin (LIPITOR) 80 MG tablet Take 1 tablet (80 mg total) by mouth daily. 90 tablet 1  . carvedilol (COREG) 12.5 MG tablet Take 1 tablet (12.5 mg total) by mouth 2 (two) times daily. 180 tablet 1  . cholecalciferol (VITAMIN D) 1000 UNITS tablet Take 1 tablet by mouth daily.    . clopidogrel (PLAVIX) 75 MG tablet Take 1 tablet (75 mg total) by mouth daily. 90 tablet 1  . fluticasone (FLONASE) 50 MCG/ACT nasal spray Place into the nose.    . nitroGLYCERIN (NITROSTAT) 0.4 MG SL tablet Place 1 tablet (0.4 mg total) under the tongue daily. 30 tablet 0  . sildenafil (VIAGRA) 50 MG tablet Take by mouth.     No current facility-administered medications for this visit.     Past Medical History:  Diagnosis Date  . Hypercalcemia   . Hyperlipidemia   . Hypertension   . Metabolic syndrome   . Microscopic hematuria   . Osteoarthrosis   . Peripheral vascular disease ([place])   . Prostate cancer ([place])   . Renal cell carcinoma  ([place])   . Vitamin D deficiency   . Vitiligo     Past Surgical History:  Procedure Laterality Date  . Beam Radiation     Prostate using Photons  . ILIAC VEIN ANGIOPLASTY / STENTING    . LEFT HEART CATH  01/11/2012  . NEPHRECTOMY Right 2010   Partial    Social History Social History  Substance Use Topics  . Smoking status: Former Smoker    Years: 20.00    Types: Cigarettes    Quit date: 01/12/2012  . Smokeless tobacco: Never Used  . Alcohol use 0.0 oz/week     Comment: Minimal alcohol consumption      Family History Family History  Problem Relation Age of Onset  . Hypertension Mother   . Lung cancer Father   . Hypertension Father   . Hyperlipidemia Father   . Diabetes Sister        oldest sister  . Hypertension Sister   . Hypertension Sister      Allergies  Allergen Reactions  . Lisinopril Swelling    Swelling around mouth, lips     REVIEW OF SYSTEMS (Negative unless checked)  Constitutional: [] Weight loss  [] Fever  [] Chills Cardiac: [] Chest pain   [] Chest pressure   [] Palpitations   [] Shortness of breath when laying flat   [] Shortness of breath at rest   [] Shortness of breath with exertion.  Vascular:  [] Pain in legs with walking   [] Pain in legs at rest   [] Pain in legs when laying flat   [] Claudication   [] Pain in feet when walking  [] Pain in feet at rest  [] Pain in feet when laying flat   [] History of DVT   [] Phlebitis   [] Swelling in legs   [] Varicose veins   [] Non-healing ulcers Pulmonary:   [] Uses home oxygen   [] Productive cough   [] Hemoptysis   [] Wheeze  [] COPD   [] Asthma Neurologic:  [] Dizziness  [] Blackouts   [] Seizures   [] History of stroke   [] History of TIA  [] Aphasia   [] Temporary blindness   [] Dysphagia   [] Weakness or numbness in arms   [] Weakness or numbness in legs Musculoskeletal:  [x] Arthritis   [] Joint swelling   [] Joint pain   [] Low back pain Hematologic:  [] Easy bruising  [] Easy bleeding   [] Hypercoagulable state   [] Anemic     Gastrointestinal:  [] Blood in stool   [] Vomiting blood  [] Gastroesophageal reflux/heartburn   [] Abdominal pain Genitourinary:  [x] Chronic kidney disease   [] Difficult urination  [] Frequent urination  [] Burning with urination   [] Hematuria Skin:  [] Rashes   [] Ulcers   [] Wounds Psychological:  [] History of anxiety   []  History of major depression.  Physical Examination  BP 119/83 (BP Location: Right Arm)   Pulse (!) 54   Resp 17   Ht 6' (1.829 m)   Wt 92.1 kg (203 lb)   BMI 27.53 kg/m  Gen:  WD/WN, NAD Head: /AT, No temporalis wasting. Ear/Nose/Throat: Hearing grossly intact, nares w/o erythema or drainage, trachea midline Eyes: Conjunctiva clear. Sclera non-icteric Neck: Supple.  No JVD.  Pulmonary:  Good air movement, no use of accessory muscles.  Cardiac: RRR, normal S1, S2 Vascular:  Vessel Right Left  Radial Palpable Palpable                          PT Palpable Palpable  DP Palpable Palpable    Musculoskeletal: M/S 5/5 throughout.  No deformity or atrophy.  Neurologic: Sensation grossly intact in extremities.  Symmetrical.  Speech is fluent.  Psychiatric: Judgment intact, Mood & affect appropriate for pt's clinical situation. Dermatologic: No rashes or ulcers noted.  Vitiligo present    Labs No results found for this or any previous visit (from the past 2160 hour(s)).  Radiology No results found.    Assessment/Plan  Benign essential HTN blood pressure control important in reducing the progression of atherosclerotic disease. On appropriate oral medications.   Dyslipidemia lipid control important in reducing the progression of atherosclerotic disease. Continue statin therapy   Peripheral vascular disease (HCC) ABIs today remain normal at 1 bilaterally with good waveforms.  He has some mildly elevated velocities in the left iliac stent but no high-grade stenosis is identified.  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  Overall, he is  doing well.  His blood pressure control is good.  He remains on antiplatelet therapy.  He remains on a statin.  I would continue his current medical regimen and recheck his perfusion in 1 year    Leotis Pain, MD  11/18/2016 9:50 AM    This note was created with Dragon medical transcription system.  Any errors from dictation are purely unintentional

## 2016-11-26 ENCOUNTER — Ambulatory Visit (INDEPENDENT_AMBULATORY_CARE_PROVIDER_SITE_OTHER): Payer: Federal, State, Local not specified - PPO | Admitting: Family Medicine

## 2016-11-26 ENCOUNTER — Encounter: Payer: Self-pay | Admitting: Family Medicine

## 2016-11-26 VITALS — BP 130/90 | HR 74 | Temp 98.5°F | Resp 14 | Ht 71.26 in | Wt 203.8 lb

## 2016-11-26 DIAGNOSIS — I10 Essential (primary) hypertension: Secondary | ICD-10-CM

## 2016-11-26 DIAGNOSIS — Z1211 Encounter for screening for malignant neoplasm of colon: Secondary | ICD-10-CM

## 2016-11-26 DIAGNOSIS — E8881 Metabolic syndrome: Secondary | ICD-10-CM

## 2016-11-26 DIAGNOSIS — E559 Vitamin D deficiency, unspecified: Secondary | ICD-10-CM | POA: Diagnosis not present

## 2016-11-26 DIAGNOSIS — G4733 Obstructive sleep apnea (adult) (pediatric): Secondary | ICD-10-CM

## 2016-11-26 DIAGNOSIS — I739 Peripheral vascular disease, unspecified: Secondary | ICD-10-CM

## 2016-11-26 DIAGNOSIS — R7989 Other specified abnormal findings of blood chemistry: Secondary | ICD-10-CM | POA: Diagnosis not present

## 2016-11-26 DIAGNOSIS — Z23 Encounter for immunization: Secondary | ICD-10-CM

## 2016-11-26 DIAGNOSIS — I2510 Atherosclerotic heart disease of native coronary artery without angina pectoris: Secondary | ICD-10-CM

## 2016-11-26 DIAGNOSIS — E785 Hyperlipidemia, unspecified: Secondary | ICD-10-CM | POA: Diagnosis not present

## 2016-11-26 DIAGNOSIS — C61 Malignant neoplasm of prostate: Secondary | ICD-10-CM

## 2016-11-26 DIAGNOSIS — Z85528 Personal history of other malignant neoplasm of kidney: Secondary | ICD-10-CM | POA: Diagnosis not present

## 2016-11-26 DIAGNOSIS — Z9989 Dependence on other enabling machines and devices: Secondary | ICD-10-CM

## 2016-11-26 DIAGNOSIS — N529 Male erectile dysfunction, unspecified: Secondary | ICD-10-CM | POA: Diagnosis not present

## 2016-11-26 DIAGNOSIS — Z0000 Encounter for general adult medical examination without abnormal findings: Secondary | ICD-10-CM | POA: Diagnosis not present

## 2016-11-26 LAB — POCT UA - GLUCOSE/PROTEIN
GLUCOSE UA: NEGATIVE
PROTEIN UA: NEGATIVE

## 2016-11-26 MED ORDER — ATORVASTATIN CALCIUM 80 MG PO TABS: 80.0000 mg | ORAL_TABLET | Freq: Every day | ORAL | 1 refills | Status: DC

## 2016-11-26 MED ORDER — CARVEDILOL 12.5 MG PO TABS: 12.5000 mg | ORAL_TABLET | Freq: Two times a day (BID) | ORAL | 1 refills | Status: DC

## 2016-11-26 NOTE — Patient Instructions (Signed)

## 2016-11-26 NOTE — Progress Notes (Signed)
Name: Roberto Walker   MRN: [id number]    DOB: [birth year]   Date:11/26/2016       Progress Note  Subjective  Chief Complaint  Chief Complaint  Patient presents with  . Annual Exam  . Hypertension  . Hyperlipidemia  . PVD    HPI  Male Exam: he takes Viagra about one hour because intercourse and it works well for him no side effects, he saw Dr. Francesca Jewett to find out if he had low T but was given reassurance. Sexually active and married.   IPSS Questionnaire (AUA-7): Over the past month.   1)  How often have you had a sensation of not emptying your bladder completely after you finish urinating?  0 - Not at all  2)  How often have you had to urinate again less than two hours after you finished urinating? 0 - Not at all  3)  How often have you found you stopped and started again several times when you urinated?  0 - Not at all  4) How difficult have you found it to postpone urination?  0 - Not at all  5) How often have you had a weak urinary stream?  0 - Not at all  6) How often have you had to push or strain to begin urination?  0 - Not at all  7) How many times did you most typically get up to urinate from the time you went to bed until the time you got up in the morning?  1 - 1 time  Total score:  0-7 mildly symptomatic   8-19 moderately symptomatic   20-35 severely symptomatic   CAD: he had an MI back in 2013, s/p stent placement, he has been asymptomatic since event. He sees Dr. Terance Hart at [facility] once year. Taking Aspirin and Plavix, he denies easy bruising or bleeding. No chest pain , he is very active at work, he is walking about 5000 steps per day. He also goes up and down stairs at work.   HTN: bp is at goal at home in the 117/84 usually, slightly elevated today, but we will monitor, taking medication, no side effects of medication. No chest pain or palpitation.   Hyperlipidemia: taking Atorvastatin, and is compliant , denies myalgia. Reviewed labs with patient, due for repeat  labs in April   OSA: he is not compliant with CPAP. He states he has been sleeping with head of the bed elevated and is not snoring as much, also waking up feeling rested.  History of kidney and prostate cancer : he is going to [facility] to check on prostate once every 6 months. S/p radiation . January 2016 - Dr. Bridgett Larsson - PSA is gradually improving.  PVD: sees Dr. Lucky Cowboy once a year, no claudication, no leg swelling.   Pre-diabetes: discussed life style modification, he denies polyphagia, polydipsia or polyuria.   ED: he has difficulty initiating and maintaining erection since prostate radiation therapy, he uses Viagra prn and needs a refill. He is aware of risk of hypotension with NTG.He does not need refills at this time.   Patient Active Problem List   Diagnosis Date Noted  . Personal history of kidney cancer 02/06/2015  . Benign essential HTN 08/02/2014  . Arteriosclerosis of coronary artery 08/02/2014  . Dyslipidemia 08/02/2014  . H/O acute myocardial infarction 08/02/2014  . 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/02/2014  . Dysmetabolic syndrome 63/14/9702  . Arthritis, degenerative 08/02/2014  . Peripheral vascular disease (  [place]) 08/02/2014  . Blood glucose elevated 08/02/2014  . Obstructive sleep apnea on CPAP 08/02/2014  . Vitamin D deficiency 08/02/2014  . Vitiligo 08/02/2014  . Malignant neoplasm of prostate ([place]) 11/30/2012    Past Surgical History:  Procedure Laterality Date  . Beam Radiation     Prostate using Photons  . ILIAC VEIN ANGIOPLASTY / STENTING    . LEFT HEART CATH  01/11/2012  . NEPHRECTOMY Right 2010   Partial    Family History  Problem Relation Age of Onset  . Hypertension Mother   . Lung cancer Father   . Hypertension Father   . Hyperlipidemia Father   . Diabetes Sister        oldest sister  . Hypertension Sister   . Hypertension Sister     Social History   Social History  . Marital status: Married    Spouse name:  Mateo Flow  . Number of children: 2  . 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 Smoking status: Former Smoker    Years: 20.00    Types: Cigarettes    Quit date: 01/12/2012  . Smokeless tobacco: Never Used  . Alcohol use 0.0 oz/week     Comment: Minimal alcohol consumption  . Drug use: No  . Sexual activity: Yes    Partners: Female   Other Topics Concern  . Not on file   Social History Narrative  . No narrative on file     Current Outpatient Prescriptions:  .  aspirin 81 MG tablet, Take 1 tablet by mouth daily., Disp: , Rfl:  .  atorvastatin (LIPITOR) 80 MG tablet, Take 1 tablet (80 mg total) by mouth daily., Disp: 90 tablet, Rfl: 1 .  carvedilol (COREG) 12.5 MG tablet, Take 1 tablet (12.5 mg total) by mouth 2 (two) times daily., Disp: 180 tablet, Rfl: 1 .  cholecalciferol (VITAMIN D) 1000 UNITS tablet, Take 1 tablet by mouth daily., Disp: , Rfl:  .  clopidogrel (PLAVIX) 75 MG tablet, Take 1 tablet (75 mg total) by mouth daily., Disp: 90 tablet, Rfl: 1 .  nitroGLYCERIN (NITROSTAT) 0.4 MG SL tablet, Place 1 tablet (0.4 mg total) under the tongue daily., Disp: 30 tablet, Rfl: 0 .  sildenafil (VIAGRA) 50 MG tablet, Take by mouth., Disp: , Rfl:  .  amLODipine-olmesartan (AZOR) 10-40 MG tablet, Take 1 tablet by mouth daily. (Patient not taking: Reported on 11/26/2016), Disp: 90 tablet, Rfl: 1  Allergies  Allergen Reactions  . Lisinopril Swelling    Swelling around mouth, lips     ROS  Constitutional: Negative for fever or weight change.  Respiratory: Negative for cough and shortness of breath.   Cardiovascular: Negative for chest pain or palpitations.  Gastrointestinal: Negative for abdominal pain, no bowel changes.  Musculoskeletal: Negative for gait problem or joint swelling.  Skin: Negative for rash.  Neurological: Negative for dizziness or headache.  No other specific complaints in a complete review of systems (except  as listed in HPI above). Objective  Vitals:   11/26/16 0831  BP: 130/90  Pulse: 74  Resp: 14  Temp: 98.5 F (36.9 C)  TempSrc: Oral  SpO2: 98%  Weight: 203 lb 12.8 oz (92.4 kg)  Height: 5' 11.26" (1.81 m)    Body mass index is 28.22 kg/m.  Physical Exam  Constitutional: Patient appears well-developed and well-nourished. No distress.  HENT: Head: Normocephalic and atraumatic. Ears: B TMs ok, no erythema or  effusion; Nose: Nose normal. Mouth/Throat: Oropharynx is clear and moist. No oropharyngeal exudate.  Eyes: Conjunctivae and EOM are normal. Pupils are equal, round, and reactive to light. No scleral icterus.  Neck: Normal range of motion. Neck supple. No JVD present. No thyromegaly present.  Cardiovascular: Normal rate, regular rhythm and normal heart sounds.  No murmur heard. No BLE edema. Pulmonary/Chest: Effort normal and breath sounds normal. No respiratory distress. Abdominal: Soft. Bowel sounds are normal, no distension. There is no tenderness. no masses MALE GENITALIA: Normal descended testes bilaterally, no masses palpated, no hernias, no lesions, no discharge RECTAL: Prostate normal size and consistency, no rectal masses or hemorrhoids Musculoskeletal: Normal range of motion, no joint effusions. No gross deformities Neurological: he is alert and oriented to person, place, and time. No cranial nerve deficit. Coordination, balance, strength, speech and gait are normal.  Skin: Skin is warm and dry. No rash noted. No erythema.  Psychiatric: Patient has a normal mood and affect. behavior is normal. Judgment and thought content normal.  PHQ2/9: Depression screen [facility] 2/9 11/26/2016 05/20/2016 08/02/2015 06/25/2015 02/06/2015  Decreased Interest 0 0 0 0 0  Down, Depressed, Hopeless 0 0 0 0 0  PHQ - 2 Score 0 0 0 0 0     Fall Risk: Fall Risk  11/26/2016 05/20/2016 08/02/2015 06/25/2015 02/06/2015  Falls in the past year? No No No No No     Functional Status Survey: Is the  patient deaf or have difficulty hearing?: No Does the patient have difficulty seeing, even when wearing glasses/contacts?: No Does the patient have difficulty concentrating, remembering, or making decisions?: No Does the patient have difficulty walking or climbing stairs?: No Does the patient have difficulty doing errands alone such as visiting a doctor's office or shopping?: No    Assessment & Plan  1. Encounter for routine history and physical exam for male  Discussed importance of 150 minutes of physical activity weekly, eat two servings of fish weekly, eat one serving of tree nuts ( cashews, pistachios, pecans, almonds.Marland Kitchen) every other day, eat 6 servings of fruit/vegetables daily and drink plenty of water and avoid sweet beverages.   2. Need for immunization against influenza  - Flu Vaccine QUAD 36+ mos IM  3. Benign essential HTN  - carvedilol (COREG) 12.5 MG tablet; Take 1 tablet (12.5 mg total) by mouth 2 (two) times daily.  Dispense: 180 tablet; Refill: 1  4. Arteriosclerosis of coronary artery  - atorvastatin (LIPITOR) 80 MG tablet; Take 1 tablet (80 mg total) by mouth daily.  Dispense: 90 tablet; Refill: 1 - carvedilol (COREG) 12.5 MG tablet; Take 1 tablet (12.5 mg total) by mouth 2 (two) times daily.  Dispense: 180 tablet; Refill: 1  5. Dyslipidemia  - atorvastatin (LIPITOR) 80 MG tablet; Take 1 tablet (80 mg total) by mouth daily.  Dispense: 90 tablet; Refill: 1  6. Dysmetabolic syndrome  Last labs were good  7. Vitamin D deficiency  Continue supplementation   8. Obstructive sleep apnea on CPAP   9. Abnormal CBC  - CBC with Differential/Platelet - Iron, TIBC and Ferritin Panel  10. Impotence of organic origin  Continue medication   11. Peripheral blood vessel disorder (HCC)  Doing well, seen by Dr. Lucky Cowboy  12. Screen for colon cancer  - Cologuard  13. Malignant neoplasm of prostate ([place])  He would like to hold off on PCV  14. Personal history of  kidney cancer  - POCT UA - Glucose/Protein

## 2016-11-27 ENCOUNTER — Other Ambulatory Visit: Payer: Self-pay | Admitting: Family Medicine

## 2016-11-27 DIAGNOSIS — Z85528 Personal history of other malignant neoplasm of kidney: Secondary | ICD-10-CM

## 2016-11-27 LAB — CBC WITH DIFFERENTIAL/PLATELET
BASOS ABS: 23 {cells}/uL (ref 0–200)
Basophils Relative: 0.3 %
EOS ABS: 122 {cells}/uL (ref 15–500)
Eosinophils Relative: 1.6 %
HCT: 39.5 % (ref 38.5–50.0)
Hemoglobin: 13.6 g/dL (ref 13.2–17.1)
Lymphs Abs: 2303 cells/uL (ref 850–3900)
MCH: 32.9 pg (ref 27.0–33.0)
MCHC: 34.4 g/dL (ref 32.0–36.0)
MCV: 95.6 fL (ref 80.0–100.0)
MPV: 9.3 fL (ref 7.5–12.5)
Monocytes Relative: 6.9 %
NEUTROS PCT: 60.9 %
Neutro Abs: 4628 cells/uL (ref 1500–7800)
Platelets: 258 10*3/uL (ref 140–400)
RBC: 4.13 10*6/uL — AB (ref 4.20–5.80)
RDW: 13.3 % (ref 11.0–15.0)
Total Lymphocyte: 30.3 %
WBC: 7.6 10*3/uL (ref 3.8–10.8)
WBCMIX: 524 {cells}/uL (ref 200–950)

## 2016-11-27 LAB — IRON,TIBC AND FERRITIN PANEL
%SAT: 31 % (ref 15–60)
Ferritin: 55 ng/mL (ref 20–380)
Iron: 99 ug/dL (ref 50–180)
TIBC: 324 ug/dL (ref 250–425)

## 2016-11-27 LAB — POCT URINE QUALITATIVE DIPSTICK BLOOD

## 2017-02-02 DIAGNOSIS — H2512 Age-related nuclear cataract, left eye: Secondary | ICD-10-CM | POA: Diagnosis not present

## 2017-03-04 DIAGNOSIS — I2510 Atherosclerotic heart disease of native coronary artery without angina pectoris: Secondary | ICD-10-CM | POA: Diagnosis not present

## 2017-03-16 ENCOUNTER — Encounter: Payer: Self-pay | Admitting: Family Medicine

## 2017-03-16 ENCOUNTER — Other Ambulatory Visit: Payer: Self-pay | Admitting: Family Medicine

## 2017-03-16 MED ORDER — AMLODIPINE-OLMESARTAN 10-40 MG PO TABS: 1.0000 | ORAL_TABLET | Freq: Every day | ORAL | 1 refills | Status: DC

## 2017-04-02 DIAGNOSIS — C61 Malignant neoplasm of prostate: Secondary | ICD-10-CM | POA: Diagnosis not present

## 2017-04-15 LAB — PSA: PSA: 0.72

## 2017-04-16 DIAGNOSIS — C61 Malignant neoplasm of prostate: Secondary | ICD-10-CM | POA: Diagnosis not present

## 2017-04-16 DIAGNOSIS — Z08 Encounter for follow-up examination after completed treatment for malignant neoplasm: Secondary | ICD-10-CM | POA: Diagnosis not present

## 2017-04-16 DIAGNOSIS — Z923 Personal history of irradiation: Secondary | ICD-10-CM | POA: Diagnosis not present

## 2017-04-16 DIAGNOSIS — N529 Male erectile dysfunction, unspecified: Secondary | ICD-10-CM | POA: Diagnosis not present

## 2017-04-16 DIAGNOSIS — Z8546 Personal history of malignant neoplasm of prostate: Secondary | ICD-10-CM | POA: Diagnosis not present

## 2017-04-16 DIAGNOSIS — Z09 Encounter for follow-up examination after completed treatment for conditions other than malignant neoplasm: Secondary | ICD-10-CM | POA: Diagnosis not present

## 2017-04-21 ENCOUNTER — Encounter: Payer: Self-pay | Admitting: Family Medicine

## 2017-06-30 ENCOUNTER — Other Ambulatory Visit: Payer: Self-pay | Admitting: Family Medicine

## 2017-06-30 ENCOUNTER — Encounter: Payer: Self-pay | Admitting: Family Medicine

## 2017-06-30 MED ORDER — AMLODIPINE-OLMESARTAN 10-40 MG PO TABS: 1.0000 | ORAL_TABLET | Freq: Every day | ORAL | 0 refills | Status: DC

## 2017-07-28 ENCOUNTER — Ambulatory Visit (INDEPENDENT_AMBULATORY_CARE_PROVIDER_SITE_OTHER): Payer: Federal, State, Local not specified - PPO | Admitting: Family Medicine

## 2017-07-28 ENCOUNTER — Encounter: Payer: Self-pay | Admitting: Family Medicine

## 2017-07-28 VITALS — BP 108/70 | HR 54 | Resp 16 | Ht 71.0 in | Wt 202.3 lb

## 2017-07-28 DIAGNOSIS — E8881 Metabolic syndrome: Secondary | ICD-10-CM | POA: Diagnosis not present

## 2017-07-28 DIAGNOSIS — E785 Hyperlipidemia, unspecified: Secondary | ICD-10-CM

## 2017-07-28 DIAGNOSIS — Z1211 Encounter for screening for malignant neoplasm of colon: Secondary | ICD-10-CM

## 2017-07-28 DIAGNOSIS — Z9989 Dependence on other enabling machines and devices: Secondary | ICD-10-CM | POA: Diagnosis not present

## 2017-07-28 DIAGNOSIS — I739 Peripheral vascular disease, unspecified: Secondary | ICD-10-CM

## 2017-07-28 DIAGNOSIS — N529 Male erectile dysfunction, unspecified: Secondary | ICD-10-CM | POA: Diagnosis not present

## 2017-07-28 DIAGNOSIS — I2510 Atherosclerotic heart disease of native coronary artery without angina pectoris: Secondary | ICD-10-CM | POA: Diagnosis not present

## 2017-07-28 DIAGNOSIS — I10 Essential (primary) hypertension: Secondary | ICD-10-CM | POA: Diagnosis not present

## 2017-07-28 DIAGNOSIS — E559 Vitamin D deficiency, unspecified: Secondary | ICD-10-CM

## 2017-07-28 DIAGNOSIS — G4733 Obstructive sleep apnea (adult) (pediatric): Secondary | ICD-10-CM | POA: Diagnosis not present

## 2017-07-28 MED ORDER — CLOPIDOGREL BISULFATE 75 MG PO TABS: 75.0000 mg | ORAL_TABLET | Freq: Every day | ORAL | 1 refills | Status: DC

## 2017-07-28 MED ORDER — NITROGLYCERIN 0.4 MG SL SUBL: 0.4000 mg | SUBLINGUAL_TABLET | Freq: Every day | SUBLINGUAL | 0 refills | Status: DC

## 2017-07-28 MED ORDER — CARVEDILOL 12.5 MG PO TABS: 12.5000 mg | ORAL_TABLET | Freq: Two times a day (BID) | ORAL | 1 refills | Status: DC

## 2017-07-28 MED ORDER — ATORVASTATIN CALCIUM 80 MG PO TABS: 80.0000 mg | ORAL_TABLET | Freq: Every day | ORAL | 1 refills | Status: DC

## 2017-07-28 MED ORDER — AMLODIPINE-OLMESARTAN 10-40 MG PO TABS: 1.0000 | ORAL_TABLET | Freq: Every day | ORAL | 0 refills | Status: DC

## 2017-07-28 MED ORDER — SILDENAFIL CITRATE 20 MG PO TABS: 20.0000 mg | ORAL_TABLET | Freq: Every day | ORAL | 5 refills | Status: DC | PRN

## 2017-07-28 NOTE — Progress Notes (Signed)
Name: Roberto Walker   MRN: [id number]    DOB: Sep 01, 1960   Date:07/28/2017       Progress Note  Subjective  Chief Complaint  Chief Complaint  Patient presents with  . Hypertension  . Hyperlipidemia    HPI  CAD: he had an MI back in 2013, s/p stent placement, he has been asymptomatic since event. He sees Dr. Terance Hart at [facility] every 6 months.  Taking Aspirin and Plavix, he denies easy bruising or bleeding. No chest pain , he is very active at work, he is walking about 5000-7000  steps per day. He also goes up and down stairs at work without SOB.   HTN: bp is at goal at home in the 110/70  usually, and about the same at out office today , taking medication, no side effects of medication. No chest pain or palpitation.  Hyperlipidemia: taking Atorvastatin, and is compliant , denies myalgia. Repeat blood work today  OSA: he is not compliant with CPAP. He states he has been sleeping with head of the bed elevated and wife states snoring is not bad  History of kidney and prostate cancer : he is going to [facility] to check on prostate once every 6 months. S/p radiation . January 2016 - Dr. Bridgett Larsson - PSA was 0.72 three months ago. No hematuria, dysuria or back pain   PVD: sees Dr. Lucky Cowboy once a year, no claudication, no leg swelling. He is on statin and plavix   Pre-diabetes: discussed life style modification, he denies polyphagia, polydipsia or polyuria. Recheck hgbA1C   ED: he has difficulty initiating and maintaining erection since prostate radiation therapy, he uses Viagra prn and needs a refill. He is aware of risk of hypotension with NTG.He does not need refills at this time. Unchanged, needs refills.    Patient Active Problem List   Diagnosis Date Noted  . Personal history of kidney cancer 02/06/2015  . Benign essential HTN 08/02/2014  . Arteriosclerosis of coronary artery 08/02/2014  . Dyslipidemia 08/02/2014  . H/O acute myocardial infarction 08/02/2014  . 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   . 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/02/2014  . Dysmetabolic syndrome 93/26/7124  . Arthritis, degenerative 08/02/2014  . Peripheral vascular disease ([place]) 08/02/2014  . Blood glucose elevated 08/02/2014  . Obstructive sleep apnea on CPAP 08/02/2014  . Vitamin D deficiency 08/02/2014  . Vitiligo 08/02/2014  . Malignant neoplasm of prostate ([place]) 11/30/2012    Past Surgical History:  Procedure Laterality Date  . Beam Radiation     Prostate using Photons  . ILIAC VEIN ANGIOPLASTY / STENTING    . LEFT HEART CATH  01/11/2012  . NEPHRECTOMY Right 2010   Partial    Family History  Problem Relation Age of Onset  . Hypertension Mother   . Lung cancer Father   . Hypertension Father   . Hyperlipidemia Father   . Diabetes Sister        oldest sister  . Hypertension Sister   . Hypertension Sister     Social History   Socioeconomic History  . Marital status: Married    Spouse name: Mateo Flow  . Number of children: 2  . Years of education: College   . Highest education level: Not on file  Occupational History  . Occupation: RDU  Social Needs  . Financial resource strain: Not on file  . Food insecurity:    Worry: Not on file    Inability: Not on file  . Transportation needs:    Medical: Not  on file    Non-medical: Not on file  Tobacco Use  . Smoking status: Former Smoker    Years: 20.00    Types: Cigarettes    Last attempt to quit: 01/12/2012    Years since quitting: 5.5  . Smokeless tobacco: Never Used  Substance and Sexual Activity  . Alcohol use: Yes    Alcohol/week: 0.0 oz    Comment: Minimal alcohol consumption  . Drug use: No  . Sexual activity: Yes    Partners: Female  Lifestyle  . Physical activity:    Days per week: Not on file    Minutes per session: Not on file  . Stress: Not on file  Relationships  . Social connections:    Talks on phone: Not on file    Gets together: Not on file    Attends religious service: Not on file    Active member of club or  organization: Not on file    Attends meetings of clubs or organizations: Not on file    Relationship status: Not on file  . Intimate partner violence:    Fear of current or ex partner: Not on file    Emotionally abused: Not on file    Physically abused: Not on file    Forced sexual activity: Not on file  Other Topics Concern  . Not on file  Social History Narrative  . Not on file     Current Outpatient Medications:  .  amLODipine-olmesartan (AZOR) 10-40 MG tablet, Take 1 tablet by mouth daily., Disp: 90 tablet, Rfl: 0 .  aspirin 81 MG tablet, Take 1 tablet by mouth daily., Disp: , Rfl:  .  atorvastatin (LIPITOR) 80 MG tablet, Take 1 tablet (80 mg total) by mouth daily., Disp: 90 tablet, Rfl: 1 .  carvedilol (COREG) 12.5 MG tablet, Take 1 tablet (12.5 mg total) by mouth 2 (two) times daily., Disp: 180 tablet, Rfl: 1 .  cholecalciferol (VITAMIN D) 1000 UNITS tablet, Take 1 tablet by mouth daily., Disp: , Rfl:  .  clopidogrel (PLAVIX) 75 MG tablet, Take 1 tablet (75 mg total) by mouth daily., Disp: 90 tablet, Rfl: 1 .  nitroGLYCERIN (NITROSTAT) 0.4 MG SL tablet, Place 1 tablet (0.4 mg total) under the tongue daily., Disp: 30 tablet, Rfl: 0 .  sildenafil (REVATIO) 20 MG tablet, Take 1-5 tablets (20-100 mg total) by mouth daily as needed., Disp: 10 tablet, Rfl: 5  Allergies  Allergen Reactions  . Lisinopril Swelling    Swelling around mouth, lips     ROS  Constitutional: Negative for fever or weight change.  Respiratory: Negative for cough and shortness of breath.   Cardiovascular: Negative for chest pain or palpitations.  Gastrointestinal: Negative for abdominal pain, no bowel changes.  Musculoskeletal: Negative for gait problem or joint swelling.  Skin: Negative for rash.  Neurological: Negative for dizziness or headache.  No other specific complaints in a complete review of systems (except as listed in HPI above).  Objective  Vitals:   07/28/17 0823  BP: 108/70  Pulse:  (!) 54  Resp: 16  SpO2: 99%  Weight: 202 lb 4.8 oz (91.8 kg)  Height: 5\' 11"  (1.803 m)    Body mass index is 28.22 kg/m.  Physical Exam  Constitutional: Patient appears well-developed and well-nourished. Overweight.  No distress.  HEENT: head atraumatic, normocephalic, pupils equal and reactive to light,  neck supple, throat within normal limits Cardiovascular: Normal rate, regular rhythm and normal heart sounds.  No murmur heard. No BLE  edema. Pulmonary/Chest: Effort normal and breath sounds normal. No respiratory distress. Abdominal: Soft.  There is no tenderness. Skin: Vitiligo  Psychiatric: Patient has a normal mood and affect. behavior is normal. Judgment and thought content normal.  PHQ2/9: Depression screen [facility] 2/9 07/28/2017 11/26/2016 05/20/2016 08/02/2015 06/25/2015  Decreased Interest 0 0 0 0 0  Down, Depressed, Hopeless 0 0 0 0 0  PHQ - 2 Score 0 0 0 0 0     Fall Risk: Fall Risk  07/28/2017 11/26/2016 05/20/2016 08/02/2015 06/25/2015  Falls in the past year? No No No No No     Functional Status Survey: Is the patient deaf or have difficulty hearing?: No Does the patient have difficulty seeing, even when wearing glasses/contacts?: No Does the patient have difficulty concentrating, remembering, or making decisions?: No Does the patient have difficulty walking or climbing stairs?: No Does the patient have difficulty dressing or bathing?: No Does the patient have difficulty doing errands alone such as visiting a doctor's office or shopping?: No    Assessment & Plan  1. Arteriosclerosis of coronary artery  - atorvastatin (LIPITOR) 80 MG tablet; Take 1 tablet (80 mg total) by mouth daily.  Dispense: 90 tablet; Refill: 1 - carvedilol (COREG) 12.5 MG tablet; Take 1 tablet (12.5 mg total) by mouth 2 (two) times daily.  Dispense: 180 tablet; Refill: 1 - clopidogrel (PLAVIX) 75 MG tablet; Take 1 tablet (75 mg total) by mouth daily.  Dispense: 90 tablet; Refill: 1 -  nitroGLYCERIN (NITROSTAT) 0.4 MG SL tablet; Place 1 tablet (0.4 mg total) under the tongue daily.  Dispense: 30 tablet; Refill: 0  2. Dyslipidemia  - atorvastatin (LIPITOR) 80 MG tablet; Take 1 tablet (80 mg total) by mouth daily.  Dispense: 90 tablet; Refill: 1 - Lipid panel  3. Benign essential HTN  - amLODipine-olmesartan (AZOR) 10-40 MG tablet; Take 1 tablet by mouth daily.  Dispense: 90 tablet; Refill: 0 - carvedilol (COREG) 12.5 MG tablet; Take 1 tablet (12.5 mg total) by mouth 2 (two) times daily.  Dispense: 180 tablet; Refill: 1 - COMPLETE METABOLIC PANEL WITH GFR - CBC with Differential/Platelet  4. Peripheral blood vessel disorder (HCC)  - clopidogrel (PLAVIX) 75 MG tablet; Take 1 tablet (75 mg total) by mouth daily.  Dispense: 90 tablet; Refill: 1  5. Vitamin D deficiency  - VITAMIN D 25 Hydroxy (Vit-D Deficiency, Fractures)  6. Dysmetabolic syndrome  - Hemoglobin A1c  7. Impotence of organic origin  - sildenafil (REVATIO) 20 MG tablet; Take 1-5 tablets (20-100 mg total) by mouth daily as needed.  Dispense: 10 tablet; Refill: 5  8. Obstructive sleep apnea on CPAP   9. Colon cancer screening  - Ambulatory referral to Gastroenterology

## 2017-07-29 LAB — CBC WITH DIFFERENTIAL/PLATELET
BASOS PCT: 0.3 %
Basophils Absolute: 22 cells/uL (ref 0–200)
EOS PCT: 2.6 %
Eosinophils Absolute: 190 cells/uL (ref 15–500)
HEMATOCRIT: 38.2 % — AB (ref 38.5–50.0)
Hemoglobin: 13.3 g/dL (ref 13.2–17.1)
LYMPHS ABS: 2336 {cells}/uL (ref 850–3900)
MCH: 32.8 pg (ref 27.0–33.0)
MCHC: 34.8 g/dL (ref 32.0–36.0)
MCV: 94.1 fL (ref 80.0–100.0)
MPV: 8.8 fL (ref 7.5–12.5)
Monocytes Relative: 7.3 %
Neutro Abs: 4219 cells/uL (ref 1500–7800)
Neutrophils Relative %: 57.8 %
PLATELETS: 284 10*3/uL (ref 140–400)
RBC: 4.06 10*6/uL — AB (ref 4.20–5.80)
RDW: 13.4 % (ref 11.0–15.0)
Total Lymphocyte: 32 %
WBC: 7.3 10*3/uL (ref 3.8–10.8)
WBCMIX: 533 {cells}/uL (ref 200–950)

## 2017-07-29 LAB — COMPLETE METABOLIC PANEL WITH GFR
AG Ratio: 1.6 (calc) (ref 1.0–2.5)
ALKALINE PHOSPHATASE (APISO): 55 U/L (ref 40–115)
ALT: 25 U/L (ref 9–46)
AST: 23 U/L (ref 10–35)
Albumin: 4.3 g/dL (ref 3.6–5.1)
BILIRUBIN TOTAL: 0.4 mg/dL (ref 0.2–1.2)
BUN: 16 mg/dL (ref 7–25)
CHLORIDE: 108 mmol/L (ref 98–110)
CO2: 24 mmol/L (ref 20–32)
CREATININE: 1.08 mg/dL (ref 0.70–1.33)
Calcium: 10.3 mg/dL (ref 8.6–10.3)
GFR, Est African American: 88 mL/min/{1.73_m2} (ref >=60)
GFR, Est Non African American: 76 mL/min/{1.73_m2} (ref >=60)
GLUCOSE: 110 mg/dL (ref 65–139)
Globulin: 2.7 g/dL (calc) (ref 1.9–3.7)
Potassium: 3.8 mmol/L (ref 3.5–5.3)
Sodium: 139 mmol/L (ref 135–146)
Total Protein: 7 g/dL (ref 6.1–8.1)

## 2017-07-29 LAB — LIPID PANEL
Cholesterol: 126 mg/dL (ref <200)
HDL: 55 mg/dL (ref >40)
LDL CHOLESTEROL (CALC): 58 mg/dL
Non-HDL Cholesterol (Calc): 71 mg/dL (calc) (ref <130)
TRIGLYCERIDES: 57 mg/dL (ref <150)
Total CHOL/HDL Ratio: 2.3 (calc) (ref <5.0)

## 2017-07-29 LAB — VITAMIN D 25 HYDROXY (VIT D DEFICIENCY, FRACTURES): VIT D 25 HYDROXY: 33 ng/mL (ref 30–100)

## 2017-07-29 LAB — HEMOGLOBIN A1C
HEMOGLOBIN A1C: 5.5 %{Hb} (ref <5.7)
Mean Plasma Glucose: 111 (calc)
eAG (mmol/L): 6.2 (calc)

## 2017-08-19 ENCOUNTER — Encounter: Payer: Self-pay | Admitting: *Deleted

## 2017-09-09 DIAGNOSIS — I429 Cardiomyopathy, unspecified: Secondary | ICD-10-CM | POA: Diagnosis not present

## 2017-09-09 DIAGNOSIS — I2510 Atherosclerotic heart disease of native coronary artery without angina pectoris: Secondary | ICD-10-CM | POA: Diagnosis not present

## 2017-09-09 DIAGNOSIS — I340 Nonrheumatic mitral (valve) insufficiency: Secondary | ICD-10-CM | POA: Diagnosis not present

## 2017-09-09 DIAGNOSIS — I252 Old myocardial infarction: Secondary | ICD-10-CM | POA: Diagnosis not present

## 2017-09-09 DIAGNOSIS — Z7982 Long term (current) use of aspirin: Secondary | ICD-10-CM | POA: Diagnosis not present

## 2017-09-09 DIAGNOSIS — I10 Essential (primary) hypertension: Secondary | ICD-10-CM | POA: Diagnosis not present

## 2017-09-09 DIAGNOSIS — I493 Ventricular premature depolarization: Secondary | ICD-10-CM | POA: Diagnosis not present

## 2017-09-09 DIAGNOSIS — E785 Hyperlipidemia, unspecified: Secondary | ICD-10-CM | POA: Diagnosis not present

## 2017-09-09 DIAGNOSIS — R9431 Abnormal electrocardiogram [ECG] [EKG]: Secondary | ICD-10-CM | POA: Diagnosis not present

## 2017-09-09 DIAGNOSIS — Z79899 Other long term (current) drug therapy: Secondary | ICD-10-CM | POA: Diagnosis not present

## 2017-09-28 ENCOUNTER — Other Ambulatory Visit: Payer: Self-pay

## 2017-09-28 ENCOUNTER — Other Ambulatory Visit: Payer: Self-pay | Admitting: Family Medicine

## 2017-09-28 ENCOUNTER — Encounter: Payer: Self-pay | Admitting: Family Medicine

## 2017-09-28 DIAGNOSIS — I10 Essential (primary) hypertension: Secondary | ICD-10-CM

## 2017-09-28 MED ORDER — AMLODIPINE-OLMESARTAN 10-40 MG PO TABS: 1.0000 | ORAL_TABLET | Freq: Every day | ORAL | 0 refills | Status: DC

## 2017-09-28 NOTE — Telephone Encounter (Signed)
Refill request was sent to Dr. Krichna Sowles for approval and submission.  

## 2017-10-15 DIAGNOSIS — C61 Malignant neoplasm of prostate: Secondary | ICD-10-CM | POA: Diagnosis not present

## 2017-10-15 DIAGNOSIS — Z923 Personal history of irradiation: Secondary | ICD-10-CM | POA: Diagnosis not present

## 2017-10-15 DIAGNOSIS — N529 Male erectile dysfunction, unspecified: Secondary | ICD-10-CM | POA: Diagnosis not present

## 2017-10-15 DIAGNOSIS — Z6827 Body mass index (BMI) 27.0-27.9, adult: Secondary | ICD-10-CM | POA: Diagnosis not present

## 2017-10-15 DIAGNOSIS — Z08 Encounter for follow-up examination after completed treatment for malignant neoplasm: Secondary | ICD-10-CM | POA: Diagnosis not present

## 2017-10-15 DIAGNOSIS — Z09 Encounter for follow-up examination after completed treatment for conditions other than malignant neoplasm: Secondary | ICD-10-CM | POA: Diagnosis not present

## 2017-10-15 DIAGNOSIS — Z8546 Personal history of malignant neoplasm of prostate: Secondary | ICD-10-CM | POA: Diagnosis not present

## 2017-11-20 ENCOUNTER — Ambulatory Visit (INDEPENDENT_AMBULATORY_CARE_PROVIDER_SITE_OTHER): Payer: Federal, State, Local not specified - PPO | Admitting: Nurse Practitioner

## 2017-11-20 ENCOUNTER — Encounter (INDEPENDENT_AMBULATORY_CARE_PROVIDER_SITE_OTHER): Payer: Federal, State, Local not specified - PPO

## 2017-12-09 ENCOUNTER — Encounter (INDEPENDENT_AMBULATORY_CARE_PROVIDER_SITE_OTHER): Payer: Self-pay | Admitting: Vascular Surgery

## 2017-12-09 ENCOUNTER — Ambulatory Visit (INDEPENDENT_AMBULATORY_CARE_PROVIDER_SITE_OTHER): Payer: Federal, State, Local not specified - PPO | Admitting: Vascular Surgery

## 2017-12-09 ENCOUNTER — Ambulatory Visit (INDEPENDENT_AMBULATORY_CARE_PROVIDER_SITE_OTHER): Payer: Federal, State, Local not specified - PPO

## 2017-12-09 VITALS — BP 126/86 | HR 53 | Resp 17 | Ht 72.0 in | Wt 200.0 lb

## 2017-12-09 DIAGNOSIS — Z87891 Personal history of nicotine dependence: Secondary | ICD-10-CM | POA: Diagnosis not present

## 2017-12-09 DIAGNOSIS — I739 Peripheral vascular disease, unspecified: Secondary | ICD-10-CM

## 2017-12-09 DIAGNOSIS — I10 Essential (primary) hypertension: Secondary | ICD-10-CM

## 2017-12-09 DIAGNOSIS — E785 Hyperlipidemia, unspecified: Secondary | ICD-10-CM | POA: Diagnosis not present

## 2017-12-09 NOTE — Progress Notes (Signed)
Subjective:    Patient ID: Roberto Walker, male    DOB: 01-10-1961, 57 y.o.   MRN: [id number] Chief Complaint  Patient presents with  . Follow-up    1 year aorta iliac and ABI f/u   Patient presents for a yearly atherosclerotic disease follow-up.  The patient is status post a endovascular intervention to the left lower extremity in April 2015.  The patient presents today without complaint.  The patient denies any claudication-like symptoms, rest pain or ulcer formation to the bilateral legs.  Patient denies any issues with edema to the bilateral legs.  The patient underwent a aortoiliac which was notable for triphasic blood flow through the right iliac system with biphasic blood flow through the left and a patent stent.  No evidence of abdominal aortic aneurysm was visualized.  The patient also underwent a bilateral ABI which was notable for normal resting ankle-brachial indices within normal range bilaterally.  No evidence of significant bilateral lower extremity arterial disease.  Right toe brachial index is normal.  Left toe brachial index is abnormal however with reasonably normal waveforms.  The patient denies any fever, nausea vomiting.  Review of Systems  Constitutional: Negative.   HENT: Negative.   Eyes: Negative.   Respiratory: Negative.   Cardiovascular: Negative.   Gastrointestinal: Negative.   Endocrine: Negative.   Genitourinary: Negative.   Musculoskeletal: Negative.   Skin: Negative.   Allergic/Immunologic: Negative.   Neurological: Negative.   Hematological: Negative.   Psychiatric/Behavioral: Negative.       Objective:   Physical Exam  Constitutional: He is oriented to person, place, and time. He appears well-developed and well-nourished. No distress.  HENT:  Head: Normocephalic.  Right Ear: External ear normal.  Left Ear: External ear normal.  Eyes: Pupils are equal, round, and reactive to light. Conjunctivae and EOM are normal.  Neck: Normal range of motion.    Cardiovascular: Normal rate, regular rhythm, normal heart sounds and intact distal pulses.  Pulses:      Radial pulses are 2+ on the right side, and 2+ on the left side.       Dorsalis pedis pulses are 2+ on the right side, and 2+ on the left side.       Posterior tibial pulses are 2+ on the right side, and 2+ on the left side.  Pulmonary/Chest: Effort normal and breath sounds normal.  Musculoskeletal: Normal range of motion. He exhibits no edema.  Neurological: He is alert and oriented to person, place, and time.  Skin: Skin is warm and dry. He is not diaphoretic.  Psychiatric: He has a normal mood and affect. His behavior is normal. Judgment and thought content normal.  Vitals reviewed.  BP 126/86 (BP Location: Right Arm, Patient Position: Sitting)   Pulse (!) 53   Resp 17   Ht 6' (1.829 m)   Wt 200 lb (90.7 kg)   BMI 27.12 kg/m   Past Medical History:  Diagnosis Date  . Hypercalcemia   . Hyperlipidemia   . Hypertension   . Metabolic syndrome   . Microscopic hematuria   . Osteoarthrosis   . Peripheral vascular disease ([place])   . Prostate cancer ([place])   . Renal cell carcinoma ([place])   . Vitamin D deficiency   . Vitiligo    Social History   Socioeconomic History  . Marital status: Married    Spouse name: Mateo Flow  . Number of children: 2  . Years of education: College   . Highest education level:  Not on file  Occupational History  . Occupation: RDU  Social Needs  . Financial resource strain: Not on file  . Food insecurity:    Worry: Not on file    Inability: Not on file  . Transportation needs:    Medical: Not on file    Non-medical: Not on file  Tobacco Use  . Smoking status: Former Smoker    Years: 20.00    Types: Cigarettes    Last attempt to quit: 01/12/2012    Years since quitting: 5.9  . Smokeless tobacco: Never Used  Substance and Sexual Activity  . Alcohol use: Yes    Alcohol/week: 0.0 standard drinks    Comment: Minimal alcohol consumption  . Drug  use: No  . Sexual activity: Yes    Partners: Female  Lifestyle  . Physical activity:    Days per week: Not on file    Minutes per session: Not on file  . Stress: Not on file  Relationships  . Social connections:    Talks on phone: Not on file    Gets together: Not on file    Attends religious service: Not on file    Active member of club or organization: Not on file    Attends meetings of clubs or organizations: Not on file    Relationship status: Not on file  . Intimate partner violence:    Fear of current or ex partner: Not on file    Emotionally abused: Not on file    Physically abused: Not on file    Forced sexual activity: Not on file  Other Topics Concern  . Not on file  Social History Narrative  . Not on file   Past Surgical History:  Procedure Laterality Date  . Beam Radiation     Prostate using Photons  . ILIAC VEIN ANGIOPLASTY / STENTING    . LEFT HEART CATH  01/11/2012  . NEPHRECTOMY Right 2010   Partial   Family History  Problem Relation Age of Onset  . Hypertension Mother   . Lung cancer Father   . Hypertension Father   . Hyperlipidemia Father   . Diabetes Sister        oldest sister  . Hypertension Sister   . Hypertension Sister    Allergies  Allergen Reactions  . Lisinopril Swelling    Swelling around mouth, lips      Assessment & Plan:  Patient presents for a yearly atherosclerotic disease follow-up.  The patient is status post a endovascular intervention to the left lower extremity in April 2015.  The patient presents today without complaint.  The patient denies any claudication-like symptoms, rest pain or ulcer formation to the bilateral legs.  Patient denies any issues with edema to the bilateral legs.  The patient underwent a aortoiliac which was notable for triphasic blood flow through the right iliac system with biphasic blood flow through the left and a patent stent.  No evidence of abdominal aortic aneurysm was visualized.  The patient also  underwent a bilateral ABI which was notable for normal resting ankle-brachial indices within normal range bilaterally.  No evidence of significant bilateral lower extremity arterial disease.  Right toe brachial index is normal.  Left toe brachial index is abnormal however with reasonably normal waveforms.  The patient denies any fever, nausea vomiting.  1. Peripheral vascular disease ([place]) - Stable Patient presents today without symptoms Physical exam is unremarkable ABI/aortoiliac is stable There is no indication for intervention at this time Patient  is to follow-up in 1 year for surveillance duplexes due to the stent in his left iliac system I have discussed with the patient at length the risk factors for and pathogenesis of atherosclerotic disease and encouraged a healthy diet, regular exercise regimen and blood pressure / glucose control.  The patient was encouraged to call the office in the interim if he experiences any claudication like symptoms, rest pain or ulcers to his feet / toes.  - VAS [place] ABI WITH/WO TBI; Future - VAS US AORTA/IVC/ILIACS; Future  2. Dyslipidemia - Stable On ASA and statin Encouraged good control as its slows the progression of atherosclerotic disease  3. Benign essential HTN - Stable Encouraged good control as its slows the progression of atherosclerotic disease  Current Outpatient Medications on File Prior to Visit  Medication Sig Dispense Refill  . amLODipine-olmesartan (AZOR) 10-40 MG tablet Take 1 tablet by mouth daily. 90 tablet 0  . aspirin 81 MG tablet Take 1 tablet by mouth daily.    Marland Kitchen atorvastatin (LIPITOR) 80 MG tablet Take 1 tablet (80 mg total) by mouth daily. 90 tablet 1  . carvedilol (COREG) 12.5 MG tablet Take 1 tablet (12.5 mg total) by mouth 2 (two) times daily. 180 tablet 1  . cholecalciferol (VITAMIN D) 1000 UNITS tablet Take 1 tablet by mouth daily.    . clopidogrel (PLAVIX) 75 MG tablet Take 1 tablet (75 mg total) by mouth daily. 90  tablet 1  . nitroGLYCERIN (NITROSTAT) 0.4 MG SL tablet Place 1 tablet (0.4 mg total) under the tongue daily. 30 tablet 0  . sildenafil (REVATIO) 20 MG tablet Take 1-5 tablets (20-100 mg total) by mouth daily as needed. 10 tablet 5   No current facility-administered medications on file prior to visit.    There are no Patient Instructions on file for this visit. No follow-ups on file.  Jan Walters A Rosaura Bolon, PA-C

## 2018-01-04 ENCOUNTER — Other Ambulatory Visit: Payer: Self-pay | Admitting: Family Medicine

## 2018-01-04 DIAGNOSIS — I10 Essential (primary) hypertension: Secondary | ICD-10-CM

## 2018-01-04 MED ORDER — AMLODIPINE-OLMESARTAN 10-40 MG PO TABS: 1.0000 | ORAL_TABLET | Freq: Every day | ORAL | 0 refills | Status: DC

## 2018-01-20 ENCOUNTER — Ambulatory Visit (INDEPENDENT_AMBULATORY_CARE_PROVIDER_SITE_OTHER): Payer: Federal, State, Local not specified - PPO | Admitting: Family Medicine

## 2018-01-20 ENCOUNTER — Encounter: Payer: Self-pay | Admitting: Family Medicine

## 2018-01-20 VITALS — BP 100/64 | HR 75 | Temp 97.9°F | Resp 16 | Ht 71.0 in | Wt 199.7 lb

## 2018-01-20 DIAGNOSIS — Z1159 Encounter for screening for other viral diseases: Secondary | ICD-10-CM

## 2018-01-20 DIAGNOSIS — I739 Peripheral vascular disease, unspecified: Secondary | ICD-10-CM

## 2018-01-20 DIAGNOSIS — E785 Hyperlipidemia, unspecified: Secondary | ICD-10-CM

## 2018-01-20 DIAGNOSIS — Z1211 Encounter for screening for malignant neoplasm of colon: Secondary | ICD-10-CM

## 2018-01-20 DIAGNOSIS — I10 Essential (primary) hypertension: Secondary | ICD-10-CM | POA: Diagnosis not present

## 2018-01-20 DIAGNOSIS — Z0000 Encounter for general adult medical examination without abnormal findings: Secondary | ICD-10-CM

## 2018-01-20 DIAGNOSIS — I2510 Atherosclerotic heart disease of native coronary artery without angina pectoris: Secondary | ICD-10-CM

## 2018-01-20 DIAGNOSIS — Z23 Encounter for immunization: Secondary | ICD-10-CM | POA: Diagnosis not present

## 2018-01-20 MED ORDER — AMLODIPINE-OLMESARTAN 10-40 MG PO TABS: 1.0000 | ORAL_TABLET | Freq: Every day | ORAL | 1 refills | Status: DC

## 2018-01-20 MED ORDER — NITROGLYCERIN 0.4 MG SL SUBL: 0.4000 mg | SUBLINGUAL_TABLET | Freq: Every day | SUBLINGUAL | 0 refills | Status: DC

## 2018-01-20 MED ORDER — ATORVASTATIN CALCIUM 80 MG PO TABS: 80.0000 mg | ORAL_TABLET | Freq: Every day | ORAL | 1 refills | Status: AC

## 2018-01-20 MED ORDER — CLOPIDOGREL BISULFATE 75 MG PO TABS: 75.0000 mg | ORAL_TABLET | Freq: Every day | ORAL | 1 refills | Status: AC

## 2018-01-20 MED ORDER — CARVEDILOL 12.5 MG PO TABS: 12.5000 mg | ORAL_TABLET | Freq: Two times a day (BID) | ORAL | 1 refills | Status: DC

## 2018-01-20 NOTE — Progress Notes (Signed)
Name: Roberto Walker   MRN: [id number]    DOB: Feb 28, 1960   Date:01/20/2018       Progress Note  Subjective  Chief Complaint  Chief Complaint  Patient presents with  . Annual Exam    HPI  Patient presents for annual CPE and follow up.  CAD: he had an MI back in 2013, s/p stent placement, he has been asymptomatic since event. He [facility] Ter[facility] a year now   Taking Aspirin and Plavix, he denies easy bruising or bleeding. Getting 10 k steps now most days of the week. He also goes up and down stairs at work without SOB.  HTN: bp is at goalat home in the 120's/80's  usually, taking medication, no side effects of medication. No chest pain or palpitation.  Hyperlipidemia: taking Atorvastatin, and is compliant , denies myalgia. last labs reviewed and LDL was 58, continue medication   OSA: he is not compliant with CPAP. He states he has been sleeping with head of the bed elevated and wife states snoring is not bad, unchanged   History of kidney and prostate cancer : he is going to [facility] to check on prostate once every 6 months. S/p radiation . January 2016 - Dr. Bridgett Larsson - last PSA was 0.10 September 2017 No hematuria, dysuria or back pain   PVD: sees Dr. Lucky Cowboy once a year, no claudication, no leg swelling. He is on statin and plavix Recent seen by vascular surgeon PA  Pre-diabetes:he has been following life style modification , last A1C back to normal at 5.5% , he denies polyphagia, polydipsia or polyuria.   ED: he has difficulty initiating and maintaining erection since prostate radiation therapy, he uses Viagra prn and is getting from [place]  He is aware of risk of hypotension with NTG.  Diet: packs his lunch, lots of fruit and vegetables, eats fish and seafood weekly   Exercise: 5 days a week   Depression:  Depression screen [facility] 2/9 07/28/2017 11/26/2016 05/20/2016 08/02/2015 06/25/2015  Decreased Interest 0 0 0 0 0  Down, Depressed, Hopeless 0 0 0 0 0  PHQ - 2 Score 0 0  0 0 0    Hypertension:  BP Readings from Last 3 Encounters:  01/20/18 100/64  12/09/17 126/86  07/28/17 108/70    Obesity: Wt Readings from Last 3 Encounters:  01/20/18 199 lb 11.2 oz (90.6 kg)  12/09/17 200 lb (90.7 kg)  07/28/17 202 lb 4.8 oz (91.8 kg)   BMI Readings from Last 3 Encounters:  01/20/18 27.85 kg/m  12/09/17 27.12 kg/m  07/28/17 28.22 kg/m     Lipids:  Lab Results  Component Value Date   CHOL 126 07/28/2017   CHOL 121 06/06/2016   CHOL 134 02/16/2015   Lab Results  Component Value Date   HDL 55 07/28/2017   HDL 47 06/06/2016   HDL 48 02/16/2015   Lab Results  Component Value Date   LDLCALC 58 07/28/2017   LDLCALC 65 06/06/2016   LDLCALC 77 02/16/2015   Lab Results  Component Value Date   TRIG 57 07/28/2017   TRIG 47 06/06/2016   TRIG 43 02/16/2015   Lab Results  Component Value Date   CHOLHDL 2.3 07/28/2017   CHOLHDL 2.6 06/06/2016   CHOLHDL 2.8 02/16/2015   No results found for: LDLDIRECT Glucose:  Glucose  Date Value Ref Range Status  08/02/2015 103 (H) 65 - 99 mg/dL Final  02/16/2015 107 (H) 65 - 99 mg/dL Final  08/04/2014 85 65 - 99 mg/dL Final  05/17/2013 150 (H) 65 - 99 mg/dL Final  05/16/2013 111 (H) 65 - 99 mg/dL Final  03/08/2013 127 (H) 65 - 99 mg/dL Final   Glucose, Bld  Date Value Ref Range Status  07/28/2017 110 65 - 139 mg/dL Final    Comment:    .        Non-fasting reference interval .   06/06/2016 96 65 - 99 mg/dL Final      [facility] Visit from 01/20/2018 in [facility]  AUDIT-C Score  3     Married STD testing and prevention (HIV/chl/gon/syphilis): N/A Hep C: up to date  Skin cancer: he has vitiligo  Colorectal cancer: he will call GI Prostate cancer: seeing Urologist  Lab Results  Component Value Date   PSA 0.72 04/15/2017   PSA 3.7 09/22/2013    IPSS Questionnaire (AUA-7): Over the past month.   1)  How often have you had a sensation of not emptying your bladder  completely after you finish urinating?  1 - Less than 1 time in 5  2)  How often have you had to urinate again less than two hours after you finished urinating? 0 - Not at all  3)  How often have you found you stopped and started again several times when you urinated?  0 - Not at all  4) How difficult have you found it to postpone urination?  0 - Not at all  5) How often have you had a weak urinary stream?  0 - Not at all  6) How often have you had to push or strain to begin urination?  0 - Not at all  7) How many times did you most typically get up to urinate from the time you went to bed until the time you got up in the morning?  1 - 1 time  Total score:  0-7 mildly symptomatic   8-19 moderately symptomatic   20-35 severely symptomatic    Lung cancer:   Low Dose CT Chest recommended if Age 17-80 years, 30 pack-year currently smoking OR have quit w/in 15years. Patient does not qualify.   ECG:  Cardiologist   Advanced Care Planning: A voluntary discussion about advance care planning including the explanation and discussion of advance directives.  Discussed health care proxy and Living will, and the patient was able to identify a health care proxy as wife   Patient does have a living will at present time. If patient does have living will, I have requested they bring this to the clinic to be scanned in to their chart.  Patient Active Problem List   Diagnosis Date Noted  . Personal history of kidney cancer 02/06/2015  . Benign essential HTN 08/02/2014  . Arteriosclerosis of coronary artery 08/02/2014  . Dyslipidemia 08/02/2014  . H/O acute myocardial infarction 08/02/2014  . 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/02/2014  . Dysmetabolic syndrome 44/04/4740  . Arthritis, degenerative 08/02/2014  . Peripheral vascular disease ([place]) 08/02/2014  . Blood glucose elevated 08/02/2014  . Obstructive sleep apnea on CPAP 08/02/2014  . Vitamin D deficiency 08/02/2014  .  Vitiligo 08/02/2014  . Malignant neoplasm of prostate ([place]) 11/30/2012    Past Surgical History:  Procedure Laterality Date  . Beam Radiation     Prostate using Photons  . ILIAC VEIN ANGIOPLASTY / STENTING    . LEFT HEART CATH  01/11/2012  . NEPHRECTOMY Right 2010   Partial  Family History  Problem Relation Age of Onset  . Hypertension Mother   . Lung cancer Father   . Hypertension Father   . Hyperlipidemia Father   . Diabetes Sister        oldest sister  . Hypertension Sister   . Hypertension Sister     Social History   Socioeconomic History  . Marital status: Married    Spouse name: Mateo Flow  . Number of children: 2  . Years of education: College   . Highest education level: Not on file  Occupational History  . Occupation: RDU  Social Needs  . Financial resource strain: Not hard at all  . Food insecurity:    Worry: Never true    Inability: Never true  . Transportation needs:    Medical: No    Non-medical: No  Tobacco Use  . Smoking status: Former Smoker    Packs/day: 1.00    Years: 20.00    Pack years: 20.00    Types: Cigarettes    Last attempt to quit: 01/12/2012    Years since quitting: 6.0  . Smokeless tobacco: Never Used  Substance and Sexual Activity  . Alcohol use: Yes    Alcohol/week: 0.0 standard drinks    Comment: Minimal alcohol consumption  . Drug use: No  . Sexual activity: Yes    Partners: Female  Lifestyle  . Physical activity:    Days per week: Not on file    Minutes per session: Not on file  . Stress: Not on file  Relationships  . Social connections:    Talks on phone: More than three times a week    Gets together: Three times a week    Attends religious service: More than 4 times per year    Active member of club or organization: Yes    Attends meetings of clubs or organizations: More than 4 times per year    Relationship status: Married  . Intimate partner violence:    Fear of current or ex partner: No    Emotionally  abused: No    Physically abused: No    Forced sexual activity: No  Other Topics Concern  . Not on file  Social History Narrative  . Not on file     Current Outpatient Medications:  .  amLODipine-olmesartan (AZOR) 10-40 MG tablet, Take 1 tablet by mouth daily., Disp: 90 tablet, Rfl: 0 .  aspirin 81 MG tablet, Take 1 tablet by mouth daily., Disp: , Rfl:  .  atorvastatin (LIPITOR) 80 MG tablet, Take 1 tablet (80 mg total) by mouth daily., Disp: 90 tablet, Rfl: 1 .  carvedilol (COREG) 12.5 MG tablet, Take 1 tablet (12.5 mg total) by mouth 2 (two) times daily., Disp: 180 tablet, Rfl: 1 .  cholecalciferol (VITAMIN D) 1000 UNITS tablet, Take 1 tablet by mouth daily., Disp: , Rfl:  .  clopidogrel (PLAVIX) 75 MG tablet, Take 1 tablet (75 mg total) by mouth daily., Disp: 90 tablet, Rfl: 1 .  nitroGLYCERIN (NITROSTAT) 0.4 MG SL tablet, Place 1 tablet (0.4 mg total) under the tongue daily., Disp: 30 tablet, Rfl: 0 .  sildenafil (REVATIO) 20 MG tablet, Take 1-5 tablets (20-100 mg total) by mouth daily as needed., Disp: 10 tablet, Rfl: 5  Allergies  Allergen Reactions  . Lisinopril Swelling    Swelling around mouth, lips     ROS  Constitutional: Negative for fever or weight change.  Respiratory: Negative for cough and shortness of breath.   Cardiovascular: Negative for  chest pain or palpitations.  Gastrointestinal: Negative for abdominal pain, no bowel changes.  Musculoskeletal: Negative for gait problem or joint swelling.  Skin: Negative for rash.  Neurological: Negative for dizziness or headache.  No other specific complaints in a complete review of systems (except as listed in HPI above).  Objective  Vitals:   01/20/18 0920  BP: 100/64  Pulse: 75  Resp: 16  Temp: 97.9 F (36.6 C)  TempSrc: Oral  SpO2: 98%  Weight: 199 lb 11.2 oz (90.6 kg)  Height: 5\' 11"  (1.803 m)    Body mass index is 27.85 kg/m.  Physical Exam  Constitutional: Patient appears well-developed and  well-nourished. No distress.  HENT: Head: Normocephalic and atraumatic. Ears: B TMs ok, no erythema or effusion; Nose: Nose normal. Mouth/Throat: Oropharynx is clear and moist. No oropharyngeal exudate.  Eyes: Conjunctivae and EOM are normal. Pupils are equal, round, and reactive to light. No scleral icterus.  Neck: Normal range of motion. Neck supple. No JVD present. No thyromegaly present.  Cardiovascular: Normal rate, regular rhythm and normal heart sounds.  No murmur heard. No BLE edema. Pulmonary/Chest: Effort normal and breath sounds normal. No respiratory distress. Abdominal: Soft. Bowel sounds are normal, no distension. There is no tenderness. no masses MALE GENITALIA: Not done  RECTAL: not done  Musculoskeletal: Normal range of motion, no joint effusions. No gross deformities Neurological: he is alert and oriented to person, place, and time. No cranial nerve deficit. Coordination, balance, strength, speech and gait are normal.  Skin: Skin is warm and dry. No rash noted. No erythema.  Psychiatric: Patient has a normal mood and affect. behavior is normal. Judgment and thought content normal.  PHQ2/9: Depression screen [facility] 2/9 07/28/2017 11/26/2016 05/20/2016 08/02/2015 06/25/2015  Decreased Interest 0 0 0 0 0  Down, Depressed, Hopeless 0 0 0 0 0  PHQ - 2 Score 0 0 0 0 0     Fall Risk: Fall Risk  07/28/2017 11/26/2016 05/20/2016 08/02/2015 06/25/2015  Falls in the past year? No No No No No      Functional Status Survey: Is the patient deaf or have difficulty hearing?: No Does the patient have difficulty seeing, even when wearing glasses/contacts?: No Does the patient have difficulty concentrating, remembering, or making decisions?: No Does the patient have difficulty walking or climbing stairs?: No Does the patient have difficulty dressing or bathing?: No Does the patient have difficulty doing errands alone such as visiting a doctor's office or shopping?: No    Assessment &  Plan  1. Colon cancer screening  He will schedule appointment   2. Flu vaccine need  - Flu Vaccine QUAD 36+ mos IM  3. Benign essential HTN  - amLODipine-olmesartan (AZOR) 10-40 MG tablet; Take 1 tablet by mouth daily.  Dispense: 90 tablet; Refill: 1 - carvedilol (COREG) 12.5 MG tablet; Take 1 tablet (12.5 mg total) by mouth 2 (two) times daily.  Dispense: 180 tablet; Refill: 1  4. Arteriosclerosis of coronary artery  - atorvastatin (LIPITOR) 80 MG tablet; Take 1 tablet (80 mg total) by mouth daily.  Dispense: 90 tablet; Refill: 1 - carvedilol (COREG) 12.5 MG tablet; Take 1 tablet (12.5 mg total) by mouth 2 (two) times daily.  Dispense: 180 tablet; Refill: 1 - clopidogrel (PLAVIX) 75 MG tablet; Take 1 tablet (75 mg total) by mouth daily.  Dispense: 90 tablet; Refill: 1 - nitroGLYCERIN (NITROSTAT) 0.4 MG SL tablet; Place 1 tablet (0.4 mg total) under the tongue daily.  Dispense: 30 tablet; Refill: 0  5. Dyslipidemia  - atorvastatin (LIPITOR) 80 MG tablet; Take 1 tablet (80 mg total) by mouth daily.  Dispense: 90 tablet; Refill: 1  6. Peripheral blood vessel disorder (H[facility])  - clopidogrel (PLAVIX) 75 MG tablet; Take 1 tablet (75 mg total) by mouth daily.  Dispense: 90 tablet; Refill: 1  7. Encounter for routine history and physical exam for male   -USPSTF grade A and B recommendations reviewed with patient; age-appropriate recommendations, preventive care, screening tests, etc discussed and encouraged; healthy living encouraged; see AVS for patient education given to patient -Discussed importance of 150 minutes of physical activity weekly, eat two servings of fish weekly, eat one serving of tree nuts ( cashews, pistachios, pecans, almonds.Marland Kitchen) every other day, eat 6 servings of fruit/vegetables daily and drink plenty of water and avoid sweet beverages.

## 2018-01-20 NOTE — Patient Instructions (Signed)
Preventive Care 40-64 Years, Male Preventive care refers to lifestyle choices and visits with your health care provider that can promote health and wellness. What does preventive care include?   A yearly physical exam. This is also called an annual well check.  Dental exams once or twice a year.  Routine eye exams. Ask your health care provider how often you should have your eyes checked.  Personal lifestyle choices, including: ? Daily care of your teeth and gums. ? Regular physical activity. ? Eating a healthy diet. ? Avoiding tobacco and drug use. ? Limiting alcohol use. ? Practicing safe sex. ? Taking low-dose aspirin every day starting at age 50. What happens during an annual well check? The services and screenings done by your health care provider during your annual well check will depend on your age, overall health, lifestyle risk factors, and family history of disease. Counseling Your health care provider may ask you questions about your:  Alcohol use.  Tobacco use.  Drug use.  Emotional well-being.  Home and relationship well-being.  Sexual activity.  Eating habits.  Work and work environment. Screening You may have the following tests or measurements:  Height, weight, and BMI.  Blood pressure.  Lipid and cholesterol levels. These may be checked every 5 years, or more frequently if you are over 50 years old.  Skin check.  Lung cancer screening. You may have this screening every year starting at age 55 if you have a 30-pack-year history of smoking and currently smoke or have quit within the past 15 years.  Colorectal cancer screening. All adults should have this screening starting at age 50 and continuing until age 75. Your health care provider may recommend screening at age 45. You will have tests every 1-10 years, depending on your results and the type of screening test. People at increased risk should start screening at an earlier age. Screening tests may  include: ? Guaiac-based fecal occult blood testing. ? Fecal immunochemical test (FIT). ? Stool DNA test. ? Virtual colonoscopy. ? Sigmoidoscopy. During this test, a flexible tube with a tiny camera (sigmoidoscope) is used to examine your rectum and lower colon. The sigmoidoscope is inserted through your anus into your rectum and lower colon. ? Colonoscopy. During this test, a long, thin, flexible tube with a tiny camera (colonoscope) is used to examine your entire colon and rectum.  Prostate cancer screening. Recommendations will vary depending on your family history and other risks.  Hepatitis C blood test.  Hepatitis B blood test.  Sexually transmitted disease (STD) testing.  Diabetes screening. This is done by checking your blood sugar (glucose) after you have not eaten for a while (fasting). You may have this done every 1-3 years. Discuss your test results, treatment options, and if necessary, the need for more tests with your health care provider. Vaccines Your health care provider may recommend certain vaccines, such as:  Influenza vaccine. This is recommended every year.  Tetanus, diphtheria, and acellular pertussis (Tdap, Td) vaccine. You may need a Td booster every 10 years.  Varicella vaccine. You may need this if you have not been vaccinated.  Zoster vaccine. You may need this after age 60.  Measles, mumps, and rubella (MMR) vaccine. You may need at least one dose of MMR if you were born in 1957 or later. You may also need a second dose.  Pneumococcal 13-valent conjugate (PCV13) vaccine. You may need this if you have certain conditions and have not been vaccinated.  Pneumococcal polysaccharide (PPSV23) vaccine.   You may need one or two doses if you smoke cigarettes or if you have certain conditions.  Meningococcal vaccine. You may need this if you have certain conditions.  Hepatitis A vaccine. You may need this if you have certain conditions or if you travel or work in  places where you may be exposed to hepatitis A.  Hepatitis B vaccine. You may need this if you have certain conditions or if you travel or work in places where you may be exposed to hepatitis B.  Haemophilus influenzae type b (Hib) vaccine. You may need this if you have certain risk factors. Talk to your health care provider about which screenings and vaccines you need and how often you need them. This information is not intended to replace advice given to you by your health care provider. Make sure you discuss any questions you have with your health care provider. Document Released: 02/16/2015 Document Revised: 03/12/2017 Document Reviewed: 11/21/2014 Elsevier Interactive Patient Education  2019 Elsevier Inc.  

## 2018-02-09 DIAGNOSIS — Z0120 Encounter for dental examination and cleaning without abnormal findings: Secondary | ICD-10-CM | POA: Diagnosis not present

## 2018-04-19 ENCOUNTER — Encounter: Payer: Self-pay | Admitting: Family Medicine

## 2018-04-19 ENCOUNTER — Other Ambulatory Visit: Payer: Self-pay | Admitting: Family Medicine

## 2018-04-22 ENCOUNTER — Other Ambulatory Visit: Payer: Self-pay | Admitting: Family Medicine

## 2018-04-22 DIAGNOSIS — I10 Essential (primary) hypertension: Secondary | ICD-10-CM

## 2018-04-22 MED ORDER — AMLODIPINE-OLMESARTAN 10-40 MG PO TABS: 1.0000 | ORAL_TABLET | Freq: Every day | ORAL | 1 refills | Status: DC

## 2018-07-26 ENCOUNTER — Encounter: Payer: Self-pay | Admitting: Family Medicine

## 2018-07-28 ENCOUNTER — Encounter: Payer: Self-pay | Admitting: Family Medicine

## 2018-07-28 ENCOUNTER — Other Ambulatory Visit: Payer: Self-pay

## 2018-07-28 ENCOUNTER — Ambulatory Visit (INDEPENDENT_AMBULATORY_CARE_PROVIDER_SITE_OTHER): Payer: Federal, State, Local not specified - PPO | Admitting: Family Medicine

## 2018-07-28 VITALS — BP 120/86 | HR 64 | Temp 99.1°F | Resp 16 | Ht 71.0 in | Wt 199.8 lb

## 2018-07-28 DIAGNOSIS — Z85528 Personal history of other malignant neoplasm of kidney: Secondary | ICD-10-CM

## 2018-07-28 DIAGNOSIS — E8881 Metabolic syndrome: Secondary | ICD-10-CM | POA: Diagnosis not present

## 2018-07-28 DIAGNOSIS — E785 Hyperlipidemia, unspecified: Secondary | ICD-10-CM

## 2018-07-28 DIAGNOSIS — I739 Peripheral vascular disease, unspecified: Secondary | ICD-10-CM | POA: Diagnosis not present

## 2018-07-28 DIAGNOSIS — E559 Vitamin D deficiency, unspecified: Secondary | ICD-10-CM | POA: Diagnosis not present

## 2018-07-28 DIAGNOSIS — I10 Essential (primary) hypertension: Secondary | ICD-10-CM

## 2018-07-28 DIAGNOSIS — I2510 Atherosclerotic heart disease of native coronary artery without angina pectoris: Secondary | ICD-10-CM

## 2018-07-28 DIAGNOSIS — Z8546 Personal history of malignant neoplasm of prostate: Secondary | ICD-10-CM | POA: Diagnosis not present

## 2018-07-28 DIAGNOSIS — G4733 Obstructive sleep apnea (adult) (pediatric): Secondary | ICD-10-CM

## 2018-07-28 DIAGNOSIS — Z9989 Dependence on other enabling machines and devices: Secondary | ICD-10-CM

## 2018-07-28 DIAGNOSIS — N529 Male erectile dysfunction, unspecified: Secondary | ICD-10-CM

## 2018-07-28 MED ORDER — NITROGLYCERIN 0.4 MG SL SUBL: 0.4000 mg | SUBLINGUAL_TABLET | Freq: Every day | SUBLINGUAL | 0 refills | Status: DC

## 2018-07-28 MED ORDER — AMLODIPINE-OLMESARTAN 10-40 MG PO TABS: 1.0000 | ORAL_TABLET | Freq: Every day | ORAL | 1 refills | Status: DC

## 2018-07-28 MED ORDER — TADALAFIL 20 MG PO TABS: 10.0000 mg | ORAL_TABLET | ORAL | 2 refills | Status: DC | PRN

## 2018-07-28 NOTE — Progress Notes (Signed)
Name: Roberto Walker   MRN: [id number]    DOB: April 23, 1960   Date:07/28/2018       Progress Note  Subjective  Chief Complaint  Chief Complaint  Patient presents with  . Hypertension  . Dyslipidemia    HPI  CAD: he had an MI back in 2013, s/p stent placement, he has been asymptomatic since event. He sees Dr. Terance Hart at [facility] a year now Taking Aspirin and Plavix, he denies easy bruising or bleeding. Getting 5 k steps now most days of the week. He also goes up and down stairs at workwithout SOB.  HTN: bp is at goalat home in the 120's/80's usually, taking medication, no side effects of medication. No chest pain or palpitation.Needs refills   Hyperlipidemia: taking Atorvastatin that he gets through the [place], and is compliant , denies myalgia.last labs reviewed and LDL was 58, continue medication   OSA: he is not compliant with CPAP. He states he has been sleeping with head of the bed elevated andwife states snoring is not bad, explained again importance of compliance   History of kidney and prostate cancer : he is going to [facility] to check on prostate once every 6 months. S/p radiation . He is going to see another Urologist soon - Dr. Ali Lowe, Dr. Bridgett Larsson left [facility]. He needs a PSA  PVD: sees Dr. Lucky Cowboy once a year, no claudication, no leg swelling. He is on statin and plavixHe has follow up in September  Pre-diabetes:he has been following life style modification he denies polyphagia, polydipsia or polyuria.  ED: he has difficulty initiating and maintaining erection since prostate radiation therapy, he uses Viagra prn and is getting from [place] but it causes a headache, he is willing to pay for Cialis through [facility]   Patient Active Problem List   Diagnosis Date Noted  . Personal history of kidney cancer 02/06/2015  . Benign essential HTN 08/02/2014  . Arteriosclerosis of coronary artery 08/02/2014  . Dyslipidemia 08/02/2014  . H/O acute myocardial infarction 08/02/2014  . Male  hypogonadism 08/02/2014  . 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/02/2014  . Dysmetabolic syndrome 35/57/3220  . Arthritis, degenerative 08/02/2014  . Peripheral vascular disease ([place]) 08/02/2014  . Blood glucose elevated 08/02/2014  . Obstructive sleep apnea on CPAP 08/02/2014  . Vitamin D deficiency 08/02/2014  . Vitiligo 08/02/2014    Past Surgical History:  Procedure Laterality Date  . Beam Radiation     Prostate using Photons  . ILIAC VEIN ANGIOPLASTY / STENTING    . LEFT HEART CATH  01/11/2012  . NEPHRECTOMY Right 2010   Partial    Family History  Problem Relation Age of Onset  . Hypertension Mother   . Lung cancer Father   . Hypertension Father   . Hyperlipidemia Father   . Diabetes Sister        oldest sister  . Hypertension Sister   . Hypertension Sister     Social History   Socioeconomic History  . Marital status: Married    Spouse name: Mateo Flow  . Number of children: 2  . Years of education: College   . Highest education level: Not on file  Occupational History  . Occupation: RDU  Social Needs  . Financial resource strain: Not hard at all  . Food insecurity    Worry: Never true    Inability: Never true  . Transportation needs    Medical: No    Non-medical: No  Tobacco Use  . Smoking status: Former Smoker  Packs/day: 1.00    Years: 20.00    Pack years: 20.00    Types: Cigarettes    Quit date: 01/12/2012    Years since quitting: 6.5  . Smokeless tobacco: Never Used  Substance and Sexual Activity  . Alcohol use: Yes    Alcohol/week: 0.0 standard drinks    Comment: Minimal alcohol consumption  . Drug use: No  . Sexual activity: Yes    Partners: Female  Lifestyle  . Physical activity    Days per week: 5 days    Minutes per session: 30 min  . Stress: Not at all  Relationships  . Social connections    Talks on phone: More than three times a week    Gets together: Three times a week    Attends religious service: More than 4 times per year     Active member of club or organization: Yes    Attends meetings of clubs or organizations: More than 4 times per year    Relationship status: Married  . Intimate partner violence    Fear of current or ex partner: No    Emotionally abused: No    Physically abused: No    Forced sexual activity: No  Other Topics Concern  . Not on file  Social History Narrative  . Not on file     Current Outpatient Medications:  .  amLODipine-olmesartan (AZOR) 10-40 MG tablet, Take 1 tablet by mouth daily., Disp: 90 tablet, Rfl: 1 .  aspirin 81 MG tablet, Take 1 tablet by mouth daily., Disp: , Rfl:  .  atorvastatin (LIPITOR) 80 MG tablet, Take 1 tablet (80 mg total) by mouth daily., Disp: 90 tablet, Rfl: 1 .  carvedilol (COREG) 12.5 MG tablet, Take 1 tablet (12.5 mg total) by mouth 2 (two) times daily., Disp: 180 tablet, Rfl: 1 .  cholecalciferol (VITAMIN D) 1000 UNITS tablet, Take 1 tablet by mouth daily., Disp: , Rfl:  .  clopidogrel (PLAVIX) 75 MG tablet, Take 1 tablet (75 mg total) by mouth daily., Disp: 90 tablet, Rfl: 1 .  nitroGLYCERIN (NITROSTAT) 0.4 MG SL tablet, Place 1 tablet (0.4 mg total) under the tongue daily., Disp: 30 tablet, Rfl: 0 .  tadalafil (CIALIS) 20 MG tablet, Take 0.5-1 tablets (10-20 mg total) by mouth every other day as needed for erectile dysfunction., Disp: 30 tablet, Rfl: 2  Allergies  Allergen Reactions  . Lisinopril Swelling    Swelling around mouth, lips    I personally reviewed active problem list, medication list, social history with the patient/caregiver today.   ROS  Constitutional: Negative for fever or weight change.  Respiratory: Negative for cough and shortness of breath.   Cardiovascular: Negative for chest pain or palpitations.  Gastrointestinal: Negative for abdominal pain, no bowel changes.  Musculoskeletal: Negative for gait problem or joint swelling.  Skin: Negative for rash.  Neurological: Negative for dizziness or headache.  No other specific  complaints in a complete review of systems (except as listed in HPI above).  Objective  Vitals:   07/28/18 0813  BP: 120/86  Pulse: 64  Resp: 16  Temp: 99.1 F (37.3 C)  TempSrc: Oral  SpO2: 98%  Weight: 199 lb 12.8 oz (90.6 kg)  Height: 5\' 11"  (1.803 m)    Body mass index is 27.87 kg/m.  Physical Exam  Constitutional: Patient appears well-developed and well-nourished. Overweight. No distress.  HEENT: head atraumatic, normocephalic, pupils equal and reactive to light,  neck supple, throat within normal limits Cardiovascular: Normal rate,  regular rhythm and normal heart sounds.  No murmur heard. No BLE edema. Pulmonary/Chest: Effort normal and breath sounds normal. No respiratory distress. Abdominal: Soft.  There is no tenderness. Psychiatric: Patient has a normal mood and affect. behavior is normal. Judgment and thought content normal.  PHQ2/9: Depression screen [facility] 2/9 07/28/2018 07/28/2017 11/26/2016 05/20/2016 08/02/2015  Decreased Interest 0 0 0 0 0  Down, Depressed, Hopeless 0 0 0 0 0  PHQ - 2 Score 0 0 0 0 0  Altered sleeping 0 - - - -  Tired, decreased energy 0 - - - -  Change in appetite 0 - - - -  Feeling bad or failure about yourself  0 - - - -  Trouble concentrating 0 - - - -  Moving slowly or fidgety/restless 0 - - - -  Suicidal thoughts 0 - - - -  PHQ-9 Score 0 - - - -    phq 9 is negative   Fall Risk: Fall Risk  07/28/2018 07/28/2018 07/28/2017 11/26/2016 05/20/2016  Falls in the past year? 0 0 No No No  Number falls in past yr: 0 0 - - -  Injury with Fall? 0 0 - - -     Functional Status Survey: Is the patient deaf or have difficulty hearing?: No Does the patient have difficulty seeing, even when wearing glasses/contacts?: No Does the patient have difficulty concentrating, remembering, or making decisions?: No Does the patient have difficulty walking or climbing stairs?: No Does the patient have difficulty dressing or bathing?: No Does the patient  have difficulty doing errands alone such as visiting a doctor's office or shopping?: No    Assessment & Plan   1. Peripheral vascular disease ([place])  Under the care of vascular surgeon   2. Benign essential HTN  - COMPLETE METABOLIC PANEL WITH GFR - CBC with Differential/Platelet - amLODipine-olmesartan (AZOR) 10-40 MG tablet; Take 1 tablet by mouth daily.  Dispense: 90 tablet; Refill: 1  3. Dysmetabolic syndrome  - Hemoglobin A1c  4. Vitamin D deficiency  - VITAMIN D 25 Hydroxy (Vit-D Deficiency, Fractures)  5. Obstructive sleep apnea on CPAP  He stopped wearing machine a few months ago  6. Personal history of kidney cancer   7. Dyslipidemia  - Lipid panel  8. History of prostate cancer  - PSA  9. ED (erectile dysfunction) of organic origin  - tadalafil (CIALIS) 20 MG tablet; Take 0.5-1 tablets (10-20 mg total) by mouth every other day as needed for erectile dysfunction.  Dispense: 30 tablet; Refill: 2  10. Arteriosclerosis of coronary artery  - nitroGLYCERIN (NITROSTAT) 0.4 MG SL tablet; Place 1 tablet (0.4 mg total) under the tongue daily.  Dispense: 30 tablet; Refill: 0

## 2018-07-29 DIAGNOSIS — Z08 Encounter for follow-up examination after completed treatment for malignant neoplasm: Secondary | ICD-10-CM | POA: Diagnosis not present

## 2018-07-29 DIAGNOSIS — N529 Male erectile dysfunction, unspecified: Secondary | ICD-10-CM | POA: Diagnosis not present

## 2018-07-29 DIAGNOSIS — C61 Malignant neoplasm of prostate: Secondary | ICD-10-CM | POA: Diagnosis not present

## 2018-07-29 DIAGNOSIS — Z923 Personal history of irradiation: Secondary | ICD-10-CM | POA: Diagnosis not present

## 2018-07-29 DIAGNOSIS — Z09 Encounter for follow-up examination after completed treatment for conditions other than malignant neoplasm: Secondary | ICD-10-CM | POA: Diagnosis not present

## 2018-07-29 DIAGNOSIS — Z8546 Personal history of malignant neoplasm of prostate: Secondary | ICD-10-CM | POA: Diagnosis not present

## 2018-07-30 LAB — COMPLETE METABOLIC PANEL WITH GFR
AG Ratio: 1.7 (calc) (ref 1.0–2.5)
ALT: 29 U/L (ref 9–46)
AST: 29 U/L (ref 10–35)
Albumin: 4.4 g/dL (ref 3.6–5.1)
Alkaline phosphatase (APISO): 53 U/L (ref 35–144)
BUN: 15 mg/dL (ref 7–25)
CO2: 19 mmol/L — ABNORMAL LOW (ref 20–32)
Calcium: 10 mg/dL (ref 8.6–10.3)
Chloride: 107 mmol/L (ref 98–110)
Creat: 1.09 mg/dL (ref 0.70–1.33)
GFR, Est African American: 86 mL/min/{1.73_m2} (ref >=60)
GFR, Est Non African American: 74 mL/min/{1.73_m2} (ref >=60)
Globulin: 2.6 g/dL (calc) (ref 1.9–3.7)
Glucose, Bld: 110 mg/dL — ABNORMAL HIGH (ref 65–99)
Potassium: 4.2 mmol/L (ref 3.5–5.3)
Sodium: 140 mmol/L (ref 135–146)
Total Bilirubin: 0.5 mg/dL (ref 0.2–1.2)
Total Protein: 7 g/dL (ref 6.1–8.1)

## 2018-07-30 LAB — CBC WITH DIFFERENTIAL/PLATELET
Absolute Monocytes: 621 cells/uL (ref 200–950)
Basophils Absolute: 22 cells/uL (ref 0–200)
Basophils Relative: 0.3 %
Eosinophils Absolute: 197 cells/uL (ref 15–500)
Eosinophils Relative: 2.7 %
HCT: 39.9 % (ref 38.5–50.0)
Hemoglobin: 13.4 g/dL (ref 13.2–17.1)
Lymphs Abs: 2285 cells/uL (ref 850–3900)
MCH: 32.9 pg (ref 27.0–33.0)
MCHC: 33.6 g/dL (ref 32.0–36.0)
MCV: 98 fL (ref 80.0–100.0)
MPV: 9.2 fL (ref 7.5–12.5)
Monocytes Relative: 8.5 %
Neutro Abs: 4176 cells/uL (ref 1500–7800)
Neutrophils Relative %: 57.2 %
Platelets: 264 10*3/uL (ref 140–400)
RBC: 4.07 10*6/uL — ABNORMAL LOW (ref 4.20–5.80)
RDW: 13.7 % (ref 11.0–15.0)
Total Lymphocyte: 31.3 %
WBC: 7.3 10*3/uL (ref 3.8–10.8)

## 2018-07-30 LAB — LIPID PANEL
Cholesterol: 124 mg/dL (ref <200)
HDL: 61 mg/dL (ref >=40)
LDL Cholesterol (Calc): 49 mg/dL (calc)
Non-HDL Cholesterol (Calc): 63 mg/dL (calc) (ref <130)
Total CHOL/HDL Ratio: 2 (calc) (ref <5.0)
Triglycerides: 59 mg/dL (ref <150)

## 2018-07-30 LAB — HEMOGLOBIN A1C
Hgb A1c MFr Bld: 5.6 % of total Hgb (ref <5.7)
Mean Plasma Glucose: 114 (calc)
eAG (mmol/L): 6.3 (calc)

## 2018-07-30 LAB — PSA: PSA: 0.1 ng/mL (ref <=4.0)

## 2018-07-30 LAB — VITAMIN D 25 HYDROXY (VIT D DEFICIENCY, FRACTURES): Vit D, 25-Hydroxy: 39 ng/mL (ref 30–100)

## 2018-08-18 ENCOUNTER — Other Ambulatory Visit: Payer: Self-pay | Admitting: Family Medicine

## 2018-08-18 DIAGNOSIS — I2510 Atherosclerotic heart disease of native coronary artery without angina pectoris: Secondary | ICD-10-CM

## 2018-08-18 DIAGNOSIS — I10 Essential (primary) hypertension: Secondary | ICD-10-CM

## 2018-09-15 DIAGNOSIS — I2510 Atherosclerotic heart disease of native coronary artery without angina pectoris: Secondary | ICD-10-CM | POA: Diagnosis not present

## 2018-10-29 ENCOUNTER — Other Ambulatory Visit: Payer: Self-pay

## 2018-10-29 ENCOUNTER — Ambulatory Visit (INDEPENDENT_AMBULATORY_CARE_PROVIDER_SITE_OTHER): Payer: Federal, State, Local not specified - PPO

## 2018-10-29 DIAGNOSIS — Z23 Encounter for immunization: Secondary | ICD-10-CM

## 2018-12-01 ENCOUNTER — Encounter: Payer: Self-pay | Admitting: Family Medicine

## 2018-12-01 ENCOUNTER — Ambulatory Visit (INDEPENDENT_AMBULATORY_CARE_PROVIDER_SITE_OTHER): Payer: Federal, State, Local not specified - PPO | Admitting: Family Medicine

## 2018-12-01 ENCOUNTER — Other Ambulatory Visit: Payer: Self-pay

## 2018-12-01 DIAGNOSIS — E785 Hyperlipidemia, unspecified: Secondary | ICD-10-CM

## 2018-12-01 DIAGNOSIS — I2510 Atherosclerotic heart disease of native coronary artery without angina pectoris: Secondary | ICD-10-CM | POA: Diagnosis not present

## 2018-12-01 DIAGNOSIS — I10 Essential (primary) hypertension: Secondary | ICD-10-CM

## 2018-12-01 DIAGNOSIS — I739 Peripheral vascular disease, unspecified: Secondary | ICD-10-CM

## 2018-12-01 DIAGNOSIS — Z8546 Personal history of malignant neoplasm of prostate: Secondary | ICD-10-CM

## 2018-12-01 DIAGNOSIS — E8881 Metabolic syndrome: Secondary | ICD-10-CM | POA: Diagnosis not present

## 2018-12-01 DIAGNOSIS — E559 Vitamin D deficiency, unspecified: Secondary | ICD-10-CM

## 2018-12-01 DIAGNOSIS — Z9989 Dependence on other enabling machines and devices: Secondary | ICD-10-CM

## 2018-12-01 DIAGNOSIS — G4733 Obstructive sleep apnea (adult) (pediatric): Secondary | ICD-10-CM

## 2018-12-01 DIAGNOSIS — Z85528 Personal history of other malignant neoplasm of kidney: Secondary | ICD-10-CM

## 2018-12-01 MED ORDER — AMLODIPINE-OLMESARTAN 10-40 MG PO TABS: 1.0000 | ORAL_TABLET | Freq: Every day | ORAL | 1 refills | Status: DC

## 2018-12-01 NOTE — Progress Notes (Signed)
Name: Roberto Walker   MRN: [id number]    DOB: 1960/02/08   Date:12/01/2018       Progress Note  Subjective  Chief Complaint  Chief Complaint  Patient presents with  . Medication Refill  . Hypertension    Denies any symptoms  . Sleep Apnea    Wears off and on gets around 4 hours nightly  . PVD  . Hyperlipidemia    I connected with  [name]  on 12/01/18 at 11:40 AM EDT by a video enabled telemedicine application and verified that I am speaking with the correct person using two identifiers.  I discussed the limitations of evaluation and management by telemedicine and the availability of in person appointments. The patient expressed understanding and agreed to proceed. Staff also discussed with the patient that there may be a patient responsible charge related to this service. Patient Location: at work  Provider Location: [place]   HPI  CAD: he had an MI back in 2013, s/p stent placement, he has been asymptomatic since event. [facility] nowTaking Aspirin and Plavix, he denies easy bruising or bleeding. Getting 5 thousand  steps most days of the week.He also goes up and down stairs at workwithout SOB or decrease in exercise tolerance.  HTN: bp is at [employer] home, he states last time he checked 117/80's taking medication, no side effects of medication. No chest pain or palpitation.Denies dizziness or orthostatic changes  Hyperlipidemia: taking Atorvastatin that he gets through the [place], and is compliant , denies myalgia.last labs reviewed and LDL was 58, continue medication  OSA: he states he has been wearing it for about 4 hours per night but not very consistently   History of kidney and prostate cancer : he is going to [facility] to check on prostate once every 6 months. S/p radiation . He is going to see another Urologist soon - Dr. Ali Lowe, Dr. Bridgett Larsson left [facility]. Last PSA was 0.1   PVD: sees Dr. Lucky Cowboy once a year, no  claudication, no leg swelling. He is on statin and plavix. Unchanged   Pre-diabetes:he has been following life style modification he denies polyphagia, polydipsia or polyuria.  ED: he has difficulty initiating and maintaining erection since prostate radiation therapy, he is now on Cialis    Patient Active Problem List   Diagnosis Date Noted  . Personal history of kidney cancer 02/06/2015  . Benign essential HTN 08/02/2014  . Arteriosclerosis of coronary artery 08/02/2014  . Dyslipidemia 08/02/2014  . H/O acute myocardial infarction 08/02/2014  . 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/02/2014  . Dysmetabolic syndrome [id number]  . Arthritis, degenerative 08/02/2014  . Peripheral vascular disease ([place]) 08/02/2014  . Blood glucose elevated 08/02/2014  . Obstructive sleep apnea on CPAP 08/02/2014  . Vitamin D deficiency 08/02/2014  . Vitiligo 08/02/2014    Past Surgical History:  Procedure Laterality Date  . Beam Radiation     Prostate using Photons  . ILIAC VEIN ANGIOPLASTY / STENTING    . LEFT HEART CATH  01/11/2012  . NEPHRECTOMY Right 2010   Partial    Family History  Problem Relation Age of Onset  . Hypertension Mother   . Lung cancer Father   . Hypertension Father   . Hyperlipidemia Father   . Diabetes Sister        oldest sister  . Hypertension Sister   . Hypertension Sister     Social History  Socioeconomic History  . Marital status: Married    Spouse name: Mateo Flow  . Number of children: 2  . Years of education: College   . Highest education level: Not on file  Occupational History  . Occupation: RDU  Social Needs  . Financial resource strain: Not hard at all  . Food insecurity    Worry: Never true    Inability: Never true  . Transportation needs    Medical: No    Non-medical: No  Tobacco Use  . Smoking status: Former Smoker    Packs/day: 1.00    Years: 20.00    Pack years: 20.00    Types: Cigarettes    Quit date:  01/12/2012    Years since quitting: 6.8  . Smokeless tobacco: Never Used  Substance and Sexual Activity  . Alcohol use: Yes    Alcohol/week: 0.0 standard drinks    Comment: Minimal alcohol consumption  . Drug use: No  . Sexual activity: Yes    Partners: Female  Lifestyle  . Physical activity    Days per week: 5 days    Minutes per session: 30 min  . Stress: Not at all  Relationships  . Social connections    Talks on phone: More than three times a week    Gets together: Three times a week    Attends religious service: More than 4 times per year    Active member of club or organization: Yes    Attends meetings of clubs or organizations: More than 4 times per year    Relationship status: Married  . Intimate partner violence    Fear of current or ex partner: No    Emotionally abused: No    Physically abused: No    Forced sexual activity: No  Other Topics Concern  . Not on file  Social History Narrative  . Not on file     Current Outpatient Medications:  .  amLODipine-olmesartan (AZOR) 10-40 MG tablet, Take 1 tablet by mouth daily., Disp: 90 tablet, Rfl: 1 .  aspirin 81 MG tablet, Take 1 tablet by mouth daily., Disp: , Rfl:  .  atorvastatin (LIPITOR) 80 MG tablet, Take 1 tablet (80 mg total) by mouth daily., Disp: 90 tablet, Rfl: 1 .  carvedilol (COREG) 12.5 MG tablet, Take 1 tablet (12.5 mg total) by mouth 2 (two) times daily., Disp: 180 tablet, Rfl: 1 .  cholecalciferol (VITAMIN D) 1000 UNITS tablet, Take 1 tablet by mouth daily., Disp: , Rfl:  .  clopidogrel (PLAVIX) 75 MG tablet, Take 1 tablet (75 mg total) by mouth daily., Disp: 90 tablet, Rfl: 1 .  nitroGLYCERIN (NITROSTAT) 0.4 MG SL tablet, Place 1 tablet (0.4 mg total) under the tongue daily., Disp: 30 tablet, Rfl: 0 .  tadalafil (CIALIS) 20 MG tablet, Take 0.5-1 tablets (10-20 mg total) by mouth every other day as needed for erectile dysfunction., Disp: 30 tablet, Rfl: 2  Allergies  Allergen Reactions  . Lisinopril  Swelling    Swelling around mouth, lips    I personally reviewed active problem list, medication list, allergies, family history, social history, health maintenance with the patient/caregiver today.   ROS  Ten systems reviewed and is negative except as mentioned in HPI   Objective  Virtual encounter, vitals not obtained.  There is no height or weight on file to calculate BMI.  Physical Exam  Awake, alert and oriented  PHQ2/9: Depression screen [facility] 2/9 12/01/2018 07/28/2018 07/28/2017 11/26/2016 05/20/2016  Decreased Interest 0 0 0 0  0  Down, Depressed, Hopeless 0 0 0 0 0  PHQ - 2 Score 0 0 0 0 0  Altered sleeping 0 0 - - -  Tired, decreased energy 0 0 - - -  Change in appetite 0 0 - - -  Feeling bad or failure about yourself  0 0 - - -  Trouble concentrating 0 0 - - -  Moving slowly or fidgety/restless 0 0 - - -  Suicidal thoughts 0 0 - - -  PHQ-9 Score 0 0 - - -  Difficult doing work/chores Not difficult at all - - - -   PHQ-2/9 Result is negative.    Fall Risk: Fall Risk  12/01/2018 07/28/2018 07/28/2018 07/28/2017 11/26/2016  Falls in the past year? 0 0 0 No No  Number falls in past yr: 0 0 0 - -  Injury with Fall? 0 0 0 - -     Assessment & Plan  1. Benign essential HTN  We received a note from [employer] insurance stating he is not compliant with medication, however his bp has been well controlled and he states had no gap in medication - amLODipine-olmesartan (AZOR) 10-40 MG tablet; Take 1 tablet by mouth daily.  Dispense: 90 tablet; Refill: 1  2. Arteriosclerosis of coronary artery  Recently seen by cardiologist   3. Dysmetabolic syndrome  He has a history of pre-diabetes but reviewed last labs and A1C was normal   4. Vitamin D deficiency  Normal level now   5. Dyslipidemia  On statin therapy and gets medication through the [place]  6. Peripheral vascular disease ([place])  Denies claudication  7. Obstructive sleep apnea on CPAP  Wearing for about 4  hours per night   8. Personal history of kidney cancer   9. History of prostate cancer  Last PSA was 0.1    I discussed the assessment and treatment plan with the patient. The patient was provided an opportunity to ask questions and all were answered. The patient agreed with the plan and demonstrated an understanding of the instructions.  The patient was advised to call back or seek an in-person evaluation if the symptoms worsen or if the condition fails to improve as anticipated.  I provided 25  minutes of non-face-to-face time during this encounter.

## 2018-12-13 ENCOUNTER — Other Ambulatory Visit: Payer: Self-pay

## 2018-12-13 ENCOUNTER — Encounter (INDEPENDENT_AMBULATORY_CARE_PROVIDER_SITE_OTHER): Payer: Self-pay | Admitting: Nurse Practitioner

## 2018-12-13 ENCOUNTER — Ambulatory Visit (INDEPENDENT_AMBULATORY_CARE_PROVIDER_SITE_OTHER): Payer: Federal, State, Local not specified - PPO

## 2018-12-13 ENCOUNTER — Ambulatory Visit (INDEPENDENT_AMBULATORY_CARE_PROVIDER_SITE_OTHER): Payer: Federal, State, Local not specified - PPO | Admitting: Nurse Practitioner

## 2018-12-13 VITALS — BP 124/78 | HR 58 | Resp 16 | Wt 200.0 lb

## 2018-12-13 DIAGNOSIS — E785 Hyperlipidemia, unspecified: Secondary | ICD-10-CM

## 2018-12-13 DIAGNOSIS — I10 Essential (primary) hypertension: Secondary | ICD-10-CM | POA: Diagnosis not present

## 2018-12-13 DIAGNOSIS — I739 Peripheral vascular disease, unspecified: Secondary | ICD-10-CM

## 2018-12-13 NOTE — Progress Notes (Signed)
SUBJECTIVE:  Patient ID: Roberto Walker, male    DOB: 1960-04-07, 58 y.o.   MRN: [id number] Chief Complaint  Patient presents with  . Follow-up    ultrasound follow up    HPI  Roberto Walker is a 58 y.o. male The patient returns to the office for followup and review of the noninvasive studies. There have been no interval changes in lower extremity symptoms. No interval shortening of the patient's claudication distance or development of rest pain symptoms. No new ulcers or wounds have occurred since the last visit.  There have been no significant changes to the patient's overall health care.  The patient denies amaurosis fugax or recent TIA symptoms. There are no recent neurological changes noted. The patient denies history of DVT, PE or superficial thrombophlebitis. The patient denies recent episodes of angina or shortness of breath.   ABI Rt=1.24 and Lt=1.09  (previous ABI's Rt=1.25 and Lt=1.06) Duplex ultrasound of the right lower extremity reveals triphasic tibial artery waveforms, the left lower extremity has biphasic tibial artery waveforms.  There is strong toe waveforms bilaterally.  Past Medical History:  Diagnosis Date  . Hypercalcemia   . Hyperlipidemia   . Hypertension   . Metabolic syndrome   . Microscopic hematuria   . Osteoarthrosis   . Peripheral vascular disease ([place])   . Prostate cancer ([place])   . Renal cell carcinoma ([place])   . Vitamin D deficiency   . Vitiligo     Past Surgical History:  Procedure Laterality Date  . Beam Radiation     Prostate using Photons  . ILIAC VEIN ANGIOPLASTY / STENTING    . LEFT HEART CATH  01/11/2012  . NEPHRECTOMY Right 2010   Partial    Social History   Socioeconomic History  . Marital status: Married    Spouse name: Mateo Flow  . Number of children: 2  . Years of education: College   . Highest education level: Not on file  Occupational History  . Occupation: RDU  Social Needs  . Financial resource strain: Not hard  at all  . Food insecurity    Worry: Never true    Inability: Never true  . Transportation needs    Medical: No    Non-medical: No  Tobacco Use  . Smoking status: Former Smoker    Packs/day: 1.00    Years: 20.00    Pack years: 20.00    Types: Cigarettes    Quit date: 01/12/2012    Years since quitting: 6.9  . Smokeless tobacco: Never Used  Substance and Sexual Activity  . Alcohol use: Yes    Alcohol/week: 0.0 standard drinks    Comment: Minimal alcohol consumption  . Drug use: No  . Sexual activity: Yes    Partners: Female  Lifestyle  . Physical activity    Days per week: 5 days    Minutes per session: 30 min  . Stress: Not at all  Relationships  . Social connections    Talks on phone: More than three times a week    Gets together: Three times a week    Attends religious service: More than 4 times per year    Active member of club or organization: Yes    Attends meetings of clubs or organizations: More than 4 times per year    Relationship status: Married  . Intimate partner violence    Fear of current or ex partner: No    Emotionally abused: No    Physically abused: No  Forced sexual activity: No  Other Topics Concern  . Not on file  Social History Narrative  . Not on file    Family History  Problem Relation Age of Onset  . Hypertension Mother   . Lung cancer Father   . Hypertension Father   . Hyperlipidemia Father   . Diabetes Sister        oldest sister  . Hypertension Sister   . Hypertension Sister     Allergies  Allergen Reactions  . Lisinopril Swelling    Swelling around mouth, lips     Review of Systems   Review of Systems: Negative Unless Checked Constitutional: [] Weight loss  [] Fever  [] Chills Cardiac: [] Chest pain   []  Atrial Fibrillation  [] Palpitations   [] Shortness of breath when laying flat   [] Shortness of breath with exertion. [] Shortness of breath at rest Vascular:  [] Pain in legs with walking   [] Pain in legs with standing  [] Pain in legs when laying flat   [] Claudication    [] Pain in feet when laying flat    [] History of DVT   [] Phlebitis   [] Swelling in legs   [] Varicose veins   [] Non-healing ulcers Pulmonary:   [] Uses home oxygen   [] Productive cough   [] Hemoptysis   [] Wheeze  [] COPD   [] Asthma Neurologic:  [] Dizziness   [] Seizures  [] Blackouts [] History of stroke   [] History of TIA  [] Aphasia   [] Temporary Blindness   [] Weakness or numbness in arm   [] Weakness or numbness in leg Musculoskeletal:   [] Joint swelling   [] Joint pain   [] Low back pain  []  History of Knee Replacement [x] Arthritis [] back Surgeries  []  Spinal Stenosis    Hematologic:  [] Easy bruising  [] Easy bleeding   [] Hypercoagulable state   [x] Anemic Gastrointestinal:  [] Diarrhea   [] Vomiting  [] Gastroesophageal reflux/heartburn   [] Difficulty swallowing. [] Abdominal pain Genitourinary:  [x] Chronic kidney disease   [] Difficult urination  [] Anuric   [] Blood in urine [] Frequent urination  [] Burning with urination   [] Hematuria Skin:  [] Rashes   [] Ulcers [] Wounds Psychological:  [] History of anxiety   []  History of major depression  []  Memory Difficulties      OBJECTIVE:   Physical Exam  BP 124/78 (BP Location: Right Arm)   Pulse (!) 58   Resp 16   Wt 200 lb (90.7 kg)   BMI 27.89 kg/m   Gen: WD/WN, NAD Head: [place]/AT, No temporalis wasting.  Ear/Nose/Throat: Hearing grossly intact, nares w/o erythema or drainage Eyes: PER, EOMI, sclera nonicteric.  Neck: Supple, no masses.  No JVD.  Pulmonary:  Good air movement, no use of accessory muscles.  Cardiac: RRR Vascular:  Vessel Right Left  Radial Palpable Palpable  Dorsalis Pedis Palpable Palpable  Posterior Tibial Palpable Palpable   Gastrointestinal: soft, non-distended. No guarding/no peritoneal signs.  Musculoskeletal: M/S 5/5 throughout.  No deformity or atrophy.  Neurologic: Pain and light touch intact in extremities.  Symmetrical.  Speech is fluent. Motor exam as listed above.  Psychiatric: Judgment intact, Mood & affect appropriate for pt's clinical situation. Dermatologic:  Vitiligo present on hands. No changes consistent with cellulitis. Lymph : No Cervical lymphadenopathy, no lichenification or skin changes of chronic lymphedema.       ASSESSMENT AND PLAN:  1. Peripheral vascular disease ([place])  Recommend:  The patient has evidence of atherosclerosis of the lower extremities with claudication.  The patient does not voice lifestyle limiting changes at this point in time.  Noninvasive studies do not suggest clinically significant change.  No  invasive studies, angiography or surgery at this time The patient should continue walking and begin a more formal exercise program.  The patient should continue antiplatelet therapy and aggressive treatment of the lipid abnormalities  No changes in the patient's medications at this time  The patient should continue wearing graduated compression socks 10-15 mmHg strength to control the mild edema.   - VAS [place] ABI WITH/WO TBI; Future  2. Dyslipidemia Continue statin as ordered and reviewed, no changes at this time   3. Benign essential HTN Continue antihypertensive medications as already ordered, these medications have been reviewed and there are no changes at this time.    Current Outpatient Medications on File Prior to Visit  Medication Sig Dispense Refill  . amLODipine-olmesartan (AZOR) 10-40 MG tablet Take 1 tablet by mouth daily. 90 tablet 1  . aspirin 81 MG tablet Take 1 tablet by mouth daily.    Marland Kitchen atorvastatin (LIPITOR) 80 MG tablet Take 1 tablet (80 mg total) by mouth daily. 90 tablet 1  . carvedilol (COREG) 12.5 MG tablet Take 1 tablet (12.5 mg total) by mouth 2 (two) times daily. 180 tablet 1  . cholecalciferol (VITAMIN D) 1000 UNITS tablet Take 1 tablet by mouth daily.    . clopidogrel (PLAVIX) 75 MG tablet Take 1 tablet (75 mg total) by mouth daily. 90 tablet 1  . nitroGLYCERIN (NITROSTAT) 0.4 MG SL  tablet Place 1 tablet (0.4 mg total) under the tongue daily. 30 tablet 0  . tadalafil (CIALIS) 20 MG tablet Take 0.5-1 tablets (10-20 mg total) by mouth every other day as needed for erectile dysfunction. 30 tablet 2   No current facility-administered medications on file prior to visit.     There are no Patient Instructions on file for this visit. No follow-ups on file.   Kris Hartmann, NP  This note was completed with Sales executive.  Any errors are purely unintentional.

## 2019-01-25 ENCOUNTER — Encounter: Payer: Self-pay | Admitting: Family Medicine

## 2019-01-25 ENCOUNTER — Ambulatory Visit (INDEPENDENT_AMBULATORY_CARE_PROVIDER_SITE_OTHER): Payer: Federal, State, Local not specified - PPO | Admitting: Family Medicine

## 2019-01-25 ENCOUNTER — Other Ambulatory Visit: Payer: Self-pay

## 2019-01-25 VITALS — BP 124/88 | HR 73 | Temp 97.1°F | Resp 16 | Ht 71.0 in | Wt 201.8 lb

## 2019-01-25 DIAGNOSIS — Z9989 Dependence on other enabling machines and devices: Secondary | ICD-10-CM

## 2019-01-25 DIAGNOSIS — E785 Hyperlipidemia, unspecified: Secondary | ICD-10-CM | POA: Diagnosis not present

## 2019-01-25 DIAGNOSIS — N529 Male erectile dysfunction, unspecified: Secondary | ICD-10-CM

## 2019-01-25 DIAGNOSIS — I739 Peripheral vascular disease, unspecified: Secondary | ICD-10-CM

## 2019-01-25 DIAGNOSIS — I2510 Atherosclerotic heart disease of native coronary artery without angina pectoris: Secondary | ICD-10-CM

## 2019-01-25 DIAGNOSIS — E8881 Metabolic syndrome: Secondary | ICD-10-CM

## 2019-01-25 DIAGNOSIS — E559 Vitamin D deficiency, unspecified: Secondary | ICD-10-CM

## 2019-01-25 DIAGNOSIS — I10 Essential (primary) hypertension: Secondary | ICD-10-CM

## 2019-01-25 DIAGNOSIS — Z8546 Personal history of malignant neoplasm of prostate: Secondary | ICD-10-CM

## 2019-01-25 DIAGNOSIS — G4733 Obstructive sleep apnea (adult) (pediatric): Secondary | ICD-10-CM

## 2019-01-25 NOTE — Progress Notes (Signed)
Name: Roberto Walker   MRN: [id number]    DOB: 01-22-1961   Date:01/25/2019       Progress Note  Subjective  Chief Complaint  Chief Complaint  Patient presents with  . Hypertension  . Hyperlipidemia  . Hyperglycemia    HPI  HTN: bp is at [employer] home, he states last time he checked 117/80's taking medication, no side effects of medication. No chest pain or palpitation.No edema or orthopnea.   Hyperlipidemia: taking Atorvastatinthat he gets through the [place], and is compliant , denies myalgia.last labs reviewed and LDL was 49 , HDL also at range, continue medication  OSA: he states he has been wearing it for about 4 hours per night but not very consistently. No headaches in the mornings. He wakes up feeling refreshed   History of kidney and prostate cancer : he is going to [facility] to check on prostate once every 12  months. S/p radiation .He is going to see another Urologist/Oncologist   Dr. Ali Lowe. Last PSA was 0.05 July 2018  PVD: sees Dr. Lucky Cowboy once a year, no claudication, no leg swelling, s/p stent placed on left leg and is doing well, recent visit . He is on statin and plavix. Unchanged   Pre-diabetes: he has been following life style modification he denies polyphagia, polydipsia or polyuria. He is eats a balanced diet, grains, fish, chicken, vegetables. He does not eat fast food   ED: he has difficulty initiating and maintaining erection since prostate radiation therapy, he is now on Cialis . Unchanged    Patient Active Problem List   Diagnosis Date Noted  . Personal history of kidney cancer 02/06/2015  . Benign essential HTN 08/02/2014  . Arteriosclerosis of coronary artery 08/02/2014  . Dyslipidemia 08/02/2014  . H/O acute myocardial infarction 08/02/2014  . 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/02/2014  . Dysmetabolic syndrome [id number]  . Arthritis, degenerative 08/02/2014  . Peripheral vascular disease ([place]) 08/02/2014  . Blood glucose  elevated 08/02/2014  . Obstructive sleep apnea on CPAP 08/02/2014  . Vitamin D deficiency 08/02/2014  . Vitiligo 08/02/2014    Past Surgical History:  Procedure Laterality Date  . Beam Radiation     Prostate using Photons  . ILIAC VEIN ANGIOPLASTY / STENTING    . LEFT HEART CATH  01/11/2012  . NEPHRECTOMY Right 2010   Partial    Family History  Problem Relation Age of Onset  . Hypertension Mother   . Lung cancer Father   . Hypertension Father   . Hyperlipidemia Father   . Diabetes Sister        oldest sister  . Hypertension Sister   . Hypertension Sister     Social History   Socioeconomic History  . Marital status: Married    Spouse name: Mateo Flow  . Number of children: 2  . Years of education: College   . Highest education level: Not on file  Occupational History  . 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 Smoking status: Former Smoker    Packs/day: 1.00    Years: 20.00    Pack years: 20.00    Types: Cigarettes    Quit date: 01/12/2012    Years since quitting: 7.0  . Smokeless tobacco: Never Used  Substance and Sexual Activity  . Alcohol use: Yes    Alcohol/week: 0.0 standard drinks    Comment: Minimal alcohol consumption  . Drug use: No  . Sexual activity: Yes    Partners: Female  Other Topics Concern  . Not on file  Social History Narrative  . Not on file   Social Determinants of Health   Financial Resource Strain:   . Difficulty of Paying Living Expenses: Not on file  Food Insecurity:   . Worried About Charity fundraiser in the Last Year: Not on file  . Ran Out of Food in the Last Year: Not on file  Transportation Needs:   . Lack of Transportation (Medical): Not on file  . Lack of Transportation (Non-Medical): Not on file  Physical Activity: Sufficiently Active  . Days of Exercise per Week: 5 days  . Minutes of Exercise per Session: 30 min  Stress: No Stress Concern Present  . Feeling of Stress : Not at all  Social Connections:   . Frequency of  Communication with Friends and Family: Not on file  . Frequency of Social Gatherings with Friends and Family: Not on file  . Attends Religious Services: Not on file  . Active Member of Clubs or Organizations: Not on file  . Attends Archivist Meetings: Not on file  . Marital Status: Not on file  Intimate Partner Violence:   . Fear of Current or Ex-Partner: Not on file  . Emotionally Abused: Not on file  . Physically Abused: Not on file  . Sexually Abused: Not on file     Current Outpatient Medications:  .  amLODipine-olmesartan (AZOR) 10-40 MG tablet, Take 1 tablet by mouth daily., Disp: 90 tablet, Rfl: 1 .  aspirin 81 MG tablet, Take 1 tablet by mouth daily., Disp: , Rfl:  .  atorvastatin (LIPITOR) 80 MG tablet, Take 1 tablet (80 mg total) by mouth daily., Disp: 90 tablet, Rfl: 1 .  carvedilol (COREG) 12.5 MG tablet, Take 1 tablet (12.5 mg total) by mouth 2 (two) times daily., Disp: 180 tablet, Rfl: 1 .  cholecalciferol (VITAMIN D) 1000 UNITS tablet, Take 1 tablet by mouth daily., Disp: , Rfl:  .  clopidogrel (PLAVIX) 75 MG tablet, Take 1 tablet (75 mg total) by mouth daily., Disp: 90 tablet, Rfl: 1 .  nitroGLYCERIN (NITROSTAT) 0.4 MG SL tablet, Place 1 tablet (0.4 mg total) under the tongue daily., Disp: 30 tablet, Rfl: 0 .  tadalafil (CIALIS) 20 MG tablet, Take 0.5-1 tablets (10-20 mg total) by mouth every other day as needed for erectile dysfunction., Disp: 30 tablet, Rfl: 2  Allergies  Allergen Reactions  . Lisinopril Swelling    Swelling around mouth, lips    I personally reviewed active problem list, medication list, allergies, family history, social history, health maintenance with the patient/caregiver today.   ROS  Constitutional: Negative for fever or weight change.  Respiratory: Negative for cough and shortness of breath.   Cardiovascular: Negative for chest pain or palpitations.  Gastrointestinal: Negative for abdominal pain, no bowel changes.   Musculoskeletal: Negative for gait problem or joint swelling.  Skin: Negative for rash.  Neurological: Negative for dizziness or headache.  No other specific complaints in a complete review of systems (except as listed in HPI above).   Objective  Vitals:   01/25/19 0738  BP: 124/88  Pulse: 73  Resp: 16  Temp: (!) 97.1 F (36.2 C)  TempSrc: Temporal  SpO2: 96%  Weight: 201 lb 12.8 oz (91.5 kg)  Height: 5\' 11"  (1.803 m)    Body mass index is 28.15 kg/m.  Physical Exam  Constitutional: Patient appears well-developed and well-nourished. Overweight.  No distress.  HEENT: head atraumatic, normocephalic, pupils equal  and reactive to light Cardiovascular: Normal rate, regular rhythm and normal heart sounds.  No murmur heard. No BLE edema. Pulmonary/Chest: Effort normal and breath sounds normal. No respiratory distress. Abdominal: Soft.  There is no tenderness. Psychiatric: Patient has a normal mood and affect. behavior is normal. Judgment and thought content normal.  PHQ2/9: Depression screen [facility] 2/9 01/25/2019 12/01/2018 07/28/2018 07/28/2017 11/26/2016  Decreased Interest 0 0 0 0 0  Down, Depressed, Hopeless 0 0 0 0 0  PHQ - 2 Score 0 0 0 0 0  Altered sleeping 0 0 0 - -  Tired, decreased energy 0 0 0 - -  Change in appetite 0 0 0 - -  Feeling bad or failure about yourself  0 0 0 - -  Trouble concentrating 0 0 0 - -  Moving slowly or fidgety/restless 0 0 0 - -  Suicidal thoughts 0 0 0 - -  PHQ-9 Score 0 0 0 - -  Difficult doing work/chores - Not difficult at all - - -    phq 9 is negative   Fall Risk: Fall Risk  01/25/2019 12/01/2018 07/28/2018 07/28/2018 07/28/2017  Falls in the past year? 0 0 0 0 No  Number falls in past yr: 0 0 0 0 -  Injury with Fall? 0 0 0 0 -     Functional Status Survey: Is the patient deaf or have difficulty hearing?: No Does the patient have difficulty seeing, even when wearing glasses/contacts?: No Does the patient have difficulty  concentrating, remembering, or making decisions?: No Does the patient have difficulty walking or climbing stairs?: No Does the patient have difficulty dressing or bathing?: No Does the patient have difficulty doing errands alone such as visiting a doctor's office or shopping?: No    Assessment & Plan  1. Peripheral vascular disease ([place])  Continue medication   2. ED (erectile dysfunction) of organic origin  [facility] C[facility]  3. Dyslipidemia  Continue medication  4. Arteriosclerosis of coronary artery   5. Dysmetabolic syndrome  Doing well on diet  6. History of prostate cancer  Continue yearly follow up  7. Benign essential HTN  At goal   8. Vitamin D deficiency  Continue vitamin D   9. Obstructive sleep apnea on CPAP  Continue medication

## 2019-01-31 ENCOUNTER — Ambulatory Visit: Payer: Federal, State, Local not specified - PPO | Admitting: Family Medicine

## 2019-04-13 DIAGNOSIS — Z23 Encounter for immunization: Secondary | ICD-10-CM | POA: Diagnosis not present

## 2019-06-01 ENCOUNTER — Ambulatory Visit: Payer: Federal, State, Local not specified - PPO | Admitting: Family Medicine

## 2019-06-29 DIAGNOSIS — E785 Hyperlipidemia, unspecified: Secondary | ICD-10-CM | POA: Diagnosis not present

## 2019-06-29 DIAGNOSIS — C61 Malignant neoplasm of prostate: Secondary | ICD-10-CM | POA: Diagnosis not present

## 2019-06-29 DIAGNOSIS — I10 Essential (primary) hypertension: Secondary | ICD-10-CM | POA: Diagnosis not present

## 2019-06-29 DIAGNOSIS — I2510 Atherosclerotic heart disease of native coronary artery without angina pectoris: Secondary | ICD-10-CM | POA: Diagnosis not present

## 2019-07-28 ENCOUNTER — Other Ambulatory Visit: Payer: Self-pay

## 2019-07-28 ENCOUNTER — Ambulatory Visit (INDEPENDENT_AMBULATORY_CARE_PROVIDER_SITE_OTHER): Payer: Federal, State, Local not specified - PPO | Admitting: Family Medicine

## 2019-07-28 ENCOUNTER — Encounter: Payer: Self-pay | Admitting: Family Medicine

## 2019-07-28 VITALS — BP 122/90 | HR 58 | Temp 97.1°F | Resp 16 | Ht 71.0 in | Wt 203.7 lb

## 2019-07-28 DIAGNOSIS — Z1211 Encounter for screening for malignant neoplasm of colon: Secondary | ICD-10-CM

## 2019-07-28 DIAGNOSIS — Z0000 Encounter for general adult medical examination without abnormal findings: Secondary | ICD-10-CM | POA: Diagnosis not present

## 2019-07-28 DIAGNOSIS — E785 Hyperlipidemia, unspecified: Secondary | ICD-10-CM | POA: Diagnosis not present

## 2019-07-28 DIAGNOSIS — Z8546 Personal history of malignant neoplasm of prostate: Secondary | ICD-10-CM | POA: Diagnosis not present

## 2019-07-28 DIAGNOSIS — E559 Vitamin D deficiency, unspecified: Secondary | ICD-10-CM

## 2019-07-28 DIAGNOSIS — I10 Essential (primary) hypertension: Secondary | ICD-10-CM | POA: Diagnosis not present

## 2019-07-28 DIAGNOSIS — K4090 Unilateral inguinal hernia, without obstruction or gangrene, not specified as recurrent: Secondary | ICD-10-CM

## 2019-07-28 DIAGNOSIS — Z131 Encounter for screening for diabetes mellitus: Secondary | ICD-10-CM

## 2019-07-28 DIAGNOSIS — Z85528 Personal history of other malignant neoplasm of kidney: Secondary | ICD-10-CM | POA: Diagnosis not present

## 2019-07-28 NOTE — Patient Instructions (Addendum)
Preventive Care 60-60 Years Old, Male Preventive care refers to lifestyle choices and visits with your health care provider that can promote health and wellness. This includes:  A yearly physical exam. This is also called an annual well check.  Regular dental and eye exams.  Immunizations.  Screening for certain conditions.  Healthy lifestyle choices, such as eating a healthy diet, getting regular exercise, not using drugs or products that contain nicotine and tobacco, and limiting alcohol use. What can I expect for my preventive care visit? Physical exam Your health care provider will check:  Height and weight. These may be used to calculate body mass index (BMI), which is a measurement that tells if you are at a healthy weight.  Heart rate and blood pressure.  Your skin for abnormal spots. Counseling Your health care provider may ask you questions about:  Alcohol, tobacco, and drug use.  Emotional well-being.  Home and relationship well-being.  Sexual activity.  Eating habits.  Work and work Statistician. What immunizations do I need?  Influenza (flu) vaccine  This is recommended every year. Tetanus, diphtheria, and pertussis (Tdap) vaccine  You may need a Td booster every 10 years. Varicella (chickenpox) vaccine  You may need this vaccine if you have not already been vaccinated. Zoster (shingles) vaccine  You may need this after age 65. Measles, mumps, and rubella (MMR) vaccine  You may need at least one dose of MMR if you were born in 1957 or later. You may also need a second dose. Pneumococcal conjugate (PCV13) vaccine  You may need this if you have certain conditions and were not previously vaccinated. Pneumococcal polysaccharide (PPSV23) vaccine  You may need one or two doses if you smoke cigarettes or if you have certain conditions. Meningococcal conjugate (MenACWY) vaccine  You may need this if you have certain conditions. Hepatitis A  vaccine  You may need this if you have certain conditions or if you travel or work in places where you may be exposed to hepatitis A. Hepatitis B vaccine  You may need this if you have certain conditions or if you travel or work in places where you may be exposed to hepatitis B. Haemophilus influenzae type b (Hib) vaccine  You may need this if you have certain risk factors. Human papillomavirus (HPV) vaccine  If recommended by your health care provider, you may need three doses over 6 months. You may receive vaccines as individual doses or as more than one vaccine together in one shot (combination vaccines). Talk with your health care provider about the risks and benefits of combination vaccines. What tests do I need? Blood tests  Lipid and cholesterol levels. These may be checked every 5 years, or more frequently if you are over 47 years old.  Hepatitis C test.  Hepatitis B test. Screening  Lung cancer screening. You may have this screening every year starting at age 56 if you have a 30-pack-year history of smoking and currently smoke or have quit within the past 15 years.  Prostate cancer screening. Recommendations will vary depending on your family history and other risks.  Colorectal cancer screening. All adults should have this screening starting at age 35 and continuing until age 45. Your health care provider may recommend screening at age 30 if you are at increased risk. You will have tests every 1-10 years, depending on your results and the type of screening test.  Diabetes screening. This is done by checking your blood sugar (glucose) after you have not eaten  for a while (fasting). You may have this done every 1-3 years.  Sexually transmitted disease (STD) testing. Follow these instructions at home: Eating and drinking  Eat a diet that includes fresh fruits and vegetables, whole grains, lean protein, and low-fat dairy products.  Take vitamin and mineral supplements as  recommended by your health care provider.  Do not drink alcohol if your health care provider tells you not to drink.  If you drink alcohol: ? Limit how much you have to 0-2 drinks a day. ? Be aware of how much alcohol is in your drink. In the [place] one drink equals one 12 oz bottle of beer (355 mL), one 5 oz glass of wine (148 mL), or one 1 oz glass of hard liquor (44 mL). Lifestyle  Take daily care of your teeth and gums.  Stay active. Exercise for at least 30 minutes on 5 or more days each week.  Do not use any products that contain nicotine or tobacco, such as cigarettes, e-cigarettes, and chewing tobacco. If you need help quitting, ask your health care provider.  If you are sexually active, practice safe sex. Use a condom or other form of protection to prevent STIs (sexually transmitted infections).  Talk with your health care provider about taking a low-dose aspirin every day starting at age 30. What's next?  Go to your health care provider once a year for a well check visit.  Ask your health care provider how often you should have your eyes and teeth checked.  Stay up to date on all vaccines. This information is not intended to replace advice given to you by your health care provider. Make sure you discuss any questions you have with your health care provider. Document Revised: 01/14/2018 Document Reviewed: 01/14/2018 Elsevier Patient Education  [place].  Inguinal Hernia, Adult An inguinal hernia develops when fat or the intestines push through a weak spot in a muscle where your leg meets your lower abdomen (groin). This creates a bulge. This kind of hernia could also be:  In your scrotum, if you are male.  In folds of skin around your vagina, if you are male. There are three types of inguinal hernias:  Hernias that can be pushed back into the abdomen (are reducible). This type rarely causes pain.  Hernias that are not reducible (are  incarcerated).  Hernias that are not reducible and lose their blood supply (are strangulated). This type of hernia requires emergency surgery. What are the causes? This condition is caused by having a weak spot in the muscles or tissues in the groin. This weak spot develops over time. The hernia may poke through the weak spot when you suddenly strain your lower abdominal muscles, such as when you:  Lift a heavy object.  Strain to have a bowel movement. Constipation can lead to straining.  Cough. What increases the risk? This condition is more likely to develop in:  Men.  Pregnant women.  People who: ? Are overweight. ? Work in jobs that require long periods of standing or heavy lifting. ? Have had an inguinal hernia before. ? Smoke or have lung disease. These factors can lead to long-lasting (chronic) coughing. What are the signs or symptoms? Symptoms may depend on the size of the hernia. Often, a small inguinal hernia has no symptoms. Symptoms of a larger hernia may include:  A lump in the groin area. This is easier to see when standing. It might not be visible when lying down.  Pain  or burning in the groin. This may get worse when lifting, straining, or coughing. °· A dull ache or a feeling of pressure in the groin. °· In men, an unusual lump in the scrotum. °Symptoms of a strangulated inguinal hernia may include: °· A bulge in your groin that is very painful and tender to the touch. °· A bulge that turns red or purple. °· Fever, nausea, and vomiting. °· Inability to have a bowel movement or to pass gas. °How is this diagnosed? °This condition is diagnosed based on your symptoms, your medical history, and a physical exam. Your health care provider may feel your groin area and ask you to cough. °How is this treated? °Treatment depends on the size of your hernia and whether you have symptoms. If you do not have symptoms, your health care provider may have you watch your hernia carefully  and have you come in for follow-up visits. If your hernia is large or if you have symptoms, you may need surgery to repair the hernia. °Follow these instructions at home: °Lifestyle °· Avoid lifting heavy objects. °· Avoid standing for long periods of time. °· Do not use any products that contain nicotine or tobacco, such as cigarettes and e-cigarettes. If you need help quitting, ask your health care provider. °· Maintain a healthy weight. °Preventing constipation °· Take actions to prevent constipation. Constipation leads to straining with bowel movements, which can make a hernia worse or cause a hernia repair to break down. Your health care provider may recommend that you: °? Drink enough fluid to keep your urine pale yellow. °? Eat foods that are high in fiber, such as fresh fruits and vegetables, whole grains, and beans. °? Limit foods that are high in fat and processed sugars, such as fried or sweet foods. °? Take an over-the-counter or prescription medicine for constipation. °General instructions °· You may try to push the hernia back in place by very gently pressing on it while lying down. Do not try to force the bulge back in if it will not push in easily. °· Watch your hernia for any changes in shape, size, or color. Get help right away if you notice any changes. °· Take over-the-counter and prescription medicines only as told by your health care provider. °· Keep all follow-up visits as told by your health care provider. This is important. °Contact a health care provider if: °· You have a fever. °· You develop new symptoms. °· Your symptoms get worse. °Get help right away if: °· You have pain in your groin that suddenly gets worse. °· You have a bulge in your groin that: °? Suddenly gets bigger and does not get smaller. °? Becomes red or purple or painful to the touch. °· You are a man and you have a sudden pain in your scrotum, or the size of your scrotum suddenly changes. °· You cannot push the hernia  back in place by very gently pressing on it when you are lying down. Do not try to force the bulge back in if it will not push in easily. °· You have nausea or vomiting that does not go away. °· You have a fast heartbeat. °· You cannot have a bowel movement or pass gas. °These symptoms may represent a serious problem that is an emergency. Do not wait to see if the symptoms will go away. Get medical help right away. Call your local emergency services (911 in the [place] °Summary °· An inguinal hernia develops when fat   or the intestines push through a weak spot in a muscle where your leg meets your lower abdomen (groin).  This condition is caused by having a weak spot in muscles or tissue in your groin.  Symptoms may depend on the size of the hernia, and they may include pain or swelling in your groin. A small inguinal hernia often has no symptoms.  Treatment may not be needed if you do not have symptoms. If you have symptoms or a large hernia, you may need surgery to repair the hernia.  Avoid lifting heavy objects. Also avoid standing for long amounts of time. This information is not intended to replace advice given to you by your health care provider. Make sure you discuss any questions you have with your health care provider. Document Revised: 02/21/2017 Document Reviewed: 10/22/2016 Elsevier Patient Education  [place].

## 2019-07-28 NOTE — Progress Notes (Signed)
Name: Roberto Walker   MRN: [id number]    DOB: 1960-10-15   Date:07/28/2019       Progress Note  Subjective  Chief Complaint  Chief Complaint  Patient presents with  . Hypertension  . Sleep Apnea  . Hyperlipidemia  . Hyperglycemia    HPI  Patient presents for annual CPE and follow   USPSTF grade A and B recommendations:  Diet: lots of fruit and vegetables, low in carbohydrates, lean meat and not a lot of it. Drinks water  Exercise: walks daily   Depression: phq 9 is negative Depression screen [facility] 2/9 07/28/2019 01/25/2019 12/01/2018 07/28/2018 07/28/2017  Decreased Interest 0 0 0 0 0  Down, Depressed, Hopeless 0 0 0 0 0  PHQ - 2 Score 0 0 0 0 0  Altered sleeping 0 0 0 0 -  Tired, decreased energy 0 0 0 0 -  Change in appetite 0 0 0 0 -  Feeling bad or failure about yourself  0 0 0 0 -  Trouble concentrating 0 0 0 0 -  Moving slowly or fidgety/restless 0 0 0 0 -  Suicidal thoughts 0 0 0 0 -  PHQ-9 Score 0 0 0 0 -  Difficult doing work/chores - - Not difficult at all - -    Hypertension:  BP Readings from Last 3 Encounters:  07/28/19 122/90  01/25/19 124/88  12/13/18 124/78    Obesity: Wt Readings from Last 3 Encounters:  07/28/19 203 lb 11.2 oz (92.4 kg)  01/25/19 201 lb 12.8 oz (91.5 kg)  12/13/18 200 lb (90.7 kg)   BMI Readings from Last 3 Encounters:  07/28/19 28.41 kg/m  01/25/19 28.15 kg/m  12/13/18 27.89 kg/m     Lipids:  Lab Results  Component Value Date   CHOL 124 07/28/2018   CHOL 126 07/28/2017   CHOL 121 06/06/2016   Lab Results  Component Value Date   HDL 61 07/28/2018   HDL 55 07/28/2017   HDL 47 06/06/2016   Lab Results  Component Value Date   LDLCALC 49 07/28/2018   LDLCALC 58 07/28/2017   LDLCALC 65 06/06/2016   Lab Results  Component Value Date   TRIG 59 07/28/2018   TRIG 57 07/28/2017   TRIG 47 06/06/2016   Lab Results  Component Value Date   CHOLHDL 2.0 07/28/2018   CHOLHDL 2.3 07/28/2017   CHOLHDL 2.6 06/06/2016    No results found for: LDLDIRECT Glucose:  Glucose  Date Value Ref Range Status  05/17/2013 150 (H) 65 - 99 mg/dL Final  05/16/2013 111 (H) 65 - 99 mg/dL Final  03/08/2013 127 (H) 65 - 99 mg/dL Final   Glucose, Bld  Date Value Ref Range Status  07/28/2018 110 (H) 65 - 99 mg/dL Final    Comment:    .            Fasting reference interval . For someone without known diabetes, a glucose value between 100 and 125 mg/dL is consistent with prediabetes and should be confirmed with a follow-up test. .   07/28/2017 110 65 - 139 mg/dL Final    Comment:    .        Non-fasting reference interval .   06/06/2016 96 65 - 99 mg/dL Final      Office Visit from 12/01/2018 in [facility]  AUDIT-C Score 0      Married STD testing and prevention (HIV/chl/gon/syphilis): N/A Hep C: 07/2012 normal  Skin cancer: Discussed monitoring  for atypical lesions Colorectal cancer: he is due for repeat colonoscopy, but afraid of finding cancer, discussed importance of having it done  Prostate cancer: s/p radiation therapy , seen by Dr. Ali Lowe back in June 2020 but cancelled follow up appointment he states he would like to just have PSA here once a year and go back if any problems.   Lab Results  Component Value Date   PSA 0.1 07/28/2018   PSA 0.72 04/15/2017   PSA 3.7 09/22/2013    IPSS Questionnaire (AUA-7): Over the past month.   1)  How often have you had a sensation of not emptying your bladder completely after you finish urinating?  0 - Not at all  2)  How often have you had to urinate again less than two hours after you finished urinating? 0 - Not at all  3)  How often have you found you stopped and started again several times when you urinated?  0 - Not at all  4) How difficult have you found it to postpone urination?  0 - Not at all  5) How often have you had a weak urinary stream?  0 - Not at all  6) How often have you had to push or strain to begin urination?  0  - Not at all  7) How many times did you most typically get up to urinate from the time you went to bed until the time you got up in the morning?  1 - 1 time  Total score:  0-7 mildly symptomatic   8-19 moderately symptomatic   20-35 severely symptomatic    Lung cancer:   Low Dose CT Chest recommended if Age 60-80 years, 30 pack-year currently smoking OR have quit w/in 15years. Patient does not qualify.   AAA: The USPSTF recommends one-time screening with ultrasonography in men ages 60 to 14 years who have ever smoked ECG:  Sees cardiologist yearly   Advanced Care Planning: A voluntary discussion about advance care planning including the explanation and discussion of advance directives.  Discussed health care proxy and Living will, and the patient was able to identify a health care proxy as wife .  Patient does have a living will at present time. If patient does have living will, I have requested they bring this to the clinic to be scanned in to their chart.  Patient Active Problem List   Diagnosis Date Noted  . Personal history of kidney cancer 02/06/2015  . Benign essential HTN 08/02/2014  . Arteriosclerosis of coronary artery 08/02/2014  . Dyslipidemia 08/02/2014  . H/O acute myocardial infarction 08/02/2014  . 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/02/2014  . Dysmetabolic syndrome 19/41/7408  . Arthritis, degenerative 08/02/2014  . Peripheral vascular disease ([place]) 08/02/2014  . Blood glucose elevated 08/02/2014  . Obstructive sleep apnea on CPAP 08/02/2014  . Vitamin D deficiency 08/02/2014  . Vitiligo 08/02/2014    Past Surgical History:  Procedure Laterality Date  . Beam Radiation     Prostate using Photons  . ILIAC VEIN ANGIOPLASTY / STENTING    . LEFT HEART CATH  01/11/2012  . NEPHRECTOMY Right 2010   Partial    Family History  Problem Relation Age of Onset  . Hypertension Mother   . Lung cancer Father   . Hypertension Father   .  Hyperlipidemia Father   . Diabetes Sister        oldest sister  . Hypertension Sister   . Hypertension Sister  Social History   Socioeconomic History  . Marital status: Married    Spouse name: Mateo Flow  . Number of children: 2  . Years of education: College   . Highest education level: Not on file  Occupational History  . 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 Smoking status: Former Smoker    Packs/day: 1.00    Years: 20.00    Pack years: 20.00    Types: Cigarettes    Quit date: 01/12/2012    Years since quitting: 7.5  . Smokeless tobacco: Never Used  Vaping Use  . Vaping Use: Never used  Substance and Sexual Activity  . Alcohol use: Yes    Alcohol/week: 0.0 standard drinks    Comment: Minimal alcohol consumption  . Drug use: No  . Sexual activity: Yes    Partners: Female  Other Topics Concern  . Not on file  Social History Narrative  . Not on file   Social Determinants of Health   Financial Resource Strain:   . Difficulty of Paying Living Expenses:   Food Insecurity:   . Worried About Charity fundraiser in the Last Year:   . Arboriculturist in the Last Year:   Transportation Needs:   . Film/video editor (Medical):   Marland Kitchen Lack of Transportation (Non-Medical):   Physical Activity: Sufficiently Active  . Days of Exercise per Week: 5 days  . Minutes of Exercise per Session: 30 min  Stress: No Stress Concern Present  . Feeling of Stress : Not at all  Social Connections:   . Frequency of Communication with Friends and Family:   . Frequency of Social Gatherings with Friends and Family:   . Attends Religious Services:   . Active Member of Clubs or Organizations:   . Attends Archivist Meetings:   Marland Kitchen Marital Status:   Intimate Partner Violence:   . Fear of Current or Ex-Partner:   . Emotionally Abused:   Marland Kitchen Physically Abused:   . Sexually Abused:      Current Outpatient Medications:  .  amLODipine-olmesartan (AZOR) 10-40 MG tablet, Take 1 tablet by  mouth daily., Disp: 90 tablet, Rfl: 1 .  aspirin 81 MG tablet, Take 1 tablet by mouth daily., Disp: , Rfl:  .  atorvastatin (LIPITOR) 80 MG tablet, Take 1 tablet (80 mg total) by mouth daily., Disp: 90 tablet, Rfl: 1 .  carvedilol (COREG) 12.5 MG tablet, Take 1 tablet (12.5 mg total) by mouth 2 (two) times daily., Disp: 180 tablet, Rfl: 1 .  cholecalciferol (VITAMIN D) 1000 UNITS tablet, Take 1 tablet by mouth daily., Disp: , Rfl:  .  clopidogrel (PLAVIX) 75 MG tablet, Take 1 tablet (75 mg total) by mouth daily., Disp: 90 tablet, Rfl: 1 .  nitroGLYCERIN (NITROSTAT) 0.4 MG SL tablet, Place 1 tablet (0.4 mg total) under the tongue daily., Disp: 30 tablet, Rfl: 0 .  tadalafil (CIALIS) 20 MG tablet, Take 0.5-1 tablets (10-20 mg total) by mouth every other day as needed for erectile dysfunction., Disp: 30 tablet, Rfl: 2  Allergies  Allergen Reactions  . Lisinopril Swelling    Swelling around mouth, lips     ROS  Constitutional: Negative for fever or weight change.  Respiratory: Negative for cough and shortness of breath.   Cardiovascular: Negative for chest pain or palpitations.  Gastrointestinal: Negative for abdominal pain, no bowel changes.  Musculoskeletal: Negative for gait problem or joint swelling.  Skin: Negative for rash.   Neurological: Negative for dizziness or  headache.  No other specific complaints in a complete review of systems (except as listed in HPI above).   Objective  Vitals:   07/28/19 0810  BP: 122/90  Pulse: (!) 58  Resp: 16  Temp: (!) 97.1 F (36.2 C)  TempSrc: Temporal  SpO2: 97%  Weight: 203 lb 11.2 oz (92.4 kg)  Height: 5\' 11"  (1.803 m)    Body mass index is 28.41 kg/m.  Physical Exam  Constitutional: Patient appears well-developed and well-nourished. No distress.  HENT: Head: Normocephalic and atraumatic. Ears: B TMs ok, no erythema or effusion; Nose: Nose normal. Mouth/Throat: Oropharynx is clear and moist. No oropharyngeal exudate.  Eyes:  Conjunctivae and EOM are normal. Pupils are equal, round, and reactive to light. No scleral icterus.  Neck: Normal range of motion. Neck supple. No JVD present. No thyromegaly present.  Cardiovascular: Normal rate, regular rhythm and normal heart sounds.  No murmur heard. No BLE edema. Pulmonary/Chest: Effort normal and breath sounds normal. No respiratory distress. Abdominal: Soft. Bowel sounds are normal, no distension. There is no tenderness. no masses, negative CVA tenderness  MALE GENITALIA: Normal descended testes bilaterally, no masses palpated, no circumcised, vitiligo on penis, no discharge, large left inguinal hernia RECTAL: not done  Musculoskeletal: Normal range of motion, no joint effusions. No gross deformities Neurological: he is alert and oriented to person, place, and time. No cranial nerve deficit. Coordination, balance, strength, speech and gait are normal.  Skin: Skin is warm and dry. Vitiligo on both hands  No erythema.  Psychiatric: Patient has a normal mood and affect. behavior is normal. Judgment and thought content normal.  PHQ2/9: Depression screen [facility] 2/9 07/28/2019 01/25/2019 12/01/2018 07/28/2018 07/28/2017  Decreased Interest 0 0 0 0 0  Down, Depressed, Hopeless 0 0 0 0 0  PHQ - 2 Score 0 0 0 0 0  Altered sleeping 0 0 0 0 -  Tired, decreased energy 0 0 0 0 -  Change in appetite 0 0 0 0 -  Feeling bad or failure about yourself  0 0 0 0 -  Trouble concentrating 0 0 0 0 -  Moving slowly or fidgety/restless 0 0 0 0 -  Suicidal thoughts 0 0 0 0 -  PHQ-9 Score 0 0 0 0 -  Difficult doing work/chores - - Not difficult at all - -     Fall Risk: Fall Risk  07/28/2019 01/25/2019 12/01/2018 07/28/2018 07/28/2018  Falls in the past year? 0 0 0 0 0  Number falls in past yr: 0 0 0 0 0  Injury with Fall? 0 0 0 0 0     Assessment & Plan  1. Well adult exam   2. Colon cancer screening  - Ambulatory referral to Gastroenterology  3. Personal history of kidney  cancer  - Urinalysis, Complete  4. Personal history of prostate cancer  - PSA  5. Vitamin D deficiency  - VITAMIN D 25 Hydroxy (Vit-D Deficiency, Fractures)  6. Dyslipidemia  - Lipid panel  7. Benign essential HTN  - COMPLETE METABOLIC PANEL WITH GFR - CBC with Differential/Platelet  8. Screening for diabetes mellitus  - Hemoglobin A1c  9. Left inguinal hernia  Discussed when to go to [facility], not interested on surgery at this time  -Prostate cancer screening and PSA options (with potential risks and benefits of testing vs not testing) were discussed along with recent recs/guidelines. -USPSTF grade A and B recommendations reviewed with patient; age-appropriate recommendations, preventive care, screening tests, etc discussed and encouraged;  healthy living encouraged; see AVS for patient education given to patient -Discussed importance of 150 minutes of physical activity weekly, eat two servings of fish weekly, eat one serving of tree nuts ( cashews, pistachios, pecans, almonds.Marland Kitchen) every other day, eat 6 servings of fruit/vegetables daily and drink plenty of water and avoid sweet beverages.

## 2019-07-29 LAB — URINALYSIS, COMPLETE
Bacteria, UA: NONE SEEN /HPF
Bilirubin Urine: NEGATIVE
Glucose, UA: NEGATIVE
Hyaline Cast: NONE SEEN /LPF
Ketones, ur: NEGATIVE
Leukocytes,Ua: NEGATIVE
Nitrite: NEGATIVE
Protein, ur: NEGATIVE
Specific Gravity, Urine: 1.02 (ref 1.001–1.03)
Squamous Epithelial / HPF: NONE SEEN /HPF (ref <=5)
WBC, UA: NONE SEEN /HPF (ref 0–5)
pH: 5.5 (ref 5.0–8.0)

## 2019-07-29 LAB — HEMOGLOBIN A1C
Hgb A1c MFr Bld: 5.6 % of total Hgb (ref <5.7)
Mean Plasma Glucose: 114 (calc)
eAG (mmol/L): 6.3 (calc)

## 2019-07-29 LAB — COMPLETE METABOLIC PANEL WITH GFR
AG Ratio: 1.6 (calc) (ref 1.0–2.5)
ALT: 33 U/L (ref 9–46)
AST: 24 U/L (ref 10–35)
Albumin: 4.3 g/dL (ref 3.6–5.1)
Alkaline phosphatase (APISO): 56 U/L (ref 35–144)
BUN: 17 mg/dL (ref 7–25)
CO2: 25 mmol/L (ref 20–32)
Calcium: 10.6 mg/dL — ABNORMAL HIGH (ref 8.6–10.3)
Chloride: 109 mmol/L (ref 98–110)
Creat: 1.07 mg/dL (ref 0.70–1.33)
GFR, Est African American: 88 mL/min/{1.73_m2} (ref >=60)
GFR, Est Non African American: 76 mL/min/{1.73_m2} (ref >=60)
Globulin: 2.7 g/dL (calc) (ref 1.9–3.7)
Glucose, Bld: 103 mg/dL — ABNORMAL HIGH (ref 65–99)
Potassium: 4.4 mmol/L (ref 3.5–5.3)
Sodium: 138 mmol/L (ref 135–146)
Total Bilirubin: 0.5 mg/dL (ref 0.2–1.2)
Total Protein: 7 g/dL (ref 6.1–8.1)

## 2019-07-29 LAB — CBC WITH DIFFERENTIAL/PLATELET
Absolute Monocytes: 544 cells/uL (ref 200–950)
Basophils Absolute: 27 cells/uL (ref 0–200)
Basophils Relative: 0.4 %
Eosinophils Absolute: 231 cells/uL (ref 15–500)
Eosinophils Relative: 3.4 %
HCT: 39.5 % (ref 38.5–50.0)
Hemoglobin: 13.5 g/dL (ref 13.2–17.1)
Lymphs Abs: 2591 cells/uL (ref 850–3900)
MCH: 33 pg (ref 27.0–33.0)
MCHC: 34.2 g/dL (ref 32.0–36.0)
MCV: 96.6 fL (ref 80.0–100.0)
MPV: 9.1 fL (ref 7.5–12.5)
Monocytes Relative: 8 %
Neutro Abs: 3407 cells/uL (ref 1500–7800)
Neutrophils Relative %: 50.1 %
Platelets: 263 10*3/uL (ref 140–400)
RBC: 4.09 10*6/uL — ABNORMAL LOW (ref 4.20–5.80)
RDW: 13.1 % (ref 11.0–15.0)
Total Lymphocyte: 38.1 %
WBC: 6.8 10*3/uL (ref 3.8–10.8)

## 2019-07-29 LAB — LIPID PANEL
Cholesterol: 143 mg/dL (ref <200)
HDL: 59 mg/dL (ref >=40)
LDL Cholesterol (Calc): 71 mg/dL (calc)
Non-HDL Cholesterol (Calc): 84 mg/dL (calc) (ref <130)
Total CHOL/HDL Ratio: 2.4 (calc) (ref <5.0)
Triglycerides: 57 mg/dL (ref <150)

## 2019-07-29 LAB — VITAMIN D 25 HYDROXY (VIT D DEFICIENCY, FRACTURES): Vit D, 25-Hydroxy: 42 ng/mL (ref 30–100)

## 2019-07-29 LAB — PSA: PSA: <0.1 ng/mL (ref <=4.0)

## 2019-07-29 MED ORDER — AMLODIPINE-OLMESARTAN 10-40 MG PO TABS: 1.0000 | ORAL_TABLET | Freq: Every day | ORAL | 1 refills | Status: DC

## 2019-08-15 ENCOUNTER — Encounter: Payer: Self-pay | Admitting: Family Medicine

## 2019-08-15 ENCOUNTER — Other Ambulatory Visit: Payer: Self-pay | Admitting: Family Medicine

## 2019-08-15 DIAGNOSIS — Z85528 Personal history of other malignant neoplasm of kidney: Secondary | ICD-10-CM

## 2019-08-15 DIAGNOSIS — R319 Hematuria, unspecified: Secondary | ICD-10-CM

## 2019-08-24 DIAGNOSIS — H2513 Age-related nuclear cataract, bilateral: Secondary | ICD-10-CM | POA: Diagnosis not present

## 2019-08-26 DIAGNOSIS — Z85528 Personal history of other malignant neoplasm of kidney: Secondary | ICD-10-CM | POA: Diagnosis not present

## 2019-08-26 DIAGNOSIS — R319 Hematuria, unspecified: Secondary | ICD-10-CM | POA: Diagnosis not present

## 2019-08-27 LAB — URINALYSIS, COMPLETE
Bacteria, UA: NONE SEEN /HPF
Bilirubin Urine: NEGATIVE
Glucose, UA: NEGATIVE
Hyaline Cast: NONE SEEN /LPF
Ketones, ur: NEGATIVE
Leukocytes,Ua: NEGATIVE
Nitrite: NEGATIVE
Protein, ur: NEGATIVE
Specific Gravity, Urine: 1.022 (ref 1.001–1.03)
Squamous Epithelial / HPF: NONE SEEN /HPF (ref <=5)
WBC, UA: NONE SEEN /HPF (ref 0–5)
pH: <=5 (ref 5.0–8.0)

## 2019-08-29 ENCOUNTER — Encounter: Payer: Self-pay | Admitting: Family Medicine

## 2019-08-29 ENCOUNTER — Other Ambulatory Visit: Payer: Self-pay | Admitting: Family Medicine

## 2019-08-29 DIAGNOSIS — R319 Hematuria, unspecified: Secondary | ICD-10-CM

## 2019-08-29 DIAGNOSIS — Z85528 Personal history of other malignant neoplasm of kidney: Secondary | ICD-10-CM

## 2019-08-29 DIAGNOSIS — Z8546 Personal history of malignant neoplasm of prostate: Secondary | ICD-10-CM

## 2019-09-06 NOTE — Progress Notes (Addendum)
09/07/2019 9:10 AM   Roberto Walker Sep 12, 1960 [id number]  Referring provider: Steele Sizer, MD [address] [place],  [place] [address] Chief Complaint  Patient presents with  . Hematuria    HPI: Roberto Walker is a 59 y.o. male with a personal history of kidney and prostate cancer and hematuria who presents today for evaluation.  He was previously followed at [facility] and is seeking to transfer care here for all of his GU issues.  Patient last saw his PCP on 07/28/2019 for a routine follow up. UA showed 1+ blood otherwise negative. Repeat UA on 08/26/2019 showed 2+ blood otherwise negative. There were no associated cultures.  UA dipsticks have revealed 1+ or 2+ RBC but microscopic UA's revealed no RBC's.   Patient her a personal history of kidney and prostate cancer. Patient was diagnosed with prostate cancer in 2015. He underwent cyberknife treatment. He has no evidence of disease. Gleason score 3+4 right lateral mid prostate and 3+3 right lateral base.Most recent PSA <0.1 as of 07/28/19.   Personal historyT1a right papillary RCC type 1 and a partial nephrectomy in 2010 by Dr. Lottie Rater.  No recurrence.  Patient has a personal history of severe ED that is well controlled with Cialis.   Today the patient is doing well. He has no complaints. Denies hematuria. He is voiding well.   Patient is a smoker. He smokes occasionally now, he used to smoke 1 ppd.  PSA trend: 09/04/10: 2.6 06/13/13: 3.7 09/22/13: 3.7 12/22/13: 4.8 03/23/14: 6.8 06/19/14: 6.98 03/12/15: 6.47 09/27/15: 4.20 03/31/26: 2.02 09/29/16: 0.81 04/16/17: 0.72 10/15/17: 0.28 07/28/18: 0.1 07/28/19: <0.1  PMH: Past Medical History:  Diagnosis Date  . Hypercalcemia   . Hyperlipidemia   . Hypertension   . Metabolic syndrome   . Microscopic hematuria   . Osteoarthrosis   . Peripheral vascular disease ([place])   . Prostate cancer ([place])   . Renal cell carcinoma ([place])   . Vitamin D deficiency   . Vitiligo      Surgical History: Past Surgical History:  Procedure Laterality Date  . Beam Radiation     Prostate using Photons  . ILIAC VEIN ANGIOPLASTY / STENTING    . LEFT HEART CATH  01/11/2012  . NEPHRECTOMY Right 2010   Partial    Home Medications:  Allergies as of 09/07/2019      Reactions   Lisinopril Swelling   Swelling around mouth, lips      Medication List       Accurate as of September 07, 2019  9:10 AM. If you have any questions, ask your nurse or doctor.        amLODipine-olmesartan 10-40 MG tablet Commonly known as: Azor Take 1 tablet by mouth daily.   aspirin 81 MG tablet Take 1 tablet by mouth daily.   atorvastatin 80 MG tablet Commonly known as: LIPITOR Take 1 tablet (80 mg total) by mouth daily.   carvedilol 12.5 MG tablet Commonly known as: COREG Take 1 tablet (12.5 mg total) by mouth 2 (two) times daily.   cholecalciferol 1000 units tablet Commonly known as: VITAMIN D Take 1 tablet by mouth daily.   clopidogrel 75 MG tablet Commonly known as: PLAVIX Take 1 tablet (75 mg total) by mouth daily.   nitroGLYCERIN 0.4 MG SL tablet Commonly known as: Nitrostat Place 1 tablet (0.4 mg total) under the tongue daily.   tadalafil 20 MG tablet Commonly known as: CIALIS Take 0.5-1 tablets (10-20 mg total) by mouth every  other day as needed for erectile dysfunction.       Allergies:  Allergies  Allergen Reactions  . Lisinopril Swelling    Swelling around mouth, lips    Family History: Family History  Problem Relation Age of Onset  . Hypertension Mother   . Lung cancer Father   . Hypertension Father   . Hyperlipidemia Father   . Diabetes Sister        oldest sister  . Hypertension Sister   . Hypertension Sister     Social History:  reports that he quit smoking about 7 years ago. His smoking use included cigarettes. He has a 10.00 pack-year smoking history. He has never used smokeless tobacco. He reports current alcohol use of about 2.0 standard  drinks of alcohol per week. He reports that he does not use drugs.   Physical Exam: BP (!) 149/97   Pulse (!) 59   Ht 6' (1.829 m)   Wt 203 lb (92.1 kg)   BMI 27.53 kg/m   Constitutional:  Alert and oriented, No acute distress. HEENT: [place] AT, moist mucus membranes.  Trachea midline, no masses. Cardiovascular: No clubbing, cyanosis, or edema. Respiratory: Normal respiratory effort, no increased work of breathing. Skin: No rashes, bruises or suspicious lesions. Neurologic: Grossly intact, no focal deficits, moving all 4 extremities. Psychiatric: Normal mood and affect.  Laboratory Data:  Lab Results  Component Value Date   CREATININE 1.07 07/28/2019    Lab Results  Component Value Date   PSA <0.1 07/28/2019   PSA 0.1 07/28/2018   PSA 0.72 04/15/2017    No results found for: TESTOSTERONE  Lab Results  Component Value Date   HGBA1C 5.6 07/28/2019    Urinalysis 3-10 RBC otherwise negative   Assessment & Plan:    1. Microscopic hematuria UA shows 3-10 RBC otherwise negative Due to his smoking history and age I have recommended a CT urogram and cystoscopy. Patient agreed.   2. History of prostate cancer No evidence of disease  Continue annual PSA  3. History of RCC type 1 No further surveillance is needed at this time, however will evaluate with CT scan above.   4. ED Symptoms controlled on Cialis Warning not to use Cialis in combination with nitroglycerin  Patient would like to continue his GU care with our clinic.  He will be retiring from the [facility] is more convenient.    [facility] [address], [place], [place] [address] [phone]  I, Selena Batten, am acting as a scribe for Dr. Hollice Espy.  I have reviewed the above documentation for accuracy and completeness, and I agree with the above.   Hollice Espy, MD   I spent 45 total minutes on the day of the encounter including pre-visit  review of the medical record, face-to-face time with the patient, and post visit ordering of labs/imaging/tests.

## 2019-09-07 ENCOUNTER — Ambulatory Visit (INDEPENDENT_AMBULATORY_CARE_PROVIDER_SITE_OTHER): Payer: Federal, State, Local not specified - PPO | Admitting: Urology

## 2019-09-07 ENCOUNTER — Other Ambulatory Visit: Payer: Self-pay

## 2019-09-07 ENCOUNTER — Encounter: Payer: Self-pay | Admitting: Urology

## 2019-09-07 VITALS — BP 149/97 | HR 59 | Ht 72.0 in | Wt 203.0 lb

## 2019-09-07 DIAGNOSIS — R319 Hematuria, unspecified: Secondary | ICD-10-CM

## 2019-09-07 NOTE — Patient Instructions (Signed)
Cystoscopy Cystoscopy is a procedure that is used to help diagnose and sometimes treat conditions that affect the lower urinary tract. The lower urinary tract includes the bladder and the urethra. The urethra is the tube that drains urine from the bladder. Cystoscopy is done using a thin, tube-shaped instrument with a light and camera at the end (cystoscope). The cystoscope may be hard or flexible, depending on the goal of the procedure. The cystoscope is inserted through the urethra, into the bladder. Cystoscopy may be recommended if you have:  Urinary tract infections that keep coming back.  Blood in the urine (hematuria).  An inability to control when you urinate (urinary incontinence) or an overactive bladder.  Unusual cells found in a urine sample.  A blockage in the urethra, such as a urinary stone.  Painful urination.  An abnormality in the bladder found during an intravenous pyelogram (IVP) or CT scan. Cystoscopy may also be done to remove a sample of tissue to be examined under a microscope (biopsy). What are the risks? Generally, this is a safe procedure. However, problems may occur, including:  Infection.  Bleeding.  What happens during the procedure?  1. You will be given one or more of the following: ? A medicine to numb the area (local anesthetic). 2. The area around the opening of your urethra will be cleaned. 3. The cystoscope will be passed through your urethra into your bladder. 4. Germ-free (sterile) fluid will flow through the cystoscope to fill your bladder. The fluid will stretch your bladder so that your health care provider can clearly examine your bladder walls. 5. Your doctor will look at the urethra and bladder. 6. The cystoscope will be removed The procedure may vary among health care providers  What can I expect after the procedure? After the procedure, it is common to have: 1. Some soreness or pain in your abdomen and urethra. 2. Urinary symptoms.  These include: ? Mild pain or burning when you urinate. Pain should stop within a few minutes after you urinate. This may last for up to 1 week. ? A small amount of blood in your urine for several days. ? Feeling like you need to urinate but producing only a small amount of urine. Follow these instructions at home: General instructions  Return to your normal activities as told by your health care provider.   Do not drive for 24 hours if you were given a sedative during your procedure.  Watch for any blood in your urine. If the amount of blood in your urine increases, call your health care provider.  If a tissue sample was removed for testing (biopsy) during your procedure, it is up to you to get your test results. Ask your health care provider, or the department that is doing the test, when your results will be ready.  Drink enough fluid to keep your urine pale yellow.  Keep all follow-up visits as told by your health care provider. This is important. Contact a health care provider if you:  Have pain that gets worse or does not get better with medicine, especially pain when you urinate.  Have trouble urinating.  Have more blood in your urine. Get help right away if you:  Have blood clots in your urine.  Have abdominal pain.  Have a fever or chills.  Are unable to urinate. Summary  Cystoscopy is a procedure that is used to help diagnose and sometimes treat conditions that affect the lower urinary tract.  Cystoscopy is done using   a thin, tube-shaped instrument with a light and camera at the end.  After the procedure, it is common to have some soreness or pain in your abdomen and urethra.  Watch for any blood in your urine. If the amount of blood in your urine increases, call your health care provider.  If you were prescribed an antibiotic medicine, take it as told by your health care provider. Do not stop taking the antibiotic even if you start to feel better. This  information is not intended to replace advice given to you by your health care provider. Make sure you discuss any questions you have with your health care provider. Document Revised: 01/12/2018 Document Reviewed: 01/12/2018 Elsevier Patient Education  2020 [employer]   

## 2019-09-09 LAB — URINALYSIS, COMPLETE
Bilirubin, UA: NEGATIVE
Glucose, UA: NEGATIVE
Ketones, UA: NEGATIVE
Leukocytes,UA: NEGATIVE
Nitrite, UA: NEGATIVE
Protein,UA: NEGATIVE
Specific Gravity, UA: 1.025 (ref 1.005–1.030)
Urobilinogen, Ur: 0.2 mg/dL (ref 0.2–1.0)
pH, UA: 5.5 (ref 5.0–7.5)

## 2019-09-09 LAB — MICROSCOPIC EXAMINATION: Bacteria, UA: NONE SEEN

## 2019-09-23 DIAGNOSIS — I2510 Atherosclerotic heart disease of native coronary artery without angina pectoris: Secondary | ICD-10-CM | POA: Diagnosis not present

## 2019-09-23 DIAGNOSIS — I10 Essential (primary) hypertension: Secondary | ICD-10-CM | POA: Diagnosis not present

## 2019-09-26 ENCOUNTER — Other Ambulatory Visit: Payer: Self-pay

## 2019-09-26 ENCOUNTER — Ambulatory Visit
Admission: RE | Admit: 2019-09-26 | Discharge: 2019-09-26 | Disposition: A | Discharge status: Home / Self Care | Payer: Federal, State, Local not specified - PPO | Source: Ambulatory Visit | Attending: Urology | Admitting: Urology

## 2019-09-26 DIAGNOSIS — I723 Aneurysm of iliac artery: Secondary | ICD-10-CM | POA: Diagnosis not present

## 2019-09-26 DIAGNOSIS — K4090 Unilateral inguinal hernia, without obstruction or gangrene, not specified as recurrent: Secondary | ICD-10-CM | POA: Diagnosis not present

## 2019-09-26 DIAGNOSIS — I700 Atherosclerosis of aorta: Secondary | ICD-10-CM | POA: Diagnosis not present

## 2019-09-26 DIAGNOSIS — R319 Hematuria, unspecified: Secondary | ICD-10-CM | POA: Insufficient documentation

## 2019-09-26 DIAGNOSIS — N281 Cyst of kidney, acquired: Secondary | ICD-10-CM | POA: Diagnosis not present

## 2019-09-26 LAB — POCT I-STAT CREATININE: Creatinine, Ser: 1.1 mg/dL (ref 0.61–1.24)

## 2019-09-26 MED ORDER — IOHEXOL 300 MG/ML  SOLN
125.0000 mL | Freq: Once | INTRAMUSCULAR | Status: AC | PRN
  Administered 2019-09-26: 125 mL via INTRAVENOUS

## 2019-10-04 ENCOUNTER — Ambulatory Visit: Payer: Federal, State, Local not specified - PPO | Admitting: Family Medicine

## 2019-10-05 ENCOUNTER — Other Ambulatory Visit: Payer: Self-pay | Admitting: Urology

## 2019-10-05 ENCOUNTER — Telehealth: Payer: Self-pay

## 2019-10-05 NOTE — Telephone Encounter (Signed)
-----   Message from Hollice Espy, MD sent at 10/05/2019  8:40 AM EDT ----- Looks like this patient cancelled his f/u today for cystoscopy.  Please call and reschedule him.    Hollice Espy, MD

## 2019-10-05 NOTE — Telephone Encounter (Signed)
Patient canceled appointment for cystoscopy. I called patient to reschedule he declined rescheduling. He states he does not feel he needs the cystoscopy and would not like to schedule it at this time.

## 2019-10-14 NOTE — Telephone Encounter (Signed)
Called pt. To reschedule missed appointment from 10/05/19. No answer and left message for pt to call office and reschedule appointment.

## 2019-10-19 DIAGNOSIS — H527 Unspecified disorder of refraction: Secondary | ICD-10-CM | POA: Diagnosis not present

## 2019-11-01 NOTE — Progress Notes (Signed)
11/02/2019 3:57 PM   Roberto Walker [birth year] [id number]  Referring provider: Steele Sizer, MD [address] [place],  [place] [address] Chief Complaint  Patient presents with  . Results    CTscan    HPI: Roberto Walker is a 59 y.o. male who returns for a 1 month follow up and review of imaging.   Patient last saw his PCP on 07/28/2019 for a routine follow up. UA showed 1+ blood otherwise negative. Repeat UA on 08/26/2019 showed 2+ blood otherwise negative. There were no associated cultures.  UA dipsticks have revealed 1+ or 2+ RBC but microscopic UA's revealed no RBC's.   Patient her a personal history of kidney and prostate cancer. Patient was diagnosed with prostate cancer in 2015. He underwent cyberknife treatment. He has no evidence of disease. Gleason score 3+4 right lateral mid prostate and 3+3 right lateral base.Most recent PSA <0.1 as of 07/28/19.   Personal historyT1a right papillary RCC type 1 and a partial nephrectomy in 2010 by Dr. Lottie Rater.  No recurrence.  Patient has a personal history of severe ED that is well controlled with Cialis.   CT hematuria workup on 09/26/2019 revealed no findings identified to explain patient's hematuria. No kidney stones identified bilaterally. Left kidney Bosniak category 1 and 2 cysts. Additional, bilateral kidney lesions are less than 1 cm and too small to reliably Characterize. 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 Fat containing left inguinal hernia.   Patient does not want to have a cystoscopy. He reports his last cysto 8 years ago was painful. Denies anxiety about procedure.  He has no complaints.   Patient is a smoker. He smokes occasionally now, he used to smoke 1 ppd.  PSA trend: 09/04/10: 2.6 06/13/13: 3.7 09/22/13: 3.7 12/22/13: 4.8 03/23/14: 6.8 06/19/14: 6.98 03/12/15: 6.47 09/27/15: 4.20 03/31/26: 2.02 09/29/16: 0.81 04/16/17: 0.72 10/15/17: 0.28 07/28/18: 0.1 07/28/19:  <0.1  PMH: Past Medical History:  Diagnosis Date  . Hypercalcemia   . Hyperlipidemia   . Hypertension   . Metabolic syndrome   . Microscopic hematuria   . Osteoarthrosis   . Peripheral vascular disease ([place])   . Prostate cancer ([place])   . Renal cell carcinoma ([place])   . Vitamin D deficiency   . Vitiligo     Surgical History: Past Surgical History:  Procedure Laterality Date  . Beam Radiation     Prostate using Photons  . ILIAC VEIN ANGIOPLASTY / STENTING    . LEFT HEART CATH  01/11/2012  . NEPHRECTOMY Right 2010   Partial    Home Medications:  Allergies as of 11/02/2019      Reactions   Lisinopril Swelling   Swelling around mouth, lips      Medication List       Accurate as of November 02, 2019 11:59 PM. If you have any questions, ask your nurse or doctor.        amLODipine-olmesartan 10-40 MG tablet Commonly known as: Azor Take 1 tablet by mouth daily.   aspirin 81 MG tablet Take 1 tablet by mouth daily.   atorvastatin 80 MG tablet Commonly known as: LIPITOR Take 1 tablet (80 mg total) by mouth daily.   carvedilol 12.5 MG tablet Commonly known as: COREG Take 1 tablet (12.5 mg total) by mouth 2 (two) times daily.   cholecalciferol 1000 units tablet Commonly known as: VITAMIN D Take 1 tablet by mouth daily.   clopidogrel 75 MG tablet Commonly known as:  PLAVIX Take 1 tablet (75 mg total) by mouth daily.   nitroGLYCERIN 0.4 MG SL tablet Commonly known as: Nitrostat Place 1 tablet (0.4 mg total) under the tongue daily.   tadalafil 20 MG tablet Commonly known as: CIALIS Take 0.5-1 tablets (10-20 mg total) by mouth every other day as needed for erectile dysfunction.       Allergies:  Allergies  Allergen Reactions  . Lisinopril Swelling    Swelling around mouth, lips    Family History: Family History  Problem Relation Age of Onset  . Hypertension Mother   . Lung cancer Father   . Hypertension Father   . Hyperlipidemia Father   .  Diabetes Sister        oldest sister  . Hypertension Sister   . Hypertension Sister     Social History:  reports that he quit smoking about 7 years ago. His smoking use included cigarettes. He has a 10.00 pack-year smoking history. He has never used smokeless tobacco. He reports current alcohol use of about 2.0 standard drinks of alcohol per week. He reports that he does not use drugs.   Physical Exam: BP 120/75   Pulse 66   Ht 6' (1.829 m)   Wt 200 lb (90.7 kg)   BMI 27.12 kg/m   Constitutional:  Alert and oriented, No acute distress. HEENT: [place] AT, moist mucus membranes.  Trachea midline, no masses. Cardiovascular: No clubbing, cyanosis, or edema. Respiratory: Normal respiratory effort, no increased work of breathing. Skin: No rashes, bruises or suspicious lesions. Neurologic: Grossly intact, no focal deficits, moving all 4 extremities. Psychiatric: Normal mood and affect.  Laboratory Data:  Lab Results  Component Value Date   CREATININE 1.10 09/26/2019    Lab Results  Component Value Date   PSA <0.1 07/28/2019   PSA 0.1 07/28/2018   PSA 0.72 04/15/2017    Lab Results  Component Value Date   HGBA1C 5.6 07/28/2019     Pertinent Imaging: Results for orders placed during the hospital encounter of 09/26/19  CT HEMATURIA WORKUP  Narrative CLINICAL DATA:  Hematuria.  EXAM: CT ABDOMEN AND PELVIS WITHOUT AND WITH CONTRAST  TECHNIQUE: Multidetector CT imaging of the abdomen and pelvis was performed following the standard protocol before and following the bolus administration of intravenous contrast.  CONTRAST:  122mL OMNIPAQUE IOHEXOL 300 MG/ML  SOLN  COMPARISON:  08/09/2008  FINDINGS: Lower chest: No acute abnormality.  Hepatobiliary: 1.6 cm cyst is identified along the posterior medial dome of right hepatic lobe, image 19/4. Within the anterior dome of liver there is a 0.8 x 0.7 cm low-density structure which is technically too small to reliably  characterize but favored to represent a small cyst.  Gallbladder normal.  No biliary ductal dilatation.  Pancreas: Unremarkable. No pancreatic ductal dilatation or surrounding inflammatory changes.  Spleen: Normal in size without focal abnormality.  Adrenals/Urinary Tract: Normal appearance of the adrenal glands. Nonenhancing hyperdense cyst arising from posterior cortex of the left mid kidney measures 1.5 cm, image 35/4. Cyst within the anterior cortex of the left mid kidney measures 1.5 cm. Additional multiple subcentimeter intermediate and low attenuation foci are noted in both kidneys. These are too small to reliably characterize. Focal area of cortical calcification and scarring is noted within the upper pole of left kidney measuring 9 mm. No kidney stones identified bilaterally. Duplicated left renal collecting system. No suspicious filling defects identified within the collecting systems bilaterally. No hydroureter or ureteral lithiasis identified. Urinary bladder is unremarkable.  Stomach/Bowel:  Small hiatal hernia. Stomach is otherwise unremarkable. Appendix appears normal. No evidence of bowel wall thickening, distention, or inflammatory changes.  Vascular/Lymphatic: Aortic atherosclerosis. No aneurysm. No enlarged abdominopelvic lymph nodes. 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  Reproductive: Several fiducial markers are noted within the prostate gland.  Other: No free fluid or fluid collections identified. Fat containing left inguinal hernia.  Musculoskeletal: No acute or significant osseous findings.  IMPRESSION: 1. No findings identified to explain patient's hematuria. No kidney stones identified bilaterally. 2. Left kidney Bosniak category 1 and 2 cysts. Additional, bilateral kidney lesions are less than 1 cm and too small to reliably characterize. 3. Aortic atherosclerosis. 4. 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 5. Fat containing left inguinal hernia.  Aortic Atherosclerosis (ICD10-I70.0).   Electronically Signed By: Kerby Moors M.D. On: 09/26/2019 09:07  I have personally reviewed the images and agree with radiologist interpretation.     Assessment & Plan:    1. Microscopic hematuria Given his age, smoking history and radiation to prostate pt is considered high risk. I strongly encourage patient undergo cystoscopy.  We had a lengthy discussion today regarding patient's risk factors as well as the rationale behind cystoscopy as CT urogram cannot definitively rule out bladder cancer.  He is risk factors for this including smoking and history of radiation to the pelvis I have offered him in the office Valium to help with his anxiety but the procedure as well as cystoscopy in the operating room if he cannot tolerate an in office procedure both of which he declined. Patient will call back with his decision-he was able to verbalize the rationale for recommendation of this but still is very hesitant and ultimately did not seem as if he will pursue this Patient's last cystoscopy was 8 years ago.   2. History of prostate cancer No evidence of disease  PSA on 07/28/19 was undetectable.   Continue annual PSA  3. History of RCC type 1 No further surveillance is needed at this time.   4. ED Controlled on Cialis.    [facility] [address] [place], [place] [address] [phone]  I, Selena Batten, am acting as a scribe for Dr. Hollice Espy.  I have reviewed the above documentation for accuracy and completeness, and I agree with the above.   Hollice Espy, MD

## 2019-11-02 ENCOUNTER — Ambulatory Visit (INDEPENDENT_AMBULATORY_CARE_PROVIDER_SITE_OTHER): Payer: Federal, State, Local not specified - PPO | Admitting: Urology

## 2019-11-02 ENCOUNTER — Other Ambulatory Visit: Payer: Self-pay

## 2019-11-02 ENCOUNTER — Encounter: Payer: Self-pay | Admitting: Urology

## 2019-11-02 VITALS — BP 120/75 | HR 66 | Ht 72.0 in | Wt 200.0 lb

## 2019-11-02 DIAGNOSIS — R319 Hematuria, unspecified: Secondary | ICD-10-CM | POA: Diagnosis not present

## 2019-11-08 ENCOUNTER — Ambulatory Visit (INDEPENDENT_AMBULATORY_CARE_PROVIDER_SITE_OTHER): Payer: Federal, State, Local not specified - PPO

## 2019-11-08 ENCOUNTER — Other Ambulatory Visit: Payer: Self-pay

## 2019-11-08 DIAGNOSIS — Z23 Encounter for immunization: Secondary | ICD-10-CM

## 2019-12-09 ENCOUNTER — Ambulatory Visit (INDEPENDENT_AMBULATORY_CARE_PROVIDER_SITE_OTHER): Payer: Federal, State, Local not specified - PPO

## 2019-12-09 ENCOUNTER — Encounter (INDEPENDENT_AMBULATORY_CARE_PROVIDER_SITE_OTHER): Payer: Self-pay | Admitting: Nurse Practitioner

## 2019-12-09 ENCOUNTER — Other Ambulatory Visit: Payer: Self-pay

## 2019-12-09 ENCOUNTER — Ambulatory Visit (INDEPENDENT_AMBULATORY_CARE_PROVIDER_SITE_OTHER): Payer: Federal, State, Local not specified - PPO | Admitting: Nurse Practitioner

## 2019-12-09 ENCOUNTER — Other Ambulatory Visit (INDEPENDENT_AMBULATORY_CARE_PROVIDER_SITE_OTHER): Payer: Self-pay | Admitting: Nurse Practitioner

## 2019-12-09 VITALS — BP 124/86 | HR 50 | Resp 16 | Wt 201.0 lb

## 2019-12-09 DIAGNOSIS — I10 Essential (primary) hypertension: Secondary | ICD-10-CM

## 2019-12-09 DIAGNOSIS — I739 Peripheral vascular disease, unspecified: Secondary | ICD-10-CM | POA: Diagnosis not present

## 2019-12-09 DIAGNOSIS — E785 Hyperlipidemia, unspecified: Secondary | ICD-10-CM

## 2019-12-09 NOTE — Progress Notes (Signed)
Subjective:    Patient ID: Roberto Walker, male    DOB: Jan 11, 1961, 59 y.o.   MRN: [id number] Chief Complaint  Patient presents with  . Follow-up    ultrasound follow up    The patient returns to the office for followup and review of the noninvasive studies. There have been no interval changes in lower extremity symptoms. No interval shortening of the patient's claudication distance or development of rest pain symptoms. No new ulcers or wounds have occurred since the last visit.  There have been no significant changes to the patient's overall health care.  The patient denies amaurosis fugax or recent TIA symptoms. There are no recent neurological changes noted. The patient denies history of DVT, PE or superficial thrombophlebitis. The patient denies recent episodes of angina or shortness of breath.   ABI Rt=1.31 and Lt=0.99  (previous ABI's Rt=1.24 and Lt=1.09) Duplex ultrasound of the reveals biphasic/monophasic waveforms in the left lower extremity.  The right lower extremity has triphasic waveforms throughout   Review of Systems  Cardiovascular: Negative for leg swelling.  All other systems reviewed and are negative.      Objective:   Physical Exam Vitals reviewed.  HENT:     Head: Normocephalic.  Cardiovascular:     Rate and Rhythm: Normal rate.     Pulses: Normal pulses.  Pulmonary:     Effort: Pulmonary effort is normal.  Neurological:     Mental Status: He is alert.     Motor: No weakness.     Gait: Gait normal.  Psychiatric:        Mood and Affect: Mood normal.        Behavior: Behavior normal.        Thought Content: Thought content normal.        Judgment: Judgment normal.     BP 124/86 (BP Location: Right Arm)   Pulse (!) 50   Resp 16   Wt 201 lb (91.2 kg)   BMI 27.26 kg/m   Past Medical History:  Diagnosis Date  . Hypercalcemia   . Hyperlipidemia   . Hypertension   . Metabolic syndrome   . Microscopic hematuria   . Osteoarthrosis   .  Peripheral vascular disease ([place])   . Prostate cancer ([place])   . Renal cell carcinoma ([place])   . Vitamin D deficiency   . Vitiligo     Social History   Socioeconomic History  . Marital status: Married    Spouse name: Mateo Flow  . Number of children: 2  . Years of education: College   . Highest education level: Not on file  Occupational History  . 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 Smoking status: Former Smoker    Packs/day: 0.50    Years: 20.00    Pack years: 10.00    Types: Cigarettes    Quit date: 01/12/2012    Years since quitting: 7.9  . Smokeless tobacco: Never Used  Vaping Use  . Vaping Use: Never used  Substance and Sexual Activity  . Alcohol use: Yes    Alcohol/week: 2.0 standard drinks    Types: 2 Shots of liquor per week    Comment: Minimal alcohol consumption  . Drug use: No  . Sexual activity: Yes    Partners: Female  Other Topics Concern  . Not on file  Social History Narrative  . Not on file   Social Determinants of Health   Financial Resource Strain: Low Risk   . Difficulty of Paying  Living Expenses: Not hard at all  Food Insecurity: No Food Insecurity  . Worried About Charity fundraiser in the Last Year: Never true  . Ran Out of Food in the Last Year: Never true  Transportation Needs: No Transportation Needs  . Lack of Transportation (Medical): No  . Lack of Transportation (Non-Medical): No  Physical Activity:   . Days of Exercise per Week: Not on file  . Minutes of Exercise per Session: Not on file  Stress:   . Feeling of Stress : Not on file  Social Connections: Socially Integrated  . Frequency of Communication with Friends and Family: More than three times a week  . Frequency of Social Gatherings with Friends and Family: More than three times a week  . Attends Religious Services: More than 4 times per year  . Active Member of Clubs or Organizations: Yes  . Attends Archivist Meetings: Never  . Marital Status: Married  Arboriculturist Violence: Not At Risk  . Fear of Current or Ex-Partner: No  . Emotionally Abused: No  . Physically Abused: No  . Sexually Abused: No    Past Surgical History:  Procedure Laterality Date  . Beam Radiation     Prostate using Photons  . ILIAC VEIN ANGIOPLASTY / STENTING    . LEFT HEART CATH  01/11/2012  . NEPHRECTOMY Right 2010   Partial    Family History  Problem Relation Age of Onset  . Hypertension Mother   . Lung cancer Father   . Hypertension Father   . Hyperlipidemia Father   . Diabetes Sister        oldest sister  . Hypertension Sister   . Hypertension Sister     Allergies  Allergen Reactions  . Lisinopril Swelling    Swelling around mouth, lips    CBC Latest Ref Rng & Units 07/28/2019 07/28/2018 07/28/2017  WBC 3.8 - 10.8 Thousand/uL 6.8 7.3 7.3  Hemoglobin 13.2 - 17.1 g/dL 13.5 13.4 13.3  Hematocrit 38 - 50 % 39.5 39.9 38.2(L)  Platelets 140 - 400 Thousand/uL 263 264 284      CMP     Component Value Date/Time   NA 138 07/28/2019 0910   NA 141 08/02/2015 0853   NA 139 05/17/2013 0447   K 4.4 07/28/2019 0910   K 4.0 05/17/2013 0447   CL 109 07/28/2019 0910   CL 109 (H) 05/17/2013 0447   CO2 25 07/28/2019 0910   CO2 23 05/17/2013 0447   GLUCOSE 103 (H) 07/28/2019 0910   GLUCOSE 150 (H) 05/17/2013 0447   BUN 17 07/28/2019 0910   BUN 15 08/02/2015 0853   BUN 19 (H) 05/17/2013 0447   CREATININE 1.10 09/26/2019 0814   CREATININE 1.07 07/28/2019 0910   CALCIUM 10.6 (H) 07/28/2019 0910   CALCIUM 9.1 05/17/2013 0447   PROT 7.0 07/28/2019 0910   PROT 7.2 08/02/2015 0853   PROT 7.5 05/02/2011 0943   ALBUMIN 3.9 06/06/2016 0804   ALBUMIN 4.2 08/02/2015 0853   ALBUMIN 3.9 05/02/2011 0943   AST 24 07/28/2019 0910   AST 31 05/02/2011 0943   ALT 33 07/28/2019 0910   ALT 37 05/02/2011 0943   ALKPHOS 52 06/06/2016 0804   ALKPHOS 54 05/02/2011 0943   BILITOT 0.5 07/28/2019 0910   BILITOT 0.3 08/02/2015 0853   BILITOT 0.5 05/02/2011 0943    GFRNONAA 76 07/28/2019 0910   GFRAA 88 07/28/2019 0910         Assessment & Plan:  1. Peripheral vascular disease ([place])  Recommend:  The patient has evidence of atherosclerosis of the lower extremities with claudication.  The patient does not voice lifestyle limiting changes at this point in time.  Noninvasive studies do not suggest clinically significant change.  No invasive studies, angiography or surgery at this time The patient should continue walking and begin a more formal exercise program.  The patient should continue antiplatelet therapy and aggressive treatment of the lipid abnormalities  No changes in the patient's medications at this time  The patient should continue wearing graduated compression socks 10-15 mmHg strength to control the mild edema.    2. Benign essential HTN Continue antihypertensive medications as already ordered, these medications have been reviewed and there are no changes at this time.   3. Dyslipidemia Continue statin as ordered and reviewed, no changes at this time    Current Outpatient Medications on File Prior to Visit  Medication Sig Dispense Refill  . amLODipine-olmesartan (AZOR) 10-40 MG tablet Take 1 tablet by mouth daily. 90 tablet 1  . aspirin 81 MG tablet Take 1 tablet by mouth daily.    Marland Kitchen atorvastatin (LIPITOR) 80 MG tablet Take 1 tablet (80 mg total) by mouth daily. 90 tablet 1  . carvedilol (COREG) 12.5 MG tablet Take 1 tablet (12.5 mg total) by mouth 2 (two) times daily. 180 tablet 1  . cholecalciferol (VITAMIN D) 1000 UNITS tablet Take 1 tablet by mouth daily.    . clopidogrel (PLAVIX) 75 MG tablet Take 1 tablet (75 mg total) by mouth daily. 90 tablet 1  . nitroGLYCERIN (NITROSTAT) 0.4 MG SL tablet Place 1 tablet (0.4 mg total) under the tongue daily. 30 tablet 0  . tadalafil (CIALIS) 20 MG tablet Take 0.5-1 tablets (10-20 mg total) by mouth every other day as needed for erectile dysfunction. 30 tablet 2   No current  facility-administered medications on file prior to visit.    There are no Patient Instructions on file for this visit. No follow-ups on file.   Kris Hartmann, NP

## 2019-12-16 ENCOUNTER — Encounter (INDEPENDENT_AMBULATORY_CARE_PROVIDER_SITE_OTHER)

## 2019-12-16 ENCOUNTER — Ambulatory Visit (INDEPENDENT_AMBULATORY_CARE_PROVIDER_SITE_OTHER): Admitting: Vascular Surgery

## 2020-01-10 NOTE — Progress Notes (Signed)
Name: Roberto Walker   MRN: [id number]    DOB: Nov 24, 1960   Date:01/11/2020       Progress Note  Subjective  Chief Complaint  Follow up  HPI  HTN: bp is at [place] home and also well controlled during recent visits to Urologist and vascular surgeon. He denies side effects of medications, denies decrease in exercise tolerance , chest pain, palpitation or dizziness. No decrease in exercise tolerance   History of CAD: cad S/P DES to LAD 01/11/2012, he also has 80% RCA disease who presents for f/u, doing well last EF 50 % in 2019 Taking statin therapy and aspirin daily    Hyperlipidemia: taking Atorvastatinthat he gets through the [place], and is compliant , denies myalgia.Labs were  reviewed and LDL was 49 but back in June it was 35 ,he is not sure why   OSA: hestates he has been wearing it for about 4 hours per night a few times a week.  No headaches in the mornings. He wakes up feeling refreshed He states not comfortable   History of kidney and prostate cancer : he is going to [facility] to check on prostate once every 12  months. S/p radiation .He was seeing Urologist at [facility] but is now going to [place], last PSA June 2021 was normal, because of some hematuria he had repeat CT in 09/2019 showed some cysts, advised to have cystoscopy but he chose to hold off for now. He denies any hematuria , no flank pain.   Aneurysm of right common iliac : incidental finding on CT done for hematuria , discussed need for yearly follow up with Dr. Lucky Cowboy   PVD: sees Dr. Lucky Cowboy once a year, no claudication, no leg swelling, s/p stent placed on left leg and is doing well, recent visit . He is on statin and plavix. Unchanged   Pre-diabetes: he has been following life style modification he denies polyphagia, polydipsia or polyuria. He is eats a balanced diet, grains, fish, chicken, vegetables. He does not eat fast food Last A1C was 5.6%   ED: he has difficulty initiating and maintaining erection since  prostate radiation therapy,he is now on Cialis. Unchanged    Patient Active Problem List   Diagnosis Date Noted  . Personal history of kidney cancer 02/06/2015  . Benign essential HTN 08/02/2014  . Coronary artery disease involving native coronary artery of native heart without angina pectoris 08/02/2014  . Dyslipidemia 08/02/2014  . H/O acute myocardial infarction 08/02/2014  . 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/02/2014  . Dysmetabolic syndrome 20/25/4270  . Arthritis, degenerative 08/02/2014  . Peripheral vascular disease ([place]) 08/02/2014  . Blood glucose elevated 08/02/2014  . Obstructive sleep apnea on CPAP 08/02/2014  . Vitamin D deficiency 08/02/2014  . Vitiligo 08/02/2014    Past Surgical History:  Procedure Laterality Date  . Beam Radiation     Prostate using Photons  . ILIAC VEIN ANGIOPLASTY / STENTING    . LEFT HEART CATH  01/11/2012  . NEPHRECTOMY Right 2010   Partial    Family History  Problem Relation Age of Onset  . Hypertension Mother   . Lung cancer Father   . Hypertension Father   . Hyperlipidemia Father   . Diabetes Sister        oldest sister  . Hypertension Sister   . Hypertension Sister     Social History   Tobacco Use  . Smoking status: Former Smoker    Packs/day: 0.50  Years: 20.00    Pack years: 10.00    Types: Cigarettes    Quit date: 01/12/2012    Years since quitting: 8.0  . Smokeless tobacco: Never Used  Substance Use Topics  . Alcohol use: Yes    Alcohol/week: 2.0 standard drinks    Types: 2 Shots of liquor per week    Comment: Minimal alcohol consumption     Current Outpatient Medications:  .  amLODipine-olmesartan (AZOR) 10-40 MG tablet, Take 1 tablet by mouth daily., Disp: 90 tablet, Rfl: 1 .  aspirin 81 MG tablet, Take 1 tablet by mouth daily., Disp: , Rfl:  .  atorvastatin (LIPITOR) 80 MG tablet, Take 1 tablet (80 mg total) by mouth daily., Disp: 90 tablet, Rfl: 1 .  carvedilol (COREG)  12.5 MG tablet, Take 1 tablet (12.5 mg total) by mouth 2 (two) times daily., Disp: 180 tablet, Rfl: 1 .  cholecalciferol (VITAMIN D) 1000 UNITS tablet, Take 1 tablet by mouth daily., Disp: , Rfl:  .  clopidogrel (PLAVIX) 75 MG tablet, Take 1 tablet (75 mg total) by mouth daily., Disp: 90 tablet, Rfl: 1 .  nitroGLYCERIN (NITROSTAT) 0.4 MG SL tablet, Place 1 tablet (0.4 mg total) under the tongue daily., Disp: 30 tablet, Rfl: 0 .  sildenafil (VIAGRA) 50 MG tablet, Take by mouth., Disp: , Rfl:  .  tadalafil (CIALIS) 20 MG tablet, Take 0.5-1 tablets (10-20 mg total) by mouth every other day as needed for erectile dysfunction., Disp: 30 tablet, Rfl: 2  Allergies  Allergen Reactions  . Lisinopril Swelling    Swelling around mouth, lips    I personally reviewed active problem list, medication list, allergies, family history, social history, health maintenance, notes from last encounter with the patient/caregiver today.   ROS  Constitutional: Negative for fever or weight change.  Respiratory: Negative for cough and shortness of breath.   Cardiovascular: Negative for chest pain or palpitations.  Gastrointestinal: Negative for abdominal pain, no bowel changes.  Musculoskeletal: Negative for gait problem or joint swelling.  Skin: Negative for rash.  Neurological: Negative for dizziness or headache.  No other specific complaints in a complete review of systems (except as listed in HPI above).  Objective  Vitals:   01/11/20 1444  BP: 126/82  Pulse: 60  Resp: 18  Temp: 98.1 F (36.7 C)  TempSrc: Oral  SpO2: 98%  Weight: 201 lb 6.4 oz (91.4 kg)  Height: 6' (1.829 m)    Body mass index is 27.31 kg/m.  Physical Exam  Constitutional: Patient appears well-developed and well-nourished. Overweight.  No distress.  HEENT: head atraumatic, normocephalic, pupils equal and reactive to light, neck supple Cardiovascular: Normal rate, regular rhythm and normal heart sounds.  No murmur heard. No  BLE edema. Pulmonary/Chest: Effort normal and breath sounds normal. No respiratory distress. Abdominal: Soft.  There is no tenderness. Psychiatric: Patient has a normal mood and affect. behavior is normal. Judgment and thought content normal.  PHQ2/9: Depression screen [facility] 2/9 01/11/2020 07/28/2019 01/25/2019 12/01/2018 07/28/2018  Decreased Interest 0 0 0 0 0  Down, Depressed, Hopeless 0 0 0 0 0  PHQ - 2 Score 0 0 0 0 0  Altered sleeping - 0 0 0 0  Tired, decreased energy - 0 0 0 0  Change in appetite - 0 0 0 0  Feeling bad or failure about yourself  - 0 0 0 0  Trouble concentrating - 0 0 0 0  Moving slowly or fidgety/restless - 0 0 0 0  Suicidal  thoughts - 0 0 0 0  PHQ-9 Score - 0 0 0 0  Difficult doing work/chores - - - Not difficult at all -    phq 9 is negative   Fall Risk: Fall Risk  01/11/2020 07/28/2019 01/25/2019 12/01/2018 07/28/2018  Falls in the past year? 0 0 0 0 0  Number falls in past yr: 0 0 0 0 0  Injury with Fall? 0 0 0 0 0     Functional Status Survey: Is the patient deaf or have difficulty hearing?: No Does the patient have difficulty seeing, even when wearing glasses/contacts?: No Does the patient have difficulty concentrating, remembering, or making decisions?: No Does the patient have difficulty walking or climbing stairs?: No Does the patient have difficulty dressing or bathing?: No Does the patient have difficulty doing errands alone such as visiting a doctor's office or shopping?: No    Assessment & Plan  1. Aneurysm of right iliac artery (HCC)  Discussed finding on CT   2. Vitamin D deficiency   3. Colon cancer screening  - Ambulatory referral to Gastroenterology  4. Dyslipidemia   5. Benign essential HTN  - amLODipine-olmesartan (AZOR) 10-40 MG tablet; Take 1 tablet by mouth daily.  Dispense: 90 tablet; Refill: 1  6. Hematuria, unspecified type   7. ED (erectile dysfunction) of organic origin   8. Arteriosclerosis of coronary  artery   9. Dysmetabolic syndrome   10. Obstructive sleep apnea on CPAP

## 2020-01-11 ENCOUNTER — Other Ambulatory Visit: Payer: Self-pay

## 2020-01-11 ENCOUNTER — Ambulatory Visit (INDEPENDENT_AMBULATORY_CARE_PROVIDER_SITE_OTHER): Payer: Federal, State, Local not specified - PPO | Admitting: Family Medicine

## 2020-01-11 ENCOUNTER — Encounter: Payer: Self-pay | Admitting: Family Medicine

## 2020-01-11 VITALS — BP 126/82 | HR 60 | Temp 98.1°F | Resp 18 | Ht 72.0 in | Wt 201.4 lb

## 2020-01-11 DIAGNOSIS — E785 Hyperlipidemia, unspecified: Secondary | ICD-10-CM

## 2020-01-11 DIAGNOSIS — Z1211 Encounter for screening for malignant neoplasm of colon: Secondary | ICD-10-CM

## 2020-01-11 DIAGNOSIS — N529 Male erectile dysfunction, unspecified: Secondary | ICD-10-CM

## 2020-01-11 DIAGNOSIS — I10 Essential (primary) hypertension: Secondary | ICD-10-CM

## 2020-01-11 DIAGNOSIS — G4733 Obstructive sleep apnea (adult) (pediatric): Secondary | ICD-10-CM

## 2020-01-11 DIAGNOSIS — R319 Hematuria, unspecified: Secondary | ICD-10-CM

## 2020-01-11 DIAGNOSIS — I723 Aneurysm of iliac artery: Secondary | ICD-10-CM

## 2020-01-11 DIAGNOSIS — Z9989 Dependence on other enabling machines and devices: Secondary | ICD-10-CM

## 2020-01-11 DIAGNOSIS — I2510 Atherosclerotic heart disease of native coronary artery without angina pectoris: Secondary | ICD-10-CM

## 2020-01-11 DIAGNOSIS — E559 Vitamin D deficiency, unspecified: Secondary | ICD-10-CM

## 2020-01-11 DIAGNOSIS — E8881 Metabolic syndrome: Secondary | ICD-10-CM

## 2020-01-11 MED ORDER — AMLODIPINE-OLMESARTAN 10-40 MG PO TABS: 1.0000 | ORAL_TABLET | Freq: Every day | ORAL | 1 refills | Status: DC

## 2020-01-19 ENCOUNTER — Encounter: Payer: Self-pay | Admitting: *Deleted

## 2020-04-04 DIAGNOSIS — Z20822 Contact with and (suspected) exposure to covid-19: Secondary | ICD-10-CM | POA: Diagnosis not present

## 2020-04-04 DIAGNOSIS — Z03818 Encounter for observation for suspected exposure to other biological agents ruled out: Secondary | ICD-10-CM | POA: Diagnosis not present

## 2020-05-25 ENCOUNTER — Ambulatory Visit

## 2020-06-22 ENCOUNTER — Other Ambulatory Visit: Payer: Self-pay

## 2020-06-22 ENCOUNTER — Ambulatory Visit: Attending: Internal Medicine

## 2020-06-22 DIAGNOSIS — Z23 Encounter for immunization: Secondary | ICD-10-CM

## 2020-06-22 MED ORDER — PFIZER-BIONT COVID-19 VAC-TRIS 30 MCG/0.3ML IM SUSP
INTRAMUSCULAR | 0 refills | Status: DC
  Filled 2020-06-22: qty 0.3, 1d supply, fill #0

## 2020-06-22 NOTE — Progress Notes (Signed)
   Covid-19 Vaccination Clinic  Name:  Roberto Walker    MRN: [id number] DOB: 11-27-1960  06/22/2020  Mr. Roberto Walker was observed post Covid-19 immunization for 15 minutes without incident. He was provided with Vaccine Information Sheet and instruction to access the V-Safe system.   Mr. Roberto Walker was instructed to call 911 with any severe reactions post vaccine: Marland Kitchen Difficulty breathing  . Swelling of face and throat  . A fast heartbeat  . A bad rash all over body  . Dizziness and weakness   Immunizations Administered    Name Date Dose VIS Date Route   PFIZER Comrnaty(Gray TOP) Covid-19 Vaccine 06/22/2020 10:28 AM 0.3 mL 01/12/2020 Intramuscular   Manufacturer: [place]   Lot: [id number]   NDC: [place], PharmD, MBA  Clinical Acute Care Pharmacist

## 2020-07-30 NOTE — Patient Instructions (Signed)
Preventive Care 40-61 Years Old, Male Preventive care refers to lifestyle choices and visits with your health care provider that can promote health and wellness. This includes: A yearly physical exam. This is also called an annual wellness visit. Regular dental and eye exams. Immunizations. Screening for certain conditions. Healthy lifestyle choices, such as: Eating a healthy diet. Getting regular exercise. Not using drugs or products that contain nicotine and tobacco. Limiting alcohol use. What can I expect for my preventive care visit? Physical exam Your health care provider will check your: Height and weight. These may be used to calculate your BMI (body mass index). BMI is a measurement that tells if you are at a healthy weight. Heart rate and blood pressure. Body temperature. Skin for abnormal spots. Counseling Your health care provider may ask you questions about your: Past medical problems. Family's medical history. Alcohol, tobacco, and drug use. Emotional well-being. Home life and relationship well-being. Sexual activity. Diet, exercise, and sleep habits. Work and work environment. Access to firearms. What immunizations do I need?  Vaccines are usually given at various ages, according to a schedule. Your health care provider will recommend vaccines for you based on your age, medicalhistory, and lifestyle or other factors, such as travel or where you work. What tests do I need? Blood tests Lipid and cholesterol levels. These may be checked every 5 years, or more often if you are over 61 years old. Hepatitis C test. Hepatitis B test. Screening Lung cancer screening. You may have this screening every year starting at age 61 if you have a 30-pack-year history of smoking and currently smoke or have quit within the past 15 years. Prostate cancer screening. Recommendations will vary depending on your family history and other risks. Genital exam to check for testicular cancer  or hernias. Colorectal cancer screening. All adults should have this screening starting at age 61 and continuing until age 61. Your health care provider may recommend screening at age 45 if you are at increased risk. You will have tests every 1-10 years, depending on your results and the type of screening test. Diabetes screening. This is done by checking your blood sugar (glucose) after you have not eaten for a while (fasting). You may have this done every 1-3 years. STD (sexually transmitted disease) testing, if you are at risk. Follow these instructions at home: Eating and drinking  Eat a diet that includes fresh fruits and vegetables, whole grains, lean protein, and low-fat dairy products. Take vitamin and mineral supplements as recommended by your health care provider. Do not drink alcohol if your health care provider tells you not to drink. If you drink alcohol: Limit how much you have to 0-2 drinks a day. Be aware of how much alcohol is in your drink. In the [place] one drink equals one 12 oz bottle of beer (355 mL), one 5 oz glass of wine (148 mL), or one 1 oz glass of hard liquor (44 mL).  Lifestyle Take daily care of your teeth and gums. Brush your teeth every morning and night with fluoride toothpaste. Floss one time each day. Stay active. Exercise for at least 30 minutes 5 or more days each week. Do not use any products that contain nicotine or tobacco, such as cigarettes, e-cigarettes, and chewing tobacco. If you need help quitting, ask your health care provider. Do not use drugs. If you are sexually active, practice safe sex. Use a condom or other form of protection to prevent STIs (sexually transmitted infections). If told by   your health care provider, take low-dose aspirin daily starting at age 61. Find healthy ways to cope with stress, such as: Meditation, yoga, or listening to music. Journaling. Talking to a trusted person. Spending time with friends and  family. Safety Always wear your seat belt while driving or riding in a vehicle. Do not drive: If you have been drinking alcohol. Do not ride with someone who has been drinking. When you are tired or distracted. While texting. Wear a helmet and other protective equipment during sports activities. If you have firearms in your house, make sure you follow all gun safety procedures. What's next? Go to your health care provider once a year for an annual wellness visit. Ask your health care provider how often you should have your eyes and teeth checked. Stay up to date on all vaccines. This information is not intended to replace advice given to you by your health care provider. Make sure you discuss any questions you have with your healthcare provider. Document Revised: 10/19/2018 Document Reviewed: 01/14/2018 Elsevier Patient Education  2022 [employer] Inc[employer]  

## 2020-07-30 NOTE — Progress Notes (Signed)
Name: Roberto Walker   MRN: [id number]    DOB: 06/09/1960   Date:07/31/2020       Progress Note  Subjective  Chief Complaint  Annual Exam  HPI  Patient presents for annual CPE , he will return at the end of the year for follow up.  IPSS Questionnaire (AUA-7): Over the past month.   1)  How often have you had a sensation of not emptying your bladder completely after you finish urinating?  0 - Not at all  2)  How often have you had to urinate again less than two hours after you finished urinating? 0 - Not at all  3)  How often have you found you stopped and started again several times when you urinated?  0 - Not at all  4) How difficult have you found it to postpone urination?  0 - Not at all  5) How often have you had a weak urinary stream?  0 - Not at all  6) How often have you had to push or strain to begin urination?  0 - Not at all  7) How many times did you most typically get up to urinate from the time you went to bed until the time you got up in the morning?  1 - 1 time  Total score:  0-7 mildly symptomatic   8-19 moderately symptomatic   20-35 severely symptomatic     Diet: balanced , lots of fruit , vegetables, very little red meat and fried food  Exercise: he is working out for 1 hour per week   Depression: phq 9 is negative Depression screen [facility] 2/9 07/31/2020 01/11/2020 07/28/2019 01/25/2019 12/01/2018  Decreased Interest 0 0 0 0 0  Down, Depressed, Hopeless 0 0 0 0 0  PHQ - 2 Score 0 0 0 0 0  Altered sleeping - - 0 0 0  Tired, decreased energy - - 0 0 0  Change in appetite - - 0 0 0  Feeling bad or failure about yourself  - - 0 0 0  Trouble concentrating - - 0 0 0  Moving slowly or fidgety/restless - - 0 0 0  Suicidal thoughts - - 0 0 0  PHQ-9 Score - - 0 0 0  Difficult doing work/chores - - - - Not difficult at all    Hypertension:  BP Readings from Last 3 Encounters:  07/31/20 110/76  01/11/20 126/82  12/09/19 124/86    Obesity: Wt Readings from Last 3  Encounters:  07/31/20 202 lb (91.6 kg)  01/11/20 201 lb 6.4 oz (91.4 kg)  12/09/19 201 lb (91.2 kg)   BMI Readings from Last 3 Encounters:  07/31/20 27.40 kg/m  01/11/20 27.31 kg/m  12/09/19 27.26 kg/m     Lipids:  Lab Results  Component Value Date   CHOL 143 07/28/2019   CHOL 124 07/28/2018   CHOL 126 07/28/2017   Lab Results  Component Value Date   HDL 59 07/28/2019   HDL 61 07/28/2018   HDL 55 07/28/2017   Lab Results  Component Value Date   LDLCALC 71 07/28/2019   LDLCALC 49 07/28/2018   LDLCALC 58 07/28/2017   Lab Results  Component Value Date   TRIG 57 07/28/2019   TRIG 59 07/28/2018   TRIG 57 07/28/2017   Lab Results  Component Value Date   CHOLHDL 2.4 07/28/2019   CHOLHDL 2.0 07/28/2018   CHOLHDL 2.3 07/28/2017   No results found for: LDLDIRECT Glucose:  Glucose  Date Value Ref Range Status  05/17/2013 150 (H) 65 - 99 mg/dL Final  05/16/2013 111 (H) 65 - 99 mg/dL Final  03/08/2013 127 (H) 65 - 99 mg/dL Final   Glucose, Bld  Date Value Ref Range Status  07/28/2019 103 (H) 65 - 99 mg/dL Final    Comment:    .            Fasting reference interval . For someone without known diabetes, a glucose value between 100 and 125 mg/dL is consistent with prediabetes and should be confirmed with a follow-up test. .   07/28/2018 110 (H) 65 - 99 mg/dL Final    Comment:    .            Fasting reference interval . For someone without known diabetes, a glucose value between 100 and 125 mg/dL is consistent with prediabetes and should be confirmed with a follow-up test. .   07/28/2017 110 65 - 139 mg/dL Final    Comment:    .        Non-fasting reference interval .     [place] Office Visit from 01/11/2020 in [facility]  AUDIT-C Score 1       Married STD testing and prevention (HIV/chl/gon/syphilis):not interested   Hep C: 07/20/12  Skin cancer: Discussed monitoring for atypical lesions Colorectal cancer:  Ordered today Prostate cancer:  Lab Results  Component Value Date   PSA <0.1 07/28/2019   PSA 0.1 07/28/2018   PSA 0.72 04/15/2017     Lung cancer:  Low Dose CT Chest recommended if Age 25-80 years, 30 pack-year currently smoking OR have quit w/in 15years. Patient does not qualify.   AAA: he has a small aneurysm not in aorta, but sees vascular surgeon yearly  ECG:  03/08/13  Vaccines:   Shingrix: refused  Pneumonia: refused  Flu: yearly   Advanced Care Planning: A voluntary discussion about advance care planning including the explanation and discussion of advance directives.  Discussed health care proxy and Living will, and the patient was able to identify a health care proxy as wife .   Patient Active Problem List   Diagnosis Date Noted   Personal history of kidney cancer 02/06/2015   Benign essential HTN 08/02/2014   Coronary artery disease involving native coronary artery of native heart without angina pectoris 08/02/2014   Dyslipidemia 08/02/2014   H/O acute myocardial infarction 08/02/2014   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   Impotence of organic origin 45/80/9983   Dysmetabolic syndrome 38/25/0539   Arthritis, degenerative 08/02/2014   Peripheral vascular disease ([place]) 08/02/2014   Blood glucose elevated 08/02/2014   Obstructive sleep apnea on CPAP 08/02/2014   Vitamin D deficiency 08/02/2014   Vitiligo 08/02/2014    Past Surgical History:  Procedure Laterality Date   Beam Radiation     Prostate using Photons   ILIAC VEIN ANGIOPLASTY / STENTING     LEFT HEART CATH  01/11/2012   NEPHRECTOMY Right 2010   Partial    Family History  Problem Relation Age of Onset   Hypertension Mother    Lung cancer Father    Hypertension Father    Hyperlipidemia Father    Diabetes Sister        oldest sister   Hypertension Sister    Hypertension Sister     Social History   Socioeconomic History   Marital status: Married    Spouse name: Mateo Flow   Number of children: 2    Years of  education: College    Highest education level: Not on file  Occupational History   Occupation: RDU  Tobacco Use   Smoking status: Former    Packs/day: 0.50    Years: 20.00    Pack years: 10.00    Types: Cigarettes    Quit date: 01/12/2012    Years since quitting: 8.5   Smokeless tobacco: Never  Vaping Use   Vaping Use: Never used  Substance and Sexual Activity   Alcohol use: Yes    Alcohol/week: 2.0 standard drinks    Types: 2 Shots of liquor per week    Comment: Minimal alcohol consumption   Drug use: No   Sexual activity: Yes    Partners: Female  Other Topics Concern   Not on file  Social History Narrative   Not on file   Social Determinants of Health   Financial Resource Strain: Low Risk    Difficulty of Paying Living Expenses: Not hard at all  Food Insecurity: No Food Insecurity   Worried About Charity fundraiser in the Last Year: Never true   [place] in the Last Year: Never true  Transportation Needs: No Transportation Needs   Lack of Transportation (Medical): No   Lack of Transportation (Non-Medical): No  Physical Activity: Sufficiently Active   Days of Exercise per Week: 5 days   Minutes of Exercise per Session: 60 min  Stress: No Stress Concern Present   Feeling of Stress : Not at all  Social Connections: Unknown   Frequency of Communication with Friends and Family: Three times a week   Frequency of Social Gatherings with Friends and Family: More than three times a week   Attends Religious Services: More than 4 times per year   Active Member of Clubs or Organizations: Patient refused   Attends Music therapist: Patient refused   Marital Status: Married  Human resources officer Violence: Not At Risk   Fear of Current or Ex-Partner: No   Emotionally Abused: No   Physically Abused: No   Sexually Abused: No     Current Outpatient Medications:    amLODipine-olmesartan (AZOR) 10-40 MG tablet, Take 1 tablet by mouth daily., Disp: 90  tablet, Rfl: 1   aspirin 81 MG tablet, Take 1 tablet by mouth daily., Disp: , Rfl:    atorvastatin (LIPITOR) 80 MG tablet, Take 1 tablet (80 mg total) by mouth daily., Disp: 90 tablet, Rfl: 1   carvedilol (COREG) 12.5 MG tablet, Take 1 tablet (12.5 mg total) by mouth 2 (two) times daily., Disp: 180 tablet, Rfl: 1   cholecalciferol (VITAMIN D) 1000 UNITS tablet, Take 1 tablet by mouth daily., Disp: , Rfl:    clopidogrel (PLAVIX) 75 MG tablet, Take 1 tablet (75 mg total) by mouth daily., Disp: 90 tablet, Rfl: 1   COVID-19 mRNA Vac-TriS, Pfizer, (PFIZER-BIONT COVID-19 VAC-TRIS) SUSP injection, Inject into the muscle., Disp: 0.3 mL, Rfl: 0   nitroGLYCERIN (NITROSTAT) 0.4 MG SL tablet, Place 1 tablet (0.4 mg total) under the tongue daily., Disp: 30 tablet, Rfl: 0   sildenafil (VIAGRA) 50 MG tablet, Take by mouth., Disp: , Rfl:    tadalafil (CIALIS) 20 MG tablet, Take 0.5-1 tablets (10-20 mg total) by mouth every other day as needed for erectile dysfunction., Disp: 30 tablet, Rfl: 2  Allergies  Allergen Reactions   Lisinopril Swelling    Swelling around mouth, lips     ROS  Constitutional: Negative for fever or weight change.  Respiratory: Negative for cough  and shortness of breath.   Cardiovascular: Negative for chest pain or palpitations.  Gastrointestinal: Negative for abdominal pain, no bowel changes.  Musculoskeletal: Negative for gait problem or joint swelling.  Skin: Negative for rash.  Neurological: Negative for dizziness or headache.  No other specific complaints in a complete review of systems (except as listed in HPI above).    Objective  Vitals:   07/31/20 1002  BP: 110/76  Pulse: 71  Resp: 16  Temp: 98.3 F (36.8 C)  TempSrc: Oral  SpO2: 98%  Weight: 202 lb (91.6 kg)  Height: 6' (1.829 m)    Body mass index is 27.4 kg/m.  Physical Exam  Constitutional: Patient appears well-developed and well-nourished. No distress.  HENT: Head: Normocephalic and atraumatic.  Ears: B TMs ok, no erythema or effusion; Nose: Nose normal. Mouth/Throat: Oropharynx is clear and moist. No oropharyngeal exudate.  Eyes: Conjunctivae and EOM are normal. Pupils are equal, round, and reactive to light. No scleral icterus.  Neck: Normal range of motion. Neck supple. No JVD present. No thyromegaly present.  Cardiovascular: Normal rate, regular rhythm and normal heart sounds.  No murmur heard. No BLE edema. Pulmonary/Chest: Effort normal and breath sounds normal. No respiratory distress. Abdominal: Soft. Bowel sounds are normal, no distension. There is no tenderness. no masses MALE GENITALIA: Normal descended testes bilaterally, no masses palpated, no hernias, no lesions, no discharge RECTAL: not done  Musculoskeletal: Normal range of motion, no joint effusions. No gross deformities Neurological: he is alert and oriented to person, place, and time. No cranial nerve deficit. Coordination, balance, strength, speech and gait are normal.  Skin: Skin is warm and dry. No rash noted. No erythema.  Psychiatric: Patient has a normal mood and affect. behavior is normal. Judgment and thought content normal.   Fall Risk: Fall Risk  07/31/2020 01/11/2020 07/28/2019 01/25/2019 12/01/2018  Falls in the past year? 0 0 0 0 0  Number falls in past yr: 0 0 0 0 0  Injury with Fall? 0 0 0 0 0      Functional Status Survey: Is the patient deaf or have difficulty hearing?: No Does the patient have difficulty seeing, even when wearing glasses/contacts?: No Does the patient have difficulty concentrating, remembering, or making decisions?: No Does the patient have difficulty walking or climbing stairs?: No Does the patient have difficulty dressing or bathing?: No Does the patient have difficulty doing errands alone such as visiting a doctor's office or shopping?: No    Assessment & Plan  1. Well adult exam   2. Colon cancer screening  - Ambulatory referral to Gastroenterology  3. Vitamin D  deficiency  - VITAMIN D 25 Hydroxy (Vit-D Deficiency, Fractures)  4. Benign essential HTN  - Microalbumin / creatinine urine ratio - COMPLETE METABOLIC PANEL WITH GFR  5. Arteriosclerosis of coronary artery  - Lipid panel  6. Hematuria, unspecified type  - CBC with Differential/Platelet - COMPLETE METABOLIC PANEL WITH GFR  7. Screening for diabetes mellitus  - hemoglobin A1c  8. Aneurysm of right iliac artery (HCC)   9. Personal history of prostate cancer  - PSA  10. Hypercalcemia  - Parathyroid hormone, intact (no Ca)  11. Need for shingles vaccine  Refused   12. Need for pneumococcal vaccination  Refused   13. Atherosclerosis of aorta [facility])    Reviewed CT with patient on statin therapy   -Prostate cancer screening and PSA options (with potential risks and benefits of testing vs not testing) were discussed along with  recent recs/guidelines. -USPSTF grade A and B recommendations reviewed with patient; age-appropriate recommendations, preventive care, screening tests, etc discussed and encouraged; healthy living encouraged; see AVS for patient education given to patient -Discussed importance of 150 minutes of physical activity weekly, eat two servings of fish weekly, eat one serving of tree nuts ( cashews, pistachios, pecans, almonds.Marland Kitchen) every other day, eat 6 servings of fruit/vegetables daily and drink plenty of water and avoid sweet beverages.

## 2020-07-31 ENCOUNTER — Encounter: Payer: Self-pay | Admitting: Family Medicine

## 2020-07-31 ENCOUNTER — Ambulatory Visit (INDEPENDENT_AMBULATORY_CARE_PROVIDER_SITE_OTHER): Payer: Federal, State, Local not specified - PPO | Admitting: Family Medicine

## 2020-07-31 ENCOUNTER — Other Ambulatory Visit: Payer: Self-pay

## 2020-07-31 VITALS — BP 110/76 | HR 71 | Temp 98.3°F | Resp 16 | Ht 72.0 in | Wt 202.0 lb

## 2020-07-31 DIAGNOSIS — Z23 Encounter for immunization: Secondary | ICD-10-CM

## 2020-07-31 DIAGNOSIS — I10 Essential (primary) hypertension: Secondary | ICD-10-CM

## 2020-07-31 DIAGNOSIS — I2510 Atherosclerotic heart disease of native coronary artery without angina pectoris: Secondary | ICD-10-CM | POA: Diagnosis not present

## 2020-07-31 DIAGNOSIS — Z0000 Encounter for general adult medical examination without abnormal findings: Secondary | ICD-10-CM | POA: Diagnosis not present

## 2020-07-31 DIAGNOSIS — Z8546 Personal history of malignant neoplasm of prostate: Secondary | ICD-10-CM

## 2020-07-31 DIAGNOSIS — Z1211 Encounter for screening for malignant neoplasm of colon: Secondary | ICD-10-CM | POA: Diagnosis not present

## 2020-07-31 DIAGNOSIS — R319 Hematuria, unspecified: Secondary | ICD-10-CM | POA: Diagnosis not present

## 2020-07-31 DIAGNOSIS — Z131 Encounter for screening for diabetes mellitus: Secondary | ICD-10-CM

## 2020-07-31 DIAGNOSIS — I700 Atherosclerosis of aorta: Secondary | ICD-10-CM

## 2020-07-31 DIAGNOSIS — E559 Vitamin D deficiency, unspecified: Secondary | ICD-10-CM | POA: Diagnosis not present

## 2020-07-31 DIAGNOSIS — E785 Hyperlipidemia, unspecified: Secondary | ICD-10-CM | POA: Diagnosis not present

## 2020-07-31 DIAGNOSIS — I723 Aneurysm of iliac artery: Secondary | ICD-10-CM

## 2020-08-01 LAB — COMPLETE METABOLIC PANEL WITH GFR
AG Ratio: 1.4 (calc) (ref 1.0–2.5)
ALT: 32 U/L (ref 9–46)
AST: 25 U/L (ref 10–35)
Albumin: 4.3 g/dL (ref 3.6–5.1)
Alkaline phosphatase (APISO): 65 U/L (ref 35–144)
BUN: 16 mg/dL (ref 7–25)
CO2: 20 mmol/L (ref 20–32)
Calcium: 10.1 mg/dL (ref 8.6–10.3)
Chloride: 110 mmol/L (ref 98–110)
Creat: 1.04 mg/dL (ref 0.70–1.25)
GFR, Est African American: 90 mL/min/{1.73_m2} (ref >=60)
GFR, Est Non African American: 78 mL/min/{1.73_m2} (ref >=60)
Globulin: 3 g/dL (calc) (ref 1.9–3.7)
Glucose, Bld: 97 mg/dL (ref 65–99)
Potassium: 4.1 mmol/L (ref 3.5–5.3)
Sodium: 138 mmol/L (ref 135–146)
Total Bilirubin: 0.5 mg/dL (ref 0.2–1.2)
Total Protein: 7.3 g/dL (ref 6.1–8.1)

## 2020-08-01 LAB — MICROALBUMIN / CREATININE URINE RATIO
Creatinine, Urine: 263 mg/dL (ref 20–320)
Microalb Creat Ratio: 6 mcg/mg creat (ref <30)
Microalb, Ur: 1.5 mg/dL

## 2020-08-01 LAB — CBC WITH DIFFERENTIAL/PLATELET
Absolute Monocytes: 619 cells/uL (ref 200–950)
Basophils Absolute: 9 cells/uL (ref 0–200)
Basophils Relative: 0.1 %
Eosinophils Absolute: 118 cells/uL (ref 15–500)
Eosinophils Relative: 1.3 %
HCT: 41.1 % (ref 38.5–50.0)
Hemoglobin: 14.3 g/dL (ref 13.2–17.1)
Lymphs Abs: 2539 cells/uL (ref 850–3900)
MCH: 33.8 pg — ABNORMAL HIGH (ref 27.0–33.0)
MCHC: 34.8 g/dL (ref 32.0–36.0)
MCV: 97.2 fL (ref 80.0–100.0)
MPV: 8.8 fL (ref 7.5–12.5)
Monocytes Relative: 6.8 %
Neutro Abs: 5815 cells/uL (ref 1500–7800)
Neutrophils Relative %: 63.9 %
Platelets: 286 10*3/uL (ref 140–400)
RBC: 4.23 10*6/uL (ref 4.20–5.80)
RDW: 13.3 % (ref 11.0–15.0)
Total Lymphocyte: 27.9 %
WBC: 9.1 10*3/uL (ref 3.8–10.8)

## 2020-08-01 LAB — PARATHYROID HORMONE, INTACT (NO CA): PTH: 41 pg/mL (ref 16–77)

## 2020-08-01 LAB — LIPID PANEL
Cholesterol: 126 mg/dL (ref <200)
HDL: 56 mg/dL (ref >=40)
LDL Cholesterol (Calc): 55 mg/dL (calc)
Non-HDL Cholesterol (Calc): 70 mg/dL (calc) (ref <130)
Total CHOL/HDL Ratio: 2.3 (calc) (ref <5.0)
Triglycerides: 74 mg/dL (ref <150)

## 2020-08-01 LAB — VITAMIN D 25 HYDROXY (VIT D DEFICIENCY, FRACTURES): Vit D, 25-Hydroxy: 48 ng/mL (ref 30–100)

## 2020-08-01 LAB — HEMOGLOBIN A1C
Hgb A1c MFr Bld: 5.8 % of total Hgb — ABNORMAL HIGH (ref <5.7)
Mean Plasma Glucose: 120 mg/dL
eAG (mmol/L): 6.6 mmol/L

## 2020-08-01 LAB — PSA: PSA: 0.06 ng/mL (ref <=4.00)

## 2020-08-08 ENCOUNTER — Other Ambulatory Visit: Payer: Self-pay

## 2020-08-08 ENCOUNTER — Encounter: Payer: Self-pay | Admitting: Family Medicine

## 2020-08-08 DIAGNOSIS — I700 Atherosclerosis of aorta: Secondary | ICD-10-CM

## 2020-08-08 DIAGNOSIS — I10 Essential (primary) hypertension: Secondary | ICD-10-CM

## 2020-08-08 DIAGNOSIS — I723 Aneurysm of iliac artery: Secondary | ICD-10-CM

## 2020-08-08 DIAGNOSIS — I2510 Atherosclerotic heart disease of native coronary artery without angina pectoris: Secondary | ICD-10-CM

## 2020-08-09 DIAGNOSIS — Z20822 Contact with and (suspected) exposure to covid-19: Secondary | ICD-10-CM | POA: Diagnosis not present

## 2020-08-29 ENCOUNTER — Telehealth (INDEPENDENT_AMBULATORY_CARE_PROVIDER_SITE_OTHER): Payer: Self-pay | Admitting: Gastroenterology

## 2020-08-29 DIAGNOSIS — Z86010 Personal history of colonic polyps: Secondary | ICD-10-CM

## 2020-08-29 MED ORDER — NA SULFATE-K SULFATE-MG SULF 17.5-3.13-1.6 GM/177ML PO SOLN: 1.0000 | Freq: Once | ORAL | 0 refills | Status: AC

## 2020-08-29 NOTE — Progress Notes (Signed)
Gastroenterology Pre-Procedure Review  Request Date: 09/18/20 Requesting Physician: Dr. Bonna Gains  PATIENT REVIEW QUESTIONS: The patient responded to the following health history questions as indicated:    1. Are you having any GI issues? no 2. Do you have a personal history of Polyps? yes (10 years ago ) 3. Do you have a family history of Colon Cancer or Polyps? no 4. Diabetes Mellitus? no 5. Joint replacements in the past 12 months?no 6. Major health problems in the past 3 months?no 7. Any artificial heart valves, MVP, or defibrillator?no    MEDICATIONS & ALLERGIES:    Patient reports the following regarding taking any anticoagulation/antiplatelet therapy:   Plavix, Coumadin, Eliquis, Xarelto, Lovenox, Pradaxa, Brilinta, or Effient? yes (Plavix 75 mg) Aspirin? yes (81 mg)  Patient confirms/reports the following medications:  Current Outpatient Medications  Medication Sig Dispense Refill   amLODipine-olmesartan (AZOR) 10-40 MG tablet Take 1 tablet by mouth daily. 90 tablet 1   aspirin 81 MG tablet Take 1 tablet by mouth daily.     atorvastatin (LIPITOR) 80 MG tablet Take 1 tablet (80 mg total) by mouth daily. 90 tablet 1   carvedilol (COREG) 12.5 MG tablet Take 1 tablet (12.5 mg total) by mouth 2 (two) times daily. 180 tablet 1   cholecalciferol (VITAMIN D) 1000 UNITS tablet Take 1 tablet by mouth daily.     clopidogrel (PLAVIX) 75 MG tablet Take 1 tablet (75 mg total) by mouth daily. 90 tablet 1   COVID-19 mRNA Vac-TriS, Pfizer, (PFIZER-BIONT COVID-19 VAC-TRIS) SUSP injection Inject into the muscle. 0.3 mL 0   nitroGLYCERIN (NITROSTAT) 0.4 MG SL tablet Place 1 tablet (0.4 mg total) under the tongue daily. 30 tablet 0   sildenafil (VIAGRA) 50 MG tablet Take by mouth.     No current facility-administered medications for this visit.    Patient confirms/reports the following allergies:  Allergies  Allergen Reactions   Lisinopril Swelling    Swelling around mouth, lips    No  orders of the defined types were placed in this encounter.   AUTHORIZATION INFORMATION Primary Insurance: 1D#: Group #:  Secondary Insurance: 1D#: Group #:  SCHEDULE INFORMATION: Date: 09/18/20 Time: Location: [place]

## 2020-09-04 ENCOUNTER — Telehealth: Payer: Self-pay

## 2020-09-04 NOTE — Telephone Encounter (Signed)
Contacted patient regarding the blood thinner clearance. Per Dr. Ancil Boozer patient is to stop Plavix 5 days prior to procedure and restart 2 days after. Pt verbalized understanding.

## 2020-09-17 ENCOUNTER — Encounter: Payer: Self-pay | Admitting: Gastroenterology

## 2020-09-18 ENCOUNTER — Ambulatory Visit: Payer: Federal, State, Local not specified - PPO | Admitting: Certified Registered Nurse Anesthetist

## 2020-09-18 ENCOUNTER — Encounter: Payer: Self-pay | Admitting: Gastroenterology

## 2020-09-18 ENCOUNTER — Ambulatory Visit
Admission: RE | Admit: 2020-09-18 | Discharge: 2020-09-18 | Disposition: A | Discharge status: Home / Self Care | Payer: Federal, State, Local not specified - PPO | Attending: Gastroenterology | Admitting: Gastroenterology

## 2020-09-18 ENCOUNTER — Other Ambulatory Visit: Payer: Self-pay

## 2020-09-18 ENCOUNTER — Encounter
Admission: RE | Disposition: A | Discharge status: Home / Self Care | Payer: Self-pay | Source: Home / Self Care | Attending: Gastroenterology

## 2020-09-18 DIAGNOSIS — Z7982 Long term (current) use of aspirin: Secondary | ICD-10-CM | POA: Diagnosis not present

## 2020-09-18 DIAGNOSIS — Z860100 Personal history of colon polyps, unspecified: Secondary | ICD-10-CM

## 2020-09-18 DIAGNOSIS — Z79899 Other long term (current) drug therapy: Secondary | ICD-10-CM | POA: Insufficient documentation

## 2020-09-18 DIAGNOSIS — K529 Noninfective gastroenteritis and colitis, unspecified: Secondary | ICD-10-CM | POA: Insufficient documentation

## 2020-09-18 DIAGNOSIS — Z888 Allergy status to other drugs, medicaments and biological substances status: Secondary | ICD-10-CM | POA: Diagnosis not present

## 2020-09-18 DIAGNOSIS — Z7902 Long term (current) use of antithrombotics/antiplatelets: Secondary | ICD-10-CM | POA: Insufficient documentation

## 2020-09-18 DIAGNOSIS — Z1211 Encounter for screening for malignant neoplasm of colon: Secondary | ICD-10-CM | POA: Insufficient documentation

## 2020-09-18 DIAGNOSIS — K635 Polyp of colon: Secondary | ICD-10-CM

## 2020-09-18 DIAGNOSIS — K648 Other hemorrhoids: Secondary | ICD-10-CM | POA: Insufficient documentation

## 2020-09-18 DIAGNOSIS — D120 Benign neoplasm of cecum: Secondary | ICD-10-CM | POA: Diagnosis not present

## 2020-09-18 DIAGNOSIS — Z8719 Personal history of other diseases of the digestive system: Secondary | ICD-10-CM | POA: Diagnosis not present

## 2020-09-18 DIAGNOSIS — Z87891 Personal history of nicotine dependence: Secondary | ICD-10-CM | POA: Diagnosis not present

## 2020-09-18 DIAGNOSIS — K5730 Diverticulosis of large intestine without perforation or abscess without bleeding: Secondary | ICD-10-CM | POA: Diagnosis not present

## 2020-09-18 DIAGNOSIS — G4733 Obstructive sleep apnea (adult) (pediatric): Secondary | ICD-10-CM | POA: Diagnosis not present

## 2020-09-18 HISTORY — PX: COLONOSCOPY WITH PROPOFOL: SHX5780

## 2020-09-18 SURGERY — COLONOSCOPY WITH PROPOFOL
Anesthesia: General

## 2020-09-18 MED ORDER — GLYCOPYRROLATE 0.2 MG/ML IJ SOLN
INTRAMUSCULAR | Status: AC
  Filled 2020-09-18: qty 3

## 2020-09-18 MED ORDER — LIDOCAINE HCL (CARDIAC) PF 100 MG/5ML IV SOSY
PREFILLED_SYRINGE | INTRAVENOUS | Status: DC | PRN
  Administered 2020-09-18: 100 mg via INTRAVENOUS

## 2020-09-18 MED ORDER — PHENYLEPHRINE HCL (PRESSORS) 10 MG/ML IV SOLN
INTRAVENOUS | Status: DC | PRN
  Administered 2020-09-18: 50 ug via INTRAVENOUS
  Administered 2020-09-18 (×2): 100 ug via INTRAVENOUS

## 2020-09-18 MED ORDER — EPHEDRINE SULFATE 50 MG/ML IJ SOLN
INTRAMUSCULAR | Status: DC | PRN
  Administered 2020-09-18 (×2): 5 mg via INTRAVENOUS
  Administered 2020-09-18: 10 mg via INTRAVENOUS

## 2020-09-18 MED ORDER — SODIUM CHLORIDE 0.9 % IV SOLN: INTRAVENOUS | Status: DC

## 2020-09-18 MED ORDER — GLYCOPYRROLATE 0.2 MG/ML IJ SOLN
INTRAMUSCULAR | Status: DC | PRN
  Administered 2020-09-18 (×2): .2 mg via INTRAVENOUS

## 2020-09-18 MED ORDER — PROPOFOL 10 MG/ML IV BOLUS
INTRAVENOUS | Status: DC | PRN
  Administered 2020-09-18: 10 mg via INTRAVENOUS
  Administered 2020-09-18: 80 mg via INTRAVENOUS
  Administered 2020-09-18: 20 mg via INTRAVENOUS

## 2020-09-18 MED ORDER — PROPOFOL 500 MG/50ML IV EMUL
INTRAVENOUS | Status: DC | PRN
  Administered 2020-09-18: 140 ug/kg/min via INTRAVENOUS

## 2020-09-18 MED ORDER — EPHEDRINE 5 MG/ML INJ
INTRAVENOUS | Status: AC
  Filled 2020-09-18: qty 5

## 2020-09-18 NOTE — Anesthesia Preprocedure Evaluation (Signed)
Anesthesia Evaluation  Patient identified by MRN, date of birth, ID band Patient awake    Reviewed: Allergy & Precautions, NPO status , Patient's Chart, lab work & pertinent test results, reviewed documented beta blocker date and time   Airway Mallampati: III       Dental no notable dental hx.    Pulmonary neg pulmonary ROS, former smoker,    Pulmonary exam normal        Cardiovascular Exercise Tolerance: Good hypertension, Pt. on medications and Pt. on home beta blockers + CAD, + Cardiac Stents and + Peripheral Vascular Disease  Normal cardiovascular exam  ECHO 09/2017: MILD LV DYSFUNCTION (See above) WITH MILD LVH  NORMAL LA PRESSURES WITH NORMAL DIASTOLIC FUNCTION  NORMAL RIGHT VENTRICULAR SYSTOLIC FUNCTION  VALVULAR REGURGITATION: MILD MR, TRIVIAL PR, TRIVIAL TR  NO VALVULAR STENOSIS  LVEF 45-50%  3D acquisition and reconstructions were performed as part of this  examination to more accurately quantify the effects of reduced left  ventricular ejection fraction. (post-processing on an Independent  workstation).     Neuro/Psych negative neurological ROS  negative psych ROS   GI/Hepatic negative GI ROS, Neg liver ROS,   Endo/Other  negative endocrine ROS  Renal/GU Renal disease (history of Renal Cancer s/p nephrectomy 2010)     Musculoskeletal  (+) Arthritis , Osteoarthritis,    Abdominal (+) - obese,   Peds  Hematology negative hematology ROS (+)   Anesthesia Other Findings   Reproductive/Obstetrics                            Anesthesia Physical Anesthesia Plan  ASA: 3  Anesthesia Plan: General   Post-op Pain Management:    Induction: Intravenous  PONV Risk Score and Plan: 2 and Propofol infusion and TIVA  Airway Management Planned: Natural Airway and Nasal Cannula  Additional Equipment:   Intra-op Plan:   Post-operative Plan:   Informed Consent: I  have reviewed the patients History and Physical, chart, labs and discussed the procedure including the risks, benefits and alternatives for the proposed anesthesia with the patient or authorized representative who has indicated his/her understanding and acceptance.     Dental advisory given  Plan Discussed with: CRNA and Anesthesiologist  Anesthesia Plan Comments:        Anesthesia Quick Evaluation

## 2020-09-18 NOTE — Anesthesia Procedure Notes (Signed)
Date/Time: 09/18/2020 9:33 AM Performed by: Lily Peer, Mohmmad Saleeby, CRNA Pre-anesthesia Checklist: Patient identified, Emergency Drugs available, Timeout performed, Patient being monitored and Suction available Patient Re-evaluated:Patient Re-evaluated prior to induction Oxygen Delivery Method: Nasal cannula Induction Type: IV induction

## 2020-09-18 NOTE — Anesthesia Postprocedure Evaluation (Signed)
Anesthesia Post Note  Patient: Roberto Walker  Procedure(s) Performed: COLONOSCOPY WITH PROPOFOL  Patient location during evaluation: Endoscopy Anesthesia Type: General Level of consciousness: awake and alert Pain management: pain level controlled Vital Signs Assessment: post-procedure vital signs reviewed and stable Respiratory status: spontaneous breathing, nonlabored ventilation and respiratory function stable Cardiovascular status: blood pressure returned to baseline and stable Postop Assessment: no apparent nausea or vomiting Anesthetic complications: no   No notable events documented.   Last Vitals:  Vitals:   09/18/20 1007 09/18/20 1026  BP: 90/64 107/60  Pulse:    Resp:    Temp: (!) 36.1 C   SpO2:      Last Pain:  Vitals:   09/18/20 1043  TempSrc:   PainSc: 0-No pain                 [place] Ouch

## 2020-09-18 NOTE — Transfer of Care (Signed)
Immediate Anesthesia Transfer of Care Note  Patient: Roberto Walker  Procedure(s) Performed: COLONOSCOPY WITH PROPOFOL  Patient Location: Endoscopy Unit  Anesthesia Type:General  Level of Consciousness: drowsy  Airway & Oxygen Therapy: Patient Spontanous Breathing  Post-op Assessment: Report given to RN and Post -op Vital signs reviewed and stable  Post vital signs: Reviewed and stable  Last Vitals:  Vitals Value Taken Time  BP 90/67   Temp    Pulse 70   Resp 16   SpO2 96     Last Pain:  Vitals:   09/18/20 0903  TempSrc: Temporal  PainSc: 0-No pain         Complications: No notable events documented.

## 2020-09-18 NOTE — H&P (Signed)
Vonda Antigua, MD [address] [place], [place], [place], [address] [id number], [place], [place], [place], [address] Phone: [phone]  Fax: [phone]  Primary Care Physician:  Steele Sizer, MD   Pre-Procedure History & Physical: HPI:  Roberto Walker is a 60 y.o. male is here for a colonoscopy.   Past Medical History:  Diagnosis Date   Hypercalcemia    Hyperlipidemia    Hypertension    Metabolic syndrome    Microscopic hematuria    Osteoarthrosis    Peripheral vascular disease ([place])    Prostate cancer ([place])    Renal cell carcinoma ([place])    Vitamin D deficiency    Vitiligo     Past Surgical History:  Procedure Laterality Date   Beam Radiation     Prostate using Photons   ILIAC VEIN ANGIOPLASTY / STENTING     LEFT HEART CATH  01/11/2012   NEPHRECTOMY Right 2010   Partial    Prior to Admission medications   Medication Sig Start Date End Date Taking? Authorizing Provider  amLODipine-olmesartan (AZOR) 10-40 MG tablet Take 1 tablet by mouth daily. 01/11/20  Yes Steele Sizer, MD  aspirin 81 MG tablet Take 1 tablet by mouth daily. 10/19/08  Yes [provider]  atorvastatin (LIPITOR) 80 MG tablet Take 1 tablet (80 mg total) by mouth daily. 01/20/18  Yes Sowles, Drue Stager, MD  carvedilol (COREG) 12.5 MG tablet Take 1 tablet (12.5 mg total) by mouth 2 (two) times daily. 01/20/18  Yes Sowles, Drue Stager, MD  cholecalciferol (VITAMIN D) 1000 UNITS tablet Take 1 tablet by mouth daily. 01/16/09  Yes [provider]  clopidogrel (PLAVIX) 75 MG tablet Take 1 tablet (75 mg total) by mouth daily. 01/20/18  Yes Sowles, Drue Stager, MD  nitroGLYCERIN (NITROSTAT) 0.4 MG SL tablet Place 1 tablet (0.4 mg total) under the tongue daily. 07/28/18  Yes Sowles, Drue Stager, MD  sildenafil (VIAGRA) 50 MG tablet Take by mouth. 12/22/19  Yes [provider]  COVID-19 mRNA Vac-TriS, Pfizer, (PFIZER-BIONT COVID-19 VAC-TRIS) SUSP injection Inject into the  muscle. Patient not taking: Reported on 09/18/2020 06/22/20   Carlyle Basques, MD    Allergies as of 08/29/2020 - Review Complete 08/29/2020  Allergen Reaction Noted   Lisinopril Swelling 08/04/2014    Family History  Problem Relation Age of Onset   Hypertension Mother    Lung cancer Father    Hypertension Father    Hyperlipidemia Father    Diabetes Sister        oldest sister   Hypertension Sister    Hypertension Sister     Social History   Socioeconomic History   Marital status: Married    Spouse name: Mateo Flow   Number of children: 2   Years of education: College    Highest education level: Not on file  Occupational History   Occupation: RDU  Tobacco Use   Smoking status: Former    Packs/day: 0.50    Years: 20.00    Pack years: 10.00    Types: Cigarettes    Quit date: 01/12/2012    Years since quitting: 8.6   Smokeless tobacco: Never  Vaping Use   Vaping Use: Never used  Substance and Sexual Activity   Alcohol use: Yes    Alcohol/week: 2.0 standard drinks    Types: 2 Shots of liquor per week    Comment: Minimal alcohol consumption   Drug use: No   Sexual activity: Yes    Partners: Female  Other Topics Concern  Not on file  Social History Narrative   Not on file   Social Determinants of Health   Financial Resource Strain: Low Risk    Difficulty of Paying Living Expenses: Not hard at all  Food Insecurity: No Food Insecurity   Worried About Charity fundraiser in the Last Year: Never true   Arboriculturist in the Last Year: Never true  Transportation Needs: No Transportation Needs   Lack of Transportation (Medical): No   Lack of Transportation (Non-Medical): No  Physical Activity: Sufficiently Active   Days of Exercise per Week: 5 days   Minutes of Exercise per Session: 60 min  Stress: No Stress Concern Present   Feeling of Stress : Not at all  Social Connections: Unknown   Frequency of Communication with Friends and Family: Three times a week    Frequency of Social Gatherings with Friends and Family: More than three times a week   Attends Religious Services: More than 4 times per year   Active Member of Clubs or Organizations: Patient refused   Attends Music therapist: Patient refused   Marital Status: Married  Human resources officer Violence: Not At Risk   Fear of Current or Ex-Partner: No   Emotionally Abused: No   Physically Abused: No   Sexually Abused: No    Review of Systems: See HPI, otherwise negative ROS  Physical Exam: Constitutional: General:   Alert,  Well-developed, well-nourished, pleasant and cooperative in NAD BP 119/80   Pulse (!) 48   Temp (!) 96.6 F (35.9 C) (Temporal)   Resp 18   Ht 6' (1.829 m)   Wt 89.8 kg   SpO2 100%   BMI 26.85 kg/m   Head: Normocephalic, atraumatic.   Eyes:  Sclera clear, no icterus.   Conjunctiva pink.   Mouth:  No deformity or lesions, oropharynx pink & moist.  Neck:  Supple, trachea midline  Respiratory: Normal respiratory effort  Gastrointestinal:  Soft, non-tender and non-distended without masses, hepatosplenomegaly or hernias noted.  No guarding or rebound tenderness.     Cardiac: No clubbing or edema.  No cyanosis. Normal posterior tibial pedal pulses noted.  Lymphatic:  No significant cervical adenopathy.  Psych:  Alert and cooperative. Normal mood and affect.  Musculoskeletal:   Symmetrical without gross deformities. 5/5 Lower extremity strength bilaterally.  Skin: Warm. Intact without significant lesions or rashes. No jaundice.  Neurologic:  Face symmetrical, tongue midline, Normal sensation to touch;  grossly normal neurologically.  Psych:  Alert and oriented x3, Alert and cooperative. Normal mood and affect.  Impression/Plan: Roberto Walker is here for a colonoscopy to be performed for history of polyps. Last colonoscopy in 2010 with Dr. Allen Norris done for chronic diarrhea with 2 subcentimeter polyps removed. Pathology report not available.  Diverticulosis reported.   Risks, benefits, limitations, and alternatives regarding  colonoscopy have been reviewed with the patient.  Questions have been answered.  All parties agreeable.   Virgel Manifold, MD  09/18/2020, 9:24 AM

## 2020-09-18 NOTE — Op Note (Signed)
[facility] Patient Name: Roberto Walker Procedure Date: 09/18/2020 9:31 AM MRN: [id number] Account #: [id number] Date of Birth: Feb 26, 1960 Admit Type: Outpatient Age: 60 Room: [facility] ENDO ROOM 3 Gender: Male Note Status: Finalized Procedure:             Colonoscopy Indications:           High risk colon cancer surveillance: Personal history                         of colonic polyps Providers:             Carmelite Violet B. Bonna Gains MD, MD Referring MD:          Bethena Roys. Sowles, MD (Referring MD) Medicines:             Monitored Anesthesia Care Complications:         No immediate complications. Procedure:             Pre-Anesthesia Assessment:                        - ASA Grade Assessment: II - A patient with mild                         systemic disease.                        - Prior to the procedure, a History and Physical was                         performed, and patient medications, allergies and                         sensitivities were reviewed. The patient's tolerance                         of previous anesthesia was reviewed.                        - The risks and benefits of the procedure and the                         sedation options and risks were discussed with the                         patient. All questions were answered and informed                         consent was obtained.                        - Patient identification and proposed procedure were                         verified prior to the procedure by the physician, the                         nurse, the anesthesiologist, the anesthetist and the                         technician. The procedure  was verified in the                         procedure room.                        After obtaining informed consent, the colonoscope was                         passed under direct vision. Throughout the procedure,                         the patient's blood pressure, pulse, and  oxygen                         saturations were monitored continuously. The                         Colonoscope was introduced through the anus and                         advanced to the the cecum, identified by appendiceal                         orifice and ileocecal valve. The colonoscopy was                         performed with ease. The patient tolerated the                         procedure well. The quality of the bowel preparation                         was fair. Findings:      The perianal and digital rectal examinations were normal.      A 4 mm polyp was found in the cecum. The polyp was sessile. The polyp       was removed with a jumbo cold forceps. Resection and retrieval were       complete.      A 5 mm polyp was found in the ileocecal valve. The polyp was sessile.       The polyp was removed with a jumbo cold forceps. Resection and retrieval       were complete.      A 5 mm polyp was found in the transverse colon. The polyp was sessile.       The polyp was removed with a jumbo cold forceps. Resection and retrieval       were complete.      A few diverticula were found in the ascending colon.      Multiple diverticula were found in the sigmoid colon.      The exam was otherwise without abnormality.      The rectum, sigmoid colon, descending colon, transverse colon, ascending       colon and cecum appeared normal.      Non-bleeding internal hemorrhoids were found during retroflexion.      No additional abnormalities were found on retroflexion. Impression:            - Preparation of the colon was fair.                        -  One 4 mm polyp in the cecum, removed with a jumbo                         cold forceps. Resected and retrieved.                        - One 5 mm polyp at the ileocecal valve, removed with                         a jumbo cold forceps. Resected and retrieved.                        - One 5 mm polyp in the transverse colon, removed with                          a jumbo cold forceps. Resected and retrieved.                        - Diverticulosis in the ascending colon.                        - Diverticulosis in the sigmoid colon.                        - The examination was otherwise normal.                        - The rectum, sigmoid colon, descending colon,                         transverse colon, ascending colon and cecum are normal.                        - Non-bleeding internal hemorrhoids. Recommendation:        - Await pathology results.                        - Discharge patient to home (with escort).                        - High fiber diet.                        - Advance diet as tolerated.                        - Continue present medications.                        - Repeat colonoscopy in 6-12 months, with a 2 day                         prep. because the bowel preparation was suboptimal.                        - The findings and recommendations were discussed with                         the patient.                        -  The findings and recommendations were discussed with                         the patient's family.                        - Return to primary care physician as previously                         scheduled. Procedure Code(s):     --- Professional ---                        [phone], Colonoscopy, flexible; with biopsy, single or                         multiple Diagnosis Code(s):     --- Professional ---                        Z86.010, Personal history of colonic polyps                        K63.5, Polyp of colon CPT copyright 2019 American Medical Association. All rights reserved. The codes documented in this report are preliminary and upon coder review may  be revised to meet current compliance requirements.  Vonda Antigua, MD Margretta Sidle B. Bonna Gains MD, MD 09/18/2020 10:14:18 AM This report has been signed electronically. Number of Addenda: 0 Note Initiated On: 09/18/2020 9:31 AM Scope  Withdrawal Time: 0 hours 15 minutes 55 seconds  Total Procedure Duration: 0 hours 24 minutes 55 seconds  Estimated Blood Loss:  Estimated blood loss: none.      [facility]

## 2020-09-19 ENCOUNTER — Encounter: Payer: Self-pay | Admitting: Gastroenterology

## 2020-09-19 LAB — SURGICAL PATHOLOGY

## 2020-09-21 ENCOUNTER — Encounter: Payer: Self-pay | Admitting: Cardiology

## 2020-09-21 ENCOUNTER — Other Ambulatory Visit: Payer: Self-pay

## 2020-09-21 ENCOUNTER — Ambulatory Visit (INDEPENDENT_AMBULATORY_CARE_PROVIDER_SITE_OTHER): Payer: Federal, State, Local not specified - PPO | Admitting: Cardiology

## 2020-09-21 VITALS — BP 124/76 | HR 56 | Ht 72.0 in | Wt 202.0 lb

## 2020-09-21 DIAGNOSIS — I739 Peripheral vascular disease, unspecified: Secondary | ICD-10-CM | POA: Diagnosis not present

## 2020-09-21 DIAGNOSIS — E7800 Pure hypercholesterolemia, unspecified: Secondary | ICD-10-CM

## 2020-09-21 DIAGNOSIS — I10 Essential (primary) hypertension: Secondary | ICD-10-CM | POA: Diagnosis not present

## 2020-09-21 DIAGNOSIS — I2510 Atherosclerotic heart disease of native coronary artery without angina pectoris: Secondary | ICD-10-CM

## 2020-09-21 NOTE — Progress Notes (Signed)
Cardiology Office Note:    Date:  09/21/2020   ID:  Roberto Walker, DOB July 07, 1960, MRN [id number]  PCP:  Steele Sizer, MD   [facility] Providers Cardiologist:  None     Referring MD: Steele Sizer, MD   Chief Complaint  Patient presents with   New Patient (Initial Visit)    Referred by Dr. Ancil Boozer for Arteriosclerosis, HTN, & Aneurysm R iliac. Meds reviewed verbally with patient.     History of Present Illness:    Roberto Walker is a 60 y.o. male with a hx of CAD/MI (s/p PCI to LAD 2013, RCA 80%)hypertension, hyperlipidemia, PAD (s/p PCI 2015, right common iliac artery aneurysm), former smoker x20+ years who presents to Establish care.    Patient previously followed at [facility] for cardiac care.  Has a history of MI, underwent drug-eluting stent to mid LAD 2013.  He feels well, takes aspirin, Plavix, statin as prescribed.  Denies chest pain or shortness of breath.  No symptoms of concern at this time.  Patient has a history of hematuria, underwent an abdominal CT 09/26/2019 showing aortic atherosclerosis, finding of aneurysmal dilation of the right common iliac artery up to 1.7 cm noted.  He has a history of right iliac artery aneurysm, being followed by vascular surgery.  Past Medical History:  Diagnosis Date   Hypercalcemia    Hyperlipidemia    Hypertension    Metabolic syndrome    Microscopic hematuria    Osteoarthrosis    Peripheral vascular disease ([place])    Prostate cancer ([place])    Renal cell carcinoma ([place])    Vitamin D deficiency    Vitiligo     Past Surgical History:  Procedure Laterality Date   Beam Radiation     Prostate using Photons   COLONOSCOPY WITH PROPOFOL N/A 09/18/2020   Procedure: COLONOSCOPY WITH PROPOFOL;  Surgeon: Virgel Manifold, MD;  Location: [facility] ENDOSCOPY;  Service: Endoscopy;  Laterality: N/A;   ILIAC VEIN ANGIOPLASTY / STENTING     LEFT HEART CATH  01/11/2012   NEPHRECTOMY Right 2010   Partial    Current  Medications: Current Meds  Medication Sig   amLODipine-olmesartan (AZOR) 10-40 MG tablet Take 1 tablet by mouth daily.   aspirin 81 MG tablet Take 1 tablet by mouth daily.   atorvastatin (LIPITOR) 80 MG tablet Take 1 tablet (80 mg total) by mouth daily.   carvedilol (COREG) 12.5 MG tablet Take 1 tablet (12.5 mg total) by mouth 2 (two) times daily.   cholecalciferol (VITAMIN D) 1000 UNITS tablet Take 1 tablet by mouth daily.   clopidogrel (PLAVIX) 75 MG tablet Take 1 tablet (75 mg total) by mouth daily.   COVID-19 mRNA Vac-TriS, Pfizer, (PFIZER-BIONT COVID-19 VAC-TRIS) SUSP injection Inject into the muscle.   nitroGLYCERIN (NITROSTAT) 0.4 MG SL tablet Place 1 tablet (0.4 mg total) under the tongue daily.   sildenafil (VIAGRA) 50 MG tablet Take by mouth.     Allergies:   Lisinopril   Social History   Socioeconomic History   Marital status: Married    Spouse name: Mateo Flow   Number of children: 2   Years of education: College    Highest education level: Not on file  Occupational History   Occupation: RDU  Tobacco Use   Smoking status: Former    Packs/day: 0.50    Years: 20.00    Pack years: 10.00    Types: Cigarettes    Quit date: 01/12/2012    Years since quitting: 8.6  Smokeless tobacco: Never  Vaping Use   Vaping Use: Never used  Substance and Sexual Activity   Alcohol use: Yes    Alcohol/week: 2.0 standard drinks    Types: 2 Shots of liquor per week    Comment: Minimal alcohol consumption   Drug use: No   Sexual activity: Yes    Partners: Female  Other Topics Concern   Not on file  Social History Narrative   Not on file   Social Determinants of Health   Financial Resource Strain: Low Risk    Difficulty of Paying Living Expenses: Not hard at all  Food Insecurity: No Food Insecurity   Worried About Charity fundraiser in the Last Year: Never true   [place] in the Last Year: Never true  Transportation Needs: No Transportation Needs   Lack of  Transportation (Medical): No   Lack of Transportation (Non-Medical): No  Physical Activity: Sufficiently Active   Days of Exercise per Week: 5 days   Minutes of Exercise per Session: 60 min  Stress: No Stress Concern Present   Feeling of Stress : Not at all  Social Connections: Unknown   Frequency of Communication with Friends and Family: Three times a week   Frequency of Social Gatherings with Friends and Family: More than three times a week   Attends Religious Services: More than 4 times per year   Active Member of Clubs or Organizations: Patient refused   Attends Archivist Meetings: Patient refused   Marital Status: Married     Family History: The patient's family history includes Diabetes in his sister; Hyperlipidemia in his father; Hypertension in his father, mother, sister, and sister; Lung cancer in his father.  ROS:   Please see the history of present illness.     All other systems reviewed and are negative.  EKGs/Labs/Other Studies Reviewed:    The following studies were reviewed today:   EKG:  EKG is  ordered today.  The ekg ordered today demonstrates sinus bradycardia, nonspecific ST-T changes  Recent Labs: 07/31/2020: ALT 32; BUN 16; Creat 1.04; Hemoglobin 14.3; Platelets 286; Potassium 4.1; Sodium 138  Recent Lipid Panel    Component Value Date/Time   CHOL 126 07/31/2020 1050   CHOL 134 02/16/2015 0820   CHOL 118 05/02/2011 0943   TRIG 74 07/31/2020 1050   TRIG 39 05/02/2011 0943   HDL 56 07/31/2020 1050   HDL 48 02/16/2015 0820   HDL 52 05/02/2011 0943   CHOLHDL 2.3 07/31/2020 1050   VLDL 9 06/06/2016 0804   VLDL 8 05/02/2011 0943   LDLCALC 55 07/31/2020 1050   LDLCALC 58 05/02/2011 0943     Risk Assessment/Calculations:          Physical Exam:    VS:  BP 124/76 (BP Location: Left Arm, Patient Position: Sitting, Cuff Size: Normal)   Pulse (!) 56   Ht 6' (1.829 m)   Wt 202 lb (91.6 kg)   SpO2 98%   BMI 27.40 kg/m     Wt Readings  from Last 3 Encounters:  09/21/20 202 lb (91.6 kg)  09/18/20 198 lb (89.8 kg)  07/31/20 202 lb (91.6 kg)     GEN:  Well nourished, well developed in no acute distress HEENT: Normal NECK: No JVD; No carotid bruits LYMPHATICS: No lymphadenopathy CARDIAC: RRR, no murmurs, rubs, gallops RESPIRATORY:  Clear to auscultation without rales, wheezing or rhonchi  ABDOMEN: Soft, non-tender, non-distended MUSCULOSKELETAL:  No edema; No deformity  SKIN:  Warm and dry NEUROLOGIC:  Alert and oriented x 3 PSYCHIATRIC:  Normal affect   ASSESSMENT:    1. Coronary artery disease involving native coronary artery of native heart without angina pectoris   2. Primary hypertension   3. Pure hypercholesterolemia   4. PAD (peripheral artery disease) (HCC)    PLAN:    In order of problems listed above:  CAD/MI 2013 PCI to mid LAD.  80% mid RCA.  Denies chest pain.  Continue aspirin, Plavix, Lipitor.  LDL at goal.  Get echocardiogram to evaluate baseline EF. Hypertension, BP controlled.  Continue current meds. Hyperlipidemia, LDL at goal.  Continue high intensity statin. PAD, status post stents, common iliac aneurysm.  Follows up with vascular surgery.  Keep follow-up appointments.  Follow-up in 3 months after echocardiogram.     Medication Adjustments/Labs and Tests Ordered: Current medicines are reviewed at length with the patient today.  Concerns regarding medicines are outlined above.  Orders Placed This Encounter  Procedures   EKG 12-Lead   ECHOCARDIOGRAM COMPLETE   No orders of the defined types were placed in this encounter.   Patient Instructions  Medication Instructions:   Your physician recommends that you continue on your current medications as directed. Please refer to the Current Medication list given to you today.  *If you need a refill on your cardiac medications before your next appointment, please call your pharmacy*   Lab Work: None ordered If you have labs (blood work)  drawn today and your tests are completely normal, you will receive your results only by: [place] (if you have MyChart) OR A paper copy in the mail If you have any lab test that is abnormal or we need to change your treatment, we will call you to review the results.   Testing/Procedures:  Your physician has requested that you have an echocardiogram. Echocardiography is a painless test that uses sound waves to create images of your heart. It provides your doctor with information about the size and shape of your heart and how well your heart's chambers and valves are working. This procedure takes approximately one hour. There are no restrictions for this procedure.    Follow-Up: At [facility], you and your health needs are our priority.  As part of our continuing mission to provide you with exceptional heart care, we have created designated Provider Care Teams.  These Care Teams include your primary Cardiologist (physician) and Advanced Practice Providers (APPs -  Physician Assistants and Nurse Practitioners) who all work together to provide you with the care you need, when you need it.  We recommend signing up for the patient portal called "MyChart".  Sign up information is provided on this After Visit Summary.  MyChart is used to connect with patients for Virtual Visits (Telemedicine).  Patients are able to view lab/test results, encounter notes, upcoming appointments, etc.  Non-urgent messages can be sent to your provider as well.   To learn more about what you can do with MyChart, go to [email or web address].    Your next appointment:   3 month(s)  The format for your next appointment:   In Person  Provider:   Kate Sable, MD   Other Instructions    Signed, Kate Sable, MD  09/21/2020 3:37 PM    [place]

## 2020-09-21 NOTE — Patient Instructions (Signed)
Medication Instructions:   Your physician recommends that you continue on your current medications as directed. Please refer to the Current Medication list given to you today.  *If you need a refill on your cardiac medications before your next appointment, please call your pharmacy*   Lab Work: None ordered If you have labs (blood work) drawn today and your tests are completely normal, you will receive your results only by: [place] (if you have MyChart) OR A paper copy in the mail If you have any lab test that is abnormal or we need to change your treatment, we will call you to review the results.   Testing/Procedures:  Your physician has requested that you have an echocardiogram. Echocardiography is a painless test that uses sound waves to create images of your heart. It provides your doctor with information about the size and shape of your heart and how well your heart's chambers and valves are working. This procedure takes approximately one hour. There are no restrictions for this procedure.    Follow-Up: At [facility], you and your health needs are our priority.  As part of our continuing mission to provide you with exceptional heart care, we have created designated Provider Care Teams.  These Care Teams include your primary Cardiologist (physician) and Advanced Practice Providers (APPs -  Physician Assistants and Nurse Practitioners) who all work together to provide you with the care you need, when you need it.  We recommend signing up for the patient portal called "MyChart".  Sign up information is provided on this After Visit Summary.  MyChart is used to connect with patients for Virtual Visits (Telemedicine).  Patients are able to view lab/test results, encounter notes, upcoming appointments, etc.  Non-urgent messages can be sent to your provider as well.   To learn more about what you can do with MyChart, go to [email or web address].    Your next appointment:   3  month(s)  The format for your next appointment:   In Person  Provider:   Kate Sable, MD   Other Instructions

## 2020-09-24 ENCOUNTER — Telehealth (INDEPENDENT_AMBULATORY_CARE_PROVIDER_SITE_OTHER): Payer: Self-pay | Admitting: Vascular Surgery

## 2020-09-24 NOTE — Telephone Encounter (Signed)
Patient called in stating he is having pain left leg, which started about 3 weeks ago.  Patient is wanting to see if he can move his appointment sooner to check to be sure everything is okay.  Please advise.

## 2020-09-24 NOTE — Telephone Encounter (Signed)
That's fine, we can move it up

## 2020-09-25 NOTE — Telephone Encounter (Signed)
Patient scheduled for 8/31 8:30 am

## 2020-10-02 ENCOUNTER — Other Ambulatory Visit (INDEPENDENT_AMBULATORY_CARE_PROVIDER_SITE_OTHER): Payer: Self-pay | Admitting: Nurse Practitioner

## 2020-10-02 DIAGNOSIS — M79605 Pain in left leg: Secondary | ICD-10-CM

## 2020-10-02 DIAGNOSIS — I739 Peripheral vascular disease, unspecified: Secondary | ICD-10-CM

## 2020-10-03 ENCOUNTER — Ambulatory Visit (INDEPENDENT_AMBULATORY_CARE_PROVIDER_SITE_OTHER): Payer: Federal, State, Local not specified - PPO

## 2020-10-03 ENCOUNTER — Encounter (INDEPENDENT_AMBULATORY_CARE_PROVIDER_SITE_OTHER): Payer: Self-pay | Admitting: Nurse Practitioner

## 2020-10-03 ENCOUNTER — Other Ambulatory Visit: Payer: Self-pay

## 2020-10-03 ENCOUNTER — Ambulatory Visit (INDEPENDENT_AMBULATORY_CARE_PROVIDER_SITE_OTHER): Payer: Federal, State, Local not specified - PPO | Admitting: Nurse Practitioner

## 2020-10-03 VITALS — BP 122/80 | HR 48 | Ht 72.0 in | Wt 202.0 lb

## 2020-10-03 DIAGNOSIS — I739 Peripheral vascular disease, unspecified: Secondary | ICD-10-CM

## 2020-10-03 DIAGNOSIS — M79605 Pain in left leg: Secondary | ICD-10-CM

## 2020-10-03 DIAGNOSIS — I10 Essential (primary) hypertension: Secondary | ICD-10-CM | POA: Diagnosis not present

## 2020-10-03 DIAGNOSIS — E785 Hyperlipidemia, unspecified: Secondary | ICD-10-CM

## 2020-10-03 DIAGNOSIS — R0989 Other specified symptoms and signs involving the circulatory and respiratory systems: Secondary | ICD-10-CM | POA: Diagnosis not present

## 2020-10-03 NOTE — Progress Notes (Signed)
Subjective:    Patient ID: Roberto Walker, male    DOB: August 14, 1960, 60 y.o.   MRN: [id number] Chief Complaint  Patient presents with  . Follow-up    1 yr ABI    Roberto Walker is a 60 year old male that presents today as a early follow-up due to having worsening claudication symptoms in his left lower extremity.  The patient's most recent intervention was in 2015 and since that time he had been doing well.  The patient notes that for the last 3 weeks or so he has been experiencing weakness and numbness in his left leg after walking for about 10 or so minutes.  The patient notes that once he stops and rest for several minutes he is able to continue.  However these worsening claudication-like symptoms are having a negative effect on his life and daily activities.  He denies any rest pain.  He has no wounds or ulcerations.  It was also incidentally noted that the patient had a greater than 20 point discrepancy in his upper extremity blood pressures.  This is not consistent with his studies done last year.  He does note some weakness and fatigue in his left upper extremity with activity but no dizziness is noted.  Today noninvasive studies show an ABI of 1.16 on the right and 0.82 on the left.  The patient has triphasic tibial artery waveforms on the right with monophasic on the left.  The right toe waveforms are strong with dampened toe waveforms on the left.  Review of Systems  Cardiovascular:        Claudication  Skin:  Negative for wound.  All other systems reviewed and are negative.     Objective:   Physical Exam Vitals reviewed.  HENT:     Head: Normocephalic.  Cardiovascular:     Rate and Rhythm: Normal rate.     Pulses: Decreased pulses.          Radial pulses are 2+ on the right side and 2+ on the left side.  Pulmonary:     Effort: Pulmonary effort is normal.  Neurological:     Mental Status: He is alert and oriented to person, place, and time.  Psychiatric:        Mood and  Affect: Mood normal.        Behavior: Behavior normal.        Thought Content: Thought content normal.        Judgment: Judgment normal.    BP 122/80   Pulse (!) 48   Ht 6' (1.829 m)   Wt 202 lb (91.6 kg)   BMI 27.40 kg/m   Past Medical History:  Diagnosis Date  . Hypercalcemia   . Hyperlipidemia   . Hypertension   . Metabolic syndrome   . Microscopic hematuria   . Osteoarthrosis   . Peripheral vascular disease ([place])   . Prostate cancer ([place])   . Renal cell carcinoma ([place])   . Vitamin D deficiency   . Vitiligo     Social History   Socioeconomic History  . Marital status: Married    Spouse name: Mateo Flow  . Number of children: 2  . Years of education: College   . Highest education level: Not on file  Occupational History  . 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 Smoking status: Former    Packs/day: 0.50    Years: 20.00    Pack years: 10.00    Types: Cigarettes    Quit date: 01/12/2012  Years since quitting: 8.7  . Smokeless tobacco: Never  Vaping Use  . Vaping Use: Never used  Substance and Sexual Activity  . Alcohol use: Yes    Alcohol/week: 2.0 standard drinks    Types: 2 Shots of liquor per week    Comment: Minimal alcohol consumption  . Drug use: No  . Sexual activity: Yes    Partners: Female  Other Topics Concern  . Not on file  Social History Narrative  . Not on file   Social Determinants of Health   Financial Resource Strain: Low Risk   . Difficulty of Paying Living Expenses: Not hard at all  Food Insecurity: No Food Insecurity  . Worried About Charity fundraiser in the Last Year: Never true  . Ran Out of Food in the Last Year: Never true  Transportation Needs: No Transportation Needs  . Lack of Transportation (Medical): No  . Lack of Transportation (Non-Medical): No  Physical Activity: Sufficiently Active  . Days of Exercise per Week: 5 days  . Minutes of Exercise per Session: 60 min  Stress: No Stress Concern Present  . Feeling of Stress :  Not at all  Social Connections: Unknown  . Frequency of Communication with Friends and Family: Three times a week  . Frequency of Social Gatherings with Friends and Family: More than three times a week  . Attends Religious Services: More than 4 times per year  . Active Member of Clubs or Organizations: Patient refused  . Attends Archivist Meetings: Patient refused  . Marital Status: Married  Human resources officer Violence: Not At Risk  . Fear of Current or Ex-Partner: No  . Emotionally Abused: No  . Physically Abused: No  . Sexually Abused: No    Past Surgical History:  Procedure Laterality Date  . Beam Radiation     Prostate using Photons  . COLONOSCOPY WITH PROPOFOL N/A 09/18/2020   Procedure: COLONOSCOPY WITH PROPOFOL;  Surgeon: Virgel Manifold, MD;  Location: [facility] ENDOSCOPY;  Service: Endoscopy;  Laterality: N/A;  . ILIAC VEIN ANGIOPLASTY / STENTING    . LEFT HEART CATH  01/11/2012  . NEPHRECTOMY Right 2010   Partial    Family History  Problem Relation Age of Onset  . Hypertension Mother   . Lung cancer Father   . Hypertension Father   . Hyperlipidemia Father   . Diabetes Sister        oldest sister  . Hypertension Sister   . Hypertension Sister     Allergies  Allergen Reactions  . Lisinopril Swelling    Swelling around mouth, lips    CBC Latest Ref Rng & Units 07/31/2020 07/28/2019 07/28/2018  WBC 3.8 - 10.8 Thousand/uL 9.1 6.8 7.3  Hemoglobin 13.2 - 17.1 g/dL 14.3 13.5 13.4  Hematocrit 38.5 - 50.0 % 41.1 39.5 39.9  Platelets 140 - 400 Thousand/uL 286 263 264      CMP     Component Value Date/Time   NA 138 07/31/2020 1050   NA 141 08/02/2015 0853   NA 139 05/17/2013 0447   K 4.1 07/31/2020 1050   K 4.0 05/17/2013 0447   CL 110 07/31/2020 1050   CL 109 (H) 05/17/2013 0447   CO2 20 07/31/2020 1050   CO2 23 05/17/2013 0447   GLUCOSE 97 07/31/2020 1050   GLUCOSE 150 (H) 05/17/2013 0447   BUN 16 07/31/2020 1050   BUN 15 08/02/2015 0853    BUN 19 (H) 05/17/2013 0447   CREATININE 1.04 07/31/2020  1050   CALCIUM 10.1 07/31/2020 1050   CALCIUM 9.1 05/17/2013 0447   PROT 7.3 07/31/2020 1050   PROT 7.2 08/02/2015 0853   PROT 7.5 05/02/2011 0943   ALBUMIN 3.9 06/06/2016 0804   ALBUMIN 4.2 08/02/2015 0853   ALBUMIN 3.9 05/02/2011 0943   AST 25 07/31/2020 1050   AST 31 05/02/2011 0943   ALT 32 07/31/2020 1050   ALT 37 05/02/2011 0943   ALKPHOS 52 06/06/2016 0804   ALKPHOS 54 05/02/2011 0943   BILITOT 0.5 07/31/2020 1050   BILITOT 0.3 08/02/2015 0853   BILITOT 0.5 05/02/2011 0943   GFRNONAA 78 07/31/2020 1050   GFRAA 90 07/31/2020 1050     No results found.     Assessment & Plan:   1. PVD (peripheral vascular disease) ([place]) Recommend:  The patient has experienced increased symptoms and is now describing lifestyle limiting claudication and mild rest pain.   Given the severity of the patient's lower extremity symptoms the patient should undergo angiography and intervention.  Risk and benefits were reviewed the patient.  Indications for the procedure were reviewed.  All questions were answered, the patient agrees to proceed.   The patient should continue walking and begin a more formal exercise program.  The patient should continue antiplatelet therapy and aggressive treatment of the lipid abnormalities  The patient will follow up with me after the angiogram.    2. Benign essential HTN Continue antihypertensive medications as already ordered, these medications have been reviewed and there are no changes at this time.   3. Dyslipidemia Continue statin as ordered and reviewed, no changes at this time   4. Unequal blood pressure in upper extremities It was noted today that the patient had a discrepancy between blood pressures in his upper extremities.  We will evaluate for possible subclavian stenosis when the patient follows up with ABIs postprocedure. - VAS US CAROTID; Future   Current Outpatient Medications on  File Prior to Visit  Medication Sig Dispense Refill  . amLODipine-olmesartan (AZOR) 10-40 MG tablet Take 1 tablet by mouth daily. 90 tablet 1  . aspirin 81 MG tablet Take 1 tablet by mouth daily.    Marland Kitchen atorvastatin (LIPITOR) 80 MG tablet Take 1 tablet (80 mg total) by mouth daily. 90 tablet 1  . carvedilol (COREG) 12.5 MG tablet Take 1 tablet (12.5 mg total) by mouth 2 (two) times daily. 180 tablet 1  . cholecalciferol (VITAMIN D) 1000 UNITS tablet Take 1 tablet by mouth daily.    . clopidogrel (PLAVIX) 75 MG tablet Take 1 tablet (75 mg total) by mouth daily. 90 tablet 1  . COVID-19 mRNA Vac-TriS, Pfizer, (PFIZER-BIONT COVID-19 VAC-TRIS) SUSP injection Inject into the muscle. 0.3 mL 0  . nitroGLYCERIN (NITROSTAT) 0.4 MG SL tablet Place 1 tablet (0.4 mg total) under the tongue daily. 30 tablet 0  . sildenafil (VIAGRA) 50 MG tablet Take by mouth.     No current facility-administered medications on file prior to visit.    There are no Patient Instructions on file for this visit. No follow-ups on file.   Kris Hartmann, NP

## 2020-10-03 NOTE — H&P (View-Only) (Signed)
Subjective:    Patient ID: Roberto Walker, male    DOB: 1960/04/21, 60 y.o.   MRN: [id number] Chief Complaint  Patient presents with  . Follow-up    1 yr ABI    Roberto Walker is a 60 year old male that presents today as a early follow-up due to having worsening claudication symptoms in his left lower extremity.  The patient's most recent intervention was in 2015 and since that time he had been doing well.  The patient notes that for the last 3 weeks or so he has been experiencing weakness and numbness in his left leg after walking for about 10 or so minutes.  The patient notes that once he stops and rest for several minutes he is able to continue.  However these worsening claudication-like symptoms are having a negative effect on his life and daily activities.  He denies any rest pain.  He has no wounds or ulcerations.  It was also incidentally noted that the patient had a greater than 20 point discrepancy in his upper extremity blood pressures.  This is not consistent with his studies done last year.  He does note some weakness and fatigue in his left upper extremity with activity but no dizziness is noted.  Today noninvasive studies show an ABI of 1.16 on the right and 0.82 on the left.  The patient has triphasic tibial artery waveforms on the right with monophasic on the left.  The right toe waveforms are strong with dampened toe waveforms on the left.  Review of Systems  Cardiovascular:        Claudication  Skin:  Negative for wound.  All other systems reviewed and are negative.     Objective:   Physical Exam Vitals reviewed.  HENT:     Head: Normocephalic.  Cardiovascular:     Rate and Rhythm: Normal rate.     Pulses: Decreased pulses.          Radial pulses are 2+ on the right side and 2+ on the left side.  Pulmonary:     Effort: Pulmonary effort is normal.  Neurological:     Mental Status: He is alert and oriented to person, place, and time.  Psychiatric:        Mood and  Affect: Mood normal.        Behavior: Behavior normal.        Thought Content: Thought content normal.        Judgment: Judgment normal.    BP 122/80   Pulse (!) 48   Ht 6' (1.829 m)   Wt 202 lb (91.6 kg)   BMI 27.40 kg/m   Past Medical History:  Diagnosis Date  . Hypercalcemia   . Hyperlipidemia   . Hypertension   . Metabolic syndrome   . Microscopic hematuria   . Osteoarthrosis   . Peripheral vascular disease ([place])   . Prostate cancer ([place])   . Renal cell carcinoma ([place])   . Vitamin D deficiency   . Vitiligo     Social History   Socioeconomic History  . Marital status: Married    Spouse name: Mateo Flow  . Number of children: 2  . Years of education: College   . Highest education level: Not on file  Occupational History  . 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 Smoking status: Former    Packs/day: 0.50    Years: 20.00    Pack years: 10.00    Types: Cigarettes    Quit date: 01/12/2012  Years since quitting: 8.7  . Smokeless tobacco: Never  Vaping Use  . Vaping Use: Never used  Substance and Sexual Activity  . Alcohol use: Yes    Alcohol/week: 2.0 standard drinks    Types: 2 Shots of liquor per week    Comment: Minimal alcohol consumption  . Drug use: No  . Sexual activity: Yes    Partners: Female  Other Topics Concern  . Not on file  Social History Narrative  . Not on file   Social Determinants of Health   Financial Resource Strain: Low Risk   . Difficulty of Paying Living Expenses: Not hard at all  Food Insecurity: No Food Insecurity  . Worried About Charity fundraiser in the Last Year: Never true  . Ran Out of Food in the Last Year: Never true  Transportation Needs: No Transportation Needs  . Lack of Transportation (Medical): No  . Lack of Transportation (Non-Medical): No  Physical Activity: Sufficiently Active  . Days of Exercise per Week: 5 days  . Minutes of Exercise per Session: 60 min  Stress: No Stress Concern Present  . Feeling of Stress :  Not at all  Social Connections: Unknown  . Frequency of Communication with Friends and Family: Three times a week  . Frequency of Social Gatherings with Friends and Family: More than three times a week  . Attends Religious Services: More than 4 times per year  . Active Member of Clubs or Organizations: Patient refused  . Attends Archivist Meetings: Patient refused  . Marital Status: Married  Human resources officer Violence: Not At Risk  . Fear of Current or Ex-Partner: No  . Emotionally Abused: No  . Physically Abused: No  . Sexually Abused: No    Past Surgical History:  Procedure Laterality Date  . Beam Radiation     Prostate using Photons  . COLONOSCOPY WITH PROPOFOL N/A 09/18/2020   Procedure: COLONOSCOPY WITH PROPOFOL;  Surgeon: Virgel Manifold, MD;  Location: [facility] ENDOSCOPY;  Service: Endoscopy;  Laterality: N/A;  . ILIAC VEIN ANGIOPLASTY / STENTING    . LEFT HEART CATH  01/11/2012  . NEPHRECTOMY Right 2010   Partial    Family History  Problem Relation Age of Onset  . Hypertension Mother   . Lung cancer Father   . Hypertension Father   . Hyperlipidemia Father   . Diabetes Sister        oldest sister  . Hypertension Sister   . Hypertension Sister     Allergies  Allergen Reactions  . Lisinopril Swelling    Swelling around mouth, lips    CBC Latest Ref Rng & Units 07/31/2020 07/28/2019 07/28/2018  WBC 3.8 - 10.8 Thousand/uL 9.1 6.8 7.3  Hemoglobin 13.2 - 17.1 g/dL 14.3 13.5 13.4  Hematocrit 38.5 - 50.0 % 41.1 39.5 39.9  Platelets 140 - 400 Thousand/uL 286 263 264      CMP     Component Value Date/Time   NA 138 07/31/2020 1050   NA 141 08/02/2015 0853   NA 139 05/17/2013 0447   K 4.1 07/31/2020 1050   K 4.0 05/17/2013 0447   CL 110 07/31/2020 1050   CL 109 (H) 05/17/2013 0447   CO2 20 07/31/2020 1050   CO2 23 05/17/2013 0447   GLUCOSE 97 07/31/2020 1050   GLUCOSE 150 (H) 05/17/2013 0447   BUN 16 07/31/2020 1050   BUN 15 08/02/2015 0853    BUN 19 (H) 05/17/2013 0447   CREATININE 1.04 07/31/2020  1050   CALCIUM 10.1 07/31/2020 1050   CALCIUM 9.1 05/17/2013 0447   PROT 7.3 07/31/2020 1050   PROT 7.2 08/02/2015 0853   PROT 7.5 05/02/2011 0943   ALBUMIN 3.9 06/06/2016 0804   ALBUMIN 4.2 08/02/2015 0853   ALBUMIN 3.9 05/02/2011 0943   AST 25 07/31/2020 1050   AST 31 05/02/2011 0943   ALT 32 07/31/2020 1050   ALT 37 05/02/2011 0943   ALKPHOS 52 06/06/2016 0804   ALKPHOS 54 05/02/2011 0943   BILITOT 0.5 07/31/2020 1050   BILITOT 0.3 08/02/2015 0853   BILITOT 0.5 05/02/2011 0943   GFRNONAA 78 07/31/2020 1050   GFRAA 90 07/31/2020 1050     No results found.     Assessment & Plan:   1. PVD (peripheral vascular disease) ([place]) Recommend:  The patient has experienced increased symptoms and is now describing lifestyle limiting claudication and mild rest pain.   Given the severity of the patient's lower extremity symptoms the patient should undergo angiography and intervention.  Risk and benefits were reviewed the patient.  Indications for the procedure were reviewed.  All questions were answered, the patient agrees to proceed.   The patient should continue walking and begin a more formal exercise program.  The patient should continue antiplatelet therapy and aggressive treatment of the lipid abnormalities  The patient will follow up with me after the angiogram.    2. Benign essential HTN Continue antihypertensive medications as already ordered, these medications have been reviewed and there are no changes at this time.   3. Dyslipidemia Continue statin as ordered and reviewed, no changes at this time   4. Unequal blood pressure in upper extremities It was noted today that the patient had a discrepancy between blood pressures in his upper extremities.  We will evaluate for possible subclavian stenosis when the patient follows up with ABIs postprocedure. - VAS US CAROTID; Future   Current Outpatient Medications on  File Prior to Visit  Medication Sig Dispense Refill  . amLODipine-olmesartan (AZOR) 10-40 MG tablet Take 1 tablet by mouth daily. 90 tablet 1  . aspirin 81 MG tablet Take 1 tablet by mouth daily.    Marland Kitchen atorvastatin (LIPITOR) 80 MG tablet Take 1 tablet (80 mg total) by mouth daily. 90 tablet 1  . carvedilol (COREG) 12.5 MG tablet Take 1 tablet (12.5 mg total) by mouth 2 (two) times daily. 180 tablet 1  . cholecalciferol (VITAMIN D) 1000 UNITS tablet Take 1 tablet by mouth daily.    . clopidogrel (PLAVIX) 75 MG tablet Take 1 tablet (75 mg total) by mouth daily. 90 tablet 1  . COVID-19 mRNA Vac-TriS, Pfizer, (PFIZER-BIONT COVID-19 VAC-TRIS) SUSP injection Inject into the muscle. 0.3 mL 0  . nitroGLYCERIN (NITROSTAT) 0.4 MG SL tablet Place 1 tablet (0.4 mg total) under the tongue daily. 30 tablet 0  . sildenafil (VIAGRA) 50 MG tablet Take by mouth.     No current facility-administered medications on file prior to visit.    There are no Patient Instructions on file for this visit. No follow-ups on file.   Kris Hartmann, NP

## 2020-10-09 ENCOUNTER — Encounter (INDEPENDENT_AMBULATORY_CARE_PROVIDER_SITE_OTHER): Payer: Self-pay

## 2020-10-10 ENCOUNTER — Telehealth (INDEPENDENT_AMBULATORY_CARE_PROVIDER_SITE_OTHER): Payer: Self-pay

## 2020-10-10 ENCOUNTER — Encounter (INDEPENDENT_AMBULATORY_CARE_PROVIDER_SITE_OTHER): Payer: Self-pay

## 2020-10-10 NOTE — Telephone Encounter (Signed)
Patient is schedule for left lower extremity angio with Dr Lucky Cowboy on 10/18/20 at [facility] arrival time 10 am. Pre-procedure instructions were gone over with the patient and will be mailed out

## 2020-10-12 NOTE — Telephone Encounter (Signed)
Patient is schedule 

## 2020-10-17 ENCOUNTER — Other Ambulatory Visit (INDEPENDENT_AMBULATORY_CARE_PROVIDER_SITE_OTHER): Payer: Self-pay | Admitting: Nurse Practitioner

## 2020-10-18 ENCOUNTER — Encounter: Payer: Self-pay | Admitting: Vascular Surgery

## 2020-10-18 ENCOUNTER — Ambulatory Visit
Admission: RE | Admit: 2020-10-18 | Discharge: 2020-10-18 | Disposition: A | Discharge status: Home / Self Care | Payer: Federal, State, Local not specified - PPO | Source: Ambulatory Visit | Attending: Vascular Surgery | Admitting: Vascular Surgery

## 2020-10-18 ENCOUNTER — Other Ambulatory Visit: Payer: Self-pay

## 2020-10-18 ENCOUNTER — Encounter
Admission: RE | Disposition: A | Discharge status: Home / Self Care | Payer: Self-pay | Source: Ambulatory Visit | Attending: Vascular Surgery

## 2020-10-18 DIAGNOSIS — Z888 Allergy status to other drugs, medicaments and biological substances status: Secondary | ICD-10-CM | POA: Diagnosis not present

## 2020-10-18 DIAGNOSIS — I708 Atherosclerosis of other arteries: Secondary | ICD-10-CM | POA: Diagnosis not present

## 2020-10-18 DIAGNOSIS — Z87891 Personal history of nicotine dependence: Secondary | ICD-10-CM | POA: Insufficient documentation

## 2020-10-18 DIAGNOSIS — I70212 Atherosclerosis of native arteries of extremities with intermittent claudication, left leg: Secondary | ICD-10-CM | POA: Insufficient documentation

## 2020-10-18 DIAGNOSIS — I10 Essential (primary) hypertension: Secondary | ICD-10-CM | POA: Diagnosis not present

## 2020-10-18 DIAGNOSIS — Z79899 Other long term (current) drug therapy: Secondary | ICD-10-CM | POA: Insufficient documentation

## 2020-10-18 DIAGNOSIS — Z7982 Long term (current) use of aspirin: Secondary | ICD-10-CM | POA: Diagnosis not present

## 2020-10-18 DIAGNOSIS — I70222 Atherosclerosis of native arteries of extremities with rest pain, left leg: Secondary | ICD-10-CM | POA: Diagnosis not present

## 2020-10-18 DIAGNOSIS — E785 Hyperlipidemia, unspecified: Secondary | ICD-10-CM | POA: Insufficient documentation

## 2020-10-18 DIAGNOSIS — I70219 Atherosclerosis of native arteries of extremities with intermittent claudication, unspecified extremity: Secondary | ICD-10-CM

## 2020-10-18 HISTORY — PX: LOWER EXTREMITY ANGIOGRAPHY: CATH118251

## 2020-10-18 LAB — CREATININE, SERUM
Creatinine, Ser: 0.92 mg/dL (ref 0.61–1.24)
GFR, Estimated: >60 mL/min (ref >60)

## 2020-10-18 LAB — BUN: BUN: 17 mg/dL (ref 6–20)

## 2020-10-18 SURGERY — LOWER EXTREMITY ANGIOGRAPHY
Anesthesia: Moderate Sedation | Site: Leg Lower | Laterality: Left

## 2020-10-18 MED ORDER — HEPARIN SODIUM (PORCINE) 1000 UNIT/ML IJ SOLN
INTRAMUSCULAR | Status: DC | PRN
  Administered 2020-10-18: 5000 [IU] via INTRAVENOUS

## 2020-10-18 MED ORDER — FENTANYL CITRATE PF 50 MCG/ML IJ SOSY
PREFILLED_SYRINGE | INTRAMUSCULAR | Status: AC
  Filled 2020-10-18: qty 1

## 2020-10-18 MED ORDER — MIDAZOLAM HCL 2 MG/2ML IJ SOLN
INTRAMUSCULAR | Status: DC | PRN
  Administered 2020-10-18 (×3): 1 mg via INTRAVENOUS
  Administered 2020-10-18: 2 mg via INTRAVENOUS

## 2020-10-18 MED ORDER — HYDROMORPHONE HCL 1 MG/ML IJ SOLN: 1.0000 mg | Freq: Once | INTRAMUSCULAR | Status: DC | PRN

## 2020-10-18 MED ORDER — FENTANYL CITRATE PF 50 MCG/ML IJ SOSY
PREFILLED_SYRINGE | INTRAMUSCULAR | Status: AC
  Filled 2020-10-18: qty 2

## 2020-10-18 MED ORDER — MIDAZOLAM HCL 2 MG/2ML IJ SOLN
INTRAMUSCULAR | Status: AC
  Filled 2020-10-18: qty 4

## 2020-10-18 MED ORDER — CEFAZOLIN SODIUM-DEXTROSE 2-4 GM/100ML-% IV SOLN: 2.0000 g | Freq: Once | INTRAVENOUS | Status: AC

## 2020-10-18 MED ORDER — ONDANSETRON HCL 4 MG/2ML IJ SOLN: 4.0000 mg | Freq: Four times a day (QID) | INTRAMUSCULAR | Status: DC | PRN

## 2020-10-18 MED ORDER — SODIUM CHLORIDE 0.9 % IV SOLN: INTRAVENOUS | Status: DC

## 2020-10-18 MED ORDER — METHYLPREDNISOLONE SODIUM SUCC 125 MG IJ SOLR: 125.0000 mg | Freq: Once | INTRAMUSCULAR | Status: DC | PRN

## 2020-10-18 MED ORDER — DIPHENHYDRAMINE HCL 50 MG/ML IJ SOLN: 50.0000 mg | Freq: Once | INTRAMUSCULAR | Status: DC | PRN

## 2020-10-18 MED ORDER — ACETAMINOPHEN 325 MG PO TABS: 650.0000 mg | ORAL_TABLET | ORAL | Status: DC | PRN

## 2020-10-18 MED ORDER — SODIUM CHLORIDE 0.9% FLUSH: 3.0000 mL | INTRAVENOUS | Status: DC | PRN

## 2020-10-18 MED ORDER — FENTANYL CITRATE (PF) 100 MCG/2ML IJ SOLN
INTRAMUSCULAR | Status: DC | PRN
  Administered 2020-10-18: 25 ug via INTRAVENOUS
  Administered 2020-10-18: 50 ug via INTRAVENOUS
  Administered 2020-10-18 (×2): 25 ug via INTRAVENOUS

## 2020-10-18 MED ORDER — CEFAZOLIN SODIUM-DEXTROSE 2-4 GM/100ML-% IV SOLN
INTRAVENOUS | Status: AC
  Administered 2020-10-18: 2 g via INTRAVENOUS
  Filled 2020-10-18: qty 100

## 2020-10-18 MED ORDER — FAMOTIDINE 20 MG PO TABS: 40.0000 mg | ORAL_TABLET | Freq: Once | ORAL | Status: DC | PRN

## 2020-10-18 MED ORDER — MIDAZOLAM HCL 2 MG/2ML IJ SOLN
INTRAMUSCULAR | Status: AC
  Filled 2020-10-18: qty 2

## 2020-10-18 MED ORDER — SODIUM CHLORIDE 0.9% FLUSH: 3.0000 mL | Freq: Two times a day (BID) | INTRAVENOUS | Status: DC

## 2020-10-18 MED ORDER — HEPARIN SODIUM (PORCINE) 1000 UNIT/ML IJ SOLN
INTRAMUSCULAR | Status: AC
  Filled 2020-10-18: qty 1

## 2020-10-18 MED ORDER — LABETALOL HCL 5 MG/ML IV SOLN
10.0000 mg | INTRAVENOUS | Status: DC | PRN
Start: 2020-10-18 — End: 2020-10-18

## 2020-10-18 MED ORDER — MIDAZOLAM HCL 2 MG/ML PO SYRP: 8.0000 mg | ORAL_SOLUTION | Freq: Once | ORAL | Status: DC | PRN

## 2020-10-18 MED ORDER — SODIUM CHLORIDE 0.9 % IV SOLN: 250.0000 mL | INTRAVENOUS | Status: DC | PRN

## 2020-10-18 MED ORDER — HYDRALAZINE HCL 20 MG/ML IJ SOLN
5.0000 mg | INTRAMUSCULAR | Status: DC | PRN
Start: 2020-10-18 — End: 2020-10-18

## 2020-10-18 SURGICAL SUPPLY — 18 items
BALLN LUTONIX 7X80X130 (BALLOONS) ×2
BALLN LUTONIX DCB 7X60X130 (BALLOONS) ×2
BALLOON LUTONIX 7X80X130 (BALLOONS) ×1 IMPLANT
BALLOON LUTONIX DCB 7X60X130 (BALLOONS) ×1 IMPLANT
CATH ANGIO 5F PIGTAIL 65CM (CATHETERS) ×2 IMPLANT
CATH G XP .038X100 (CATHETERS) ×2 IMPLANT
COVER PROBE U/S 5X48 (MISCELLANEOUS) ×2 IMPLANT
DEVICE STARCLOSE SE CLOSURE (Vascular Products) ×2 IMPLANT
DEVICE TORQUE (MISCELLANEOUS) ×2 IMPLANT
GLIDEWIRE ADV .035X260CM (WIRE) ×2 IMPLANT
KIT ENCORE 26 ADVANTAGE (KITS) ×2 IMPLANT
PACK ANGIOGRAPHY (CUSTOM PROCEDURE TRAY) ×2 IMPLANT
SHEATH BRITE TIP 5FRX11 (SHEATH) ×2 IMPLANT
SHEATH BRITE TIP 6FR X 23 (SHEATH) ×2 IMPLANT
STENT LIFESTAR 9X60 (Permanent Stent) ×2 IMPLANT
SYR MEDRAD MARK 7 150ML (SYRINGE) ×2 IMPLANT
TUBING CONTRAST HIGH PRESS 48 (TUBING) ×4 IMPLANT
WIRE GUIDERIGHT .035X150 (WIRE) ×2 IMPLANT

## 2020-10-18 NOTE — Interval H&P Note (Signed)
History and Physical Interval Note:  10/18/2020 10:24 AM  Roberto Walker  has presented today for surgery, with the diagnosis of LLE Angio   BARD   ASO w claudication.  The various methods of treatment have been discussed with the patient and family. After consideration of risks, benefits and other options for treatment, the patient has consented to  Procedure(s): LOWER EXTREMITY ANGIOGRAPHY (Left) as a surgical intervention.  The patient's history has been reviewed, patient examined, no change in status, stable for surgery.  I have reviewed the patient's chart and labs.  Questions were answered to the patient's satisfaction.     Leotis Pain

## 2020-10-18 NOTE — Op Note (Signed)
[facility]  Percutaneous Study/Intervention Procedural Note   Date of Surgery: 10/18/2020  Surgeon(s):Katana Berthold    Assistants:none  Pre-operative Diagnosis: PAD with claudication left lower extremity  Post-operative diagnosis:  Same  Procedure(s) Performed:             1.  Ultrasound guidance for vascular access right femoral artery             2.  Catheter placement into left SFA from right femoral approach             3.  Aortogram and selective left lower extremity angiogram             4.  Percutaneous transluminal angioplasty of left external iliac artery with 7 mm diameter Lutonix drug-coated angioplasty balloon             5.  Stent placement to the left external iliac artery with 9 mm diameter by 6 cm length life star stent  6.  StarClose closure device right femoral artery  EBL: 5 cc  Contrast: 65 cc  Fluoro Time: 6.5 minutes  Moderate Conscious Sedation Time: approximately 40 minutes using 5 mg of Versed and 125 mcg of Fentanyl              Indications:  Patient is a 60 y.o.male with a history of PAD status post intervention 7 years ago for left iliac disease. The patient has noninvasive study showing reduced ABI on the left and this is the leg where he has significant claudication symptoms. The patient is brought in for angiography for further evaluation and potential treatment.  Risks and benefits are discussed and informed consent is obtained.   Procedure:  The patient was identified and appropriate procedural time out was performed.  The patient was then placed supine on the table and prepped and draped in the usual sterile fashion. Moderate conscious sedation was administered during a face to face encounter with the patient throughout the procedure with my supervision of the RN administering medicines and monitoring the patient's vital signs, pulse oximetry, telemetry and mental status throughout from the start of the procedure until the patient  was taken to the recovery room. Ultrasound was used to evaluate the right common femoral artery.  It was patent .  A digital ultrasound image was acquired.  A Seldinger needle was used to access the right common femoral artery under direct ultrasound guidance and a permanent image was performed.  A 0.035 J wire was advanced without resistance and a 5Fr sheath was placed.  Pigtail catheter was placed into the aorta and an AP aortogram was performed. This demonstrated normal renal arteries and normal aorta.  The iliac arteries were highly tortuous.  The right common iliac artery had no significant disease but was aneurysmal at least mildly.  The right external iliac artery appeared to have some degree of stenosis at the proximal portion but this did not appear to be high-grade.  The left common iliac artery had a moderate stenosis in the 60% range but the artery was very large and either ectatic or mildly aneurysmal itself.  The 2 previously placed stents were patent, but at the distal edge of the more distal of the 2 stents in the proximal to mid left external iliac artery, there was a high-grade stenosis of greater than 80%. I then crossed the aortic bifurcation and advanced to the left femoral head and then into the left SFA.  This was tedious due to the marked  tortuosity and the stenoses in the left external and common iliac arteries.  This required a rim catheter and advantage wire and then exchanged for a glide catheter which was taken down in the proximal SFA for purchase before evaluating the left lower extremity. Selective left lower extremity angiogram was then performed. This demonstrated normal common femoral artery, profunda femoris artery, superficial femoral and popliteal arteries.  There is a normal tibial trifurcation although flow distally was sluggish and was difficult to evaluate the tibial vessels without very long runs.  The posterior tibial artery was large and continuous to the foot.  But the  anterior tibial and peroneal arteries did not have any obvious focal stenosis but flow did not ever get into the foot. It was felt that it was in the patient's best interest to proceed with intervention after these images to avoid a second procedure and a larger amount of contrast and fluoroscopy based off of the findings from the initial angiogram. The patient was systemically heparinized and a 6 French 23 cm sheath was then placed over the Terumo Advantage wire.  Left common iliac artery lesion was not able to be treated today as this would have to be treated with a iliac limb or a aneurysm stent graft due to the large size of the vessel.  This was larger than any typical stent or lifestream stent we would use in this location.  This was the less severe of the 2 lesions, and I elected to treat the external iliac artery lesion today.  This was treated with a 7 mm diameter by 6 cm length Lutonix drug-coated angioplasty balloon inflated to 10 atm for 1 minute.  Completion imaging showed greater than 50% residual stenosis so I elected to place a stent.  A 9 mm diameter by 6 cm length life star stent was then deployed in the left external iliac artery encompassing the lesion extending several centimeters beyond the previously placed stents.  This is postdilated with a 7 mm balloon with excellent angiographic completion result and less than 10% residual stenosis in the left external iliac artery.  I elected to terminate the procedure. The sheath was removed and StarClose closure device was deployed in the right femoral artery with excellent hemostatic result. The patient was taken to the recovery room in stable condition having tolerated the procedure well.  Findings:               Aortogram:  This demonstrated normal renal arteries and normal aorta.  The iliac arteries were highly tortuous.  The right common iliac artery had no significant disease but was aneurysmal at least mildly.  The right external iliac artery  appeared to have some degree of stenosis at the proximal portion but this did not appear to be high-grade.  The left common iliac artery had a moderate stenosis in the 60% range but the artery was very large and either ectatic or mildly aneurysmal itself.  The 2 previously placed stents were patent, but at the distal edge of the more distal of the 2 stents in the proximal to mid left external iliac artery, there was a high-grade stenosis of greater than 80%             Left lower Extremity: Normal common femoral artery, profunda femoris artery, superficial femoral and popliteal arteries.  There is a normal tibial trifurcation although flow distally was sluggish and was difficult to evaluate the tibial vessels without very long runs.  The posterior tibial artery was  large and continuous to the foot.  But the anterior tibial and peroneal arteries did not have any obvious focal stenosis but flow did not ever get into the foot.   Disposition: Patient was taken to the recovery room in stable condition having tolerated the procedure well.  Complications: None  Leotis Pain 10/18/2020 12:38 PM   This note was created with Dragon Medical transcription system. Any errors in dictation are purely unintentional.

## 2020-10-23 ENCOUNTER — Ambulatory Visit (INDEPENDENT_AMBULATORY_CARE_PROVIDER_SITE_OTHER): Payer: Federal, State, Local not specified - PPO

## 2020-10-23 ENCOUNTER — Other Ambulatory Visit: Payer: Self-pay

## 2020-10-23 DIAGNOSIS — I2510 Atherosclerotic heart disease of native coronary artery without angina pectoris: Secondary | ICD-10-CM

## 2020-10-23 LAB — ECHOCARDIOGRAM COMPLETE
AR max vel: 3.13 cm2
AV Area VTI: 3.01 cm2
AV Area mean vel: 3.04 cm2
AV Mean grad: 4 mmHg
AV Peak grad: 8.5 mmHg
Ao pk vel: 1.46 m/s
Area-P 1/2: 3.63 cm2
Calc EF: 57.7 %
S' Lateral: 3.3 cm
Single Plane A2C EF: 52.9 %
Single Plane A4C EF: 63.8 %

## 2020-10-23 MED ORDER — PERFLUTREN LIPID MICROSPHERE
1.0000 mL | INTRAVENOUS | Status: AC | PRN
  Administered 2020-10-23: 4 mL via INTRAVENOUS

## 2020-11-06 ENCOUNTER — Other Ambulatory Visit (INDEPENDENT_AMBULATORY_CARE_PROVIDER_SITE_OTHER): Payer: Self-pay | Admitting: Vascular Surgery

## 2020-11-06 DIAGNOSIS — Z95820 Peripheral vascular angioplasty status with implants and grafts: Secondary | ICD-10-CM

## 2020-11-06 DIAGNOSIS — I70212 Atherosclerosis of native arteries of extremities with intermittent claudication, left leg: Secondary | ICD-10-CM

## 2020-11-08 ENCOUNTER — Ambulatory Visit (INDEPENDENT_AMBULATORY_CARE_PROVIDER_SITE_OTHER): Payer: Federal, State, Local not specified - PPO | Admitting: Nurse Practitioner

## 2020-11-08 ENCOUNTER — Other Ambulatory Visit: Payer: Self-pay

## 2020-11-08 ENCOUNTER — Ambulatory Visit (INDEPENDENT_AMBULATORY_CARE_PROVIDER_SITE_OTHER): Payer: Federal, State, Local not specified - PPO

## 2020-11-08 ENCOUNTER — Encounter (INDEPENDENT_AMBULATORY_CARE_PROVIDER_SITE_OTHER): Payer: Self-pay | Admitting: Nurse Practitioner

## 2020-11-08 VITALS — BP 144/77 | HR 60 | Resp 16 | Wt 202.0 lb

## 2020-11-08 DIAGNOSIS — E785 Hyperlipidemia, unspecified: Secondary | ICD-10-CM

## 2020-11-08 DIAGNOSIS — I70212 Atherosclerosis of native arteries of extremities with intermittent claudication, left leg: Secondary | ICD-10-CM

## 2020-11-08 DIAGNOSIS — I739 Peripheral vascular disease, unspecified: Secondary | ICD-10-CM | POA: Diagnosis not present

## 2020-11-08 DIAGNOSIS — R0989 Other specified symptoms and signs involving the circulatory and respiratory systems: Secondary | ICD-10-CM

## 2020-11-08 DIAGNOSIS — I6523 Occlusion and stenosis of bilateral carotid arteries: Secondary | ICD-10-CM

## 2020-11-08 DIAGNOSIS — Z95820 Peripheral vascular angioplasty status with implants and grafts: Secondary | ICD-10-CM

## 2020-11-08 DIAGNOSIS — I10 Essential (primary) hypertension: Secondary | ICD-10-CM

## 2020-11-18 ENCOUNTER — Encounter (INDEPENDENT_AMBULATORY_CARE_PROVIDER_SITE_OTHER): Payer: Self-pay | Admitting: Nurse Practitioner

## 2020-11-18 NOTE — Progress Notes (Signed)
Subjective:    Patient ID: Roberto Walker, male    DOB: 04-07-1960, 60 y.o.   MRN: [id number] Chief Complaint  Patient presents with   Routine Post Op    [facility] 3 wk with ultrasound follow up    Roberto Walker is a 60 year old male that returns to the office for followup and review status post angiogram with intervention.  The patient underwent angiogram of the left lower extremity on 10/18/2020 with a new stent to left external iliac artery.the patient notes improvement in the lower extremity symptoms. No interval shortening of the patient's claudication distance or rest pain symptoms. Previous wounds have now healed.  No new ulcers or wounds have occurred since the last visit.  Also the last office visit the patient had noted a difference in upper extremity blood pressures.  However there is no pain or discomfort that was noted when he uses his hands or arms no symptoms of subclavian steal.  There have been no significant changes to the patient's overall health care.  The patient denies amaurosis fugax or recent TIA symptoms. There are no recent neurological changes noted. The patient denies history of DVT, PE or superficial thrombophlebitis. The patient denies recent episodes of angina or shortness of breath.   ABI's Rt=1.16 and Lt=1.05  (previous ABI's Rt=1.16 and Lt=0.82) Duplex US of the right lower extremity has triphasic tibial artery waveforms with biphasic/triphasic waveforms in the left.  Patient has normal toe waveforms bilaterally.   Review of Systems  Cardiovascular:  Negative for leg swelling.  All other systems reviewed and are negative.     Objective:   Physical Exam Vitals reviewed.  HENT:     Head: Normocephalic.  Neck:     Vascular: No carotid bruit.  Cardiovascular:     Rate and Rhythm: Normal rate.     Pulses: Normal pulses.  Pulmonary:     Effort: Pulmonary effort is normal.  Skin:    General: Skin is warm and dry.  Neurological:     Mental Status: He is  alert and oriented to person, place, and time.  Psychiatric:        Mood and Affect: Mood normal.        Behavior: Behavior normal.        Thought Content: Thought content normal.        Judgment: Judgment normal.    BP (!) 144/77 (BP Location: Right Arm)   Pulse 60   Resp 16   Wt 202 lb (91.6 kg)   BMI 27.40 kg/m   Past Medical History:  Diagnosis Date   Hypercalcemia    Hyperlipidemia    Hypertension    Metabolic syndrome    Microscopic hematuria    Osteoarthrosis    Peripheral vascular disease (HCC)    Prostate cancer (HCC)    Renal cell carcinoma (HCC)    Vitamin D deficiency    Vitiligo     Social History   Socioeconomic History   Marital status: Married    Spouse name: Mateo Flow   Number of children: 2   Years of education: College    Highest education level: Not on file  Occupational History   Occupation: RDU  Tobacco Use   Smoking status: Former    Packs/day: 0.50    Years: 20.00    Pack years: 10.00    Types: Cigarettes    Quit date: 01/12/2012    Years since quitting: 8.8   Smokeless tobacco: Never  Vaping Use  Vaping Use: Never used  Substance and Sexual Activity   Alcohol use: Yes    Alcohol/week: 2.0 standard drinks    Types: 2 Shots of liquor per week    Comment: Minimal alcohol consumption   Drug use: No   Sexual activity: Yes    Partners: Female  Other Topics Concern   Not on file  Social History Narrative   Not on file   Social Determinants of Health   Financial Resource Strain: Low Risk    Difficulty of Paying Living Expenses: Not hard at all  Food Insecurity: No Food Insecurity   Worried About Charity fundraiser in the Last Year: Never true   [place] in the Last Year: Never true  Transportation Needs: No Transportation Needs   Lack of Transportation (Medical): No   Lack of Transportation (Non-Medical): No  Physical Activity: Sufficiently Active   Days of Exercise per Week: 5 days   Minutes of Exercise per Session:  60 min  Stress: No Stress Concern Present   Feeling of Stress : Not at all  Social Connections: Unknown   Frequency of Communication with Friends and Family: Three times a week   Frequency of Social Gatherings with Friends and Family: More than three times a week   Attends Religious Services: More than 4 times per year   Active Member of Clubs or Organizations: Patient refused   Attends Archivist Meetings: Patient refused   Marital Status: Married  Human resources officer Violence: Not At Risk   Fear of Current or Ex-Partner: No   Emotionally Abused: No   Physically Abused: No   Sexually Abused: No    Past Surgical History:  Procedure Laterality Date   Beam Radiation     Prostate using Photons   COLONOSCOPY WITH PROPOFOL N/A 09/18/2020   Procedure: COLONOSCOPY WITH PROPOFOL;  Surgeon: Virgel Manifold, MD;  Location: [facility] ENDOSCOPY;  Service: Endoscopy;  Laterality: N/A;   ILIAC VEIN ANGIOPLASTY / STENTING     LEFT HEART CATH  01/11/2012   LOWER EXTREMITY ANGIOGRAPHY Left 10/18/2020   Procedure: LOWER EXTREMITY ANGIOGRAPHY;  Surgeon: Algernon Huxley, MD;  Location: [facility];  Service: Cardiovascular;  Laterality: Left;   NEPHRECTOMY Right 2010   Partial    Family History  Problem Relation Age of Onset   Hypertension Mother    Lung cancer Father    Hypertension Father    Hyperlipidemia Father    Diabetes Sister        oldest sister   Hypertension Sister    Hypertension Sister     Allergies  Allergen Reactions   Lisinopril Swelling    Swelling around mouth, lips    CBC Latest Ref Rng & Units 07/31/2020 07/28/2019 07/28/2018  WBC 3.8 - 10.8 Thousand/uL 9.1 6.8 7.3  Hemoglobin 13.2 - 17.1 g/dL 14.3 13.5 13.4  Hematocrit 38.5 - 50.0 % 41.1 39.5 39.9  Platelets 140 - 400 Thousand/uL 286 263 264      CMP     Component Value Date/Time   NA 138 07/31/2020 1050   NA 141 08/02/2015 0853   NA 139 05/17/2013 0447   K 4.1 07/31/2020 1050   K 4.0  05/17/2013 0447   CL 110 07/31/2020 1050   CL 109 (H) 05/17/2013 0447   CO2 20 07/31/2020 1050   CO2 23 05/17/2013 0447   GLUCOSE 97 07/31/2020 1050   GLUCOSE 150 (H) 05/17/2013 0447   BUN 17 10/18/2020 1044  BUN 15 08/02/2015 0853   BUN 19 (H) 05/17/2013 0447   CREATININE 0.92 10/18/2020 1044   CREATININE 1.04 07/31/2020 1050   CALCIUM 10.1 07/31/2020 1050   CALCIUM 9.1 05/17/2013 0447   PROT 7.3 07/31/2020 1050   PROT 7.2 08/02/2015 0853   PROT 7.5 05/02/2011 0943   ALBUMIN 3.9 06/06/2016 0804   ALBUMIN 4.2 08/02/2015 0853   ALBUMIN 3.9 05/02/2011 0943   AST 25 07/31/2020 1050   AST 31 05/02/2011 0943   ALT 32 07/31/2020 1050   ALT 37 05/02/2011 0943   ALKPHOS 52 06/06/2016 0804   ALKPHOS 54 05/02/2011 0943   BILITOT 0.5 07/31/2020 1050   BILITOT 0.3 08/02/2015 0853   BILITOT 0.5 05/02/2011 0943   GFRNONAA >60 10/18/2020 1044   GFRNONAA 78 07/31/2020 1050   GFRAA 90 07/31/2020 1050     VAS [place] ABI WITH/WO TBI  Result Date: 11/16/2020  LOWER EXTREMITY DOPPLER STUDY Patient Name:  BASIR NIVEN  Date of Exam:   11/08/2020 Medical Rec #: [id number]      Accession #:    [id number] Date of Birth: Mar 26, 1960     Patient Gender: M Patient Age:   54 years Exam Location:  [facility] Procedure:      VAS [place] ABI WITH/WO TBI Referring Phys: Corene Cornea DEW --------------------------------------------------------------------------------  Indications: Peripheral artery disease.  Vascular Interventions: 05/16/2013 PTA left external iiac artery, popliteal                         aretry, tibioperoneal trunk, left posterior and anterior                         tibial arteries. Stent placement to left common iliac                         artery. Previously placed stent to the left EIA in 2012.                         10/18/2020 PTA and new stent in the left EIA. Comparison Study: 10/16/2020 Performing Technologist: Concha Norway RVT  Examination Guidelines: A complete evaluation includes at  minimum, Doppler waveform signals and systolic blood pressure reading at the level of bilateral brachial, anterior tibial, and posterior tibial arteries, when vessel segments are accessible. Bilateral testing is considered an integral part of a complete examination. Photoelectric Plethysmograph (PPG) waveforms and toe systolic pressure readings are included as required and additional duplex testing as needed. Limited examinations for reoccurring indications may be performed as noted.  ABI Findings: +---------+------------------+-----+---------+--------+ Right    Rt Pressure (mmHg)IndexWaveform Comment  +---------+------------------+-----+---------+--------+ Brachial 123                                      +---------+------------------+-----+---------+--------+ ATA      151                    triphasic1.07     +---------+------------------+-----+---------+--------+ PTA      163               1.16 triphasic1.16     +---------+------------------+-----+---------+--------+ Great Toe169               1.20 Normal            +---------+------------------+-----+---------+--------+ +---------+------------------+-----+---------+-------+  Left     Lt Pressure (mmHg)IndexWaveform Comment +---------+------------------+-----+---------+-------+ Brachial 141                                     +---------+------------------+-----+---------+-------+ ATA      133                    biphasic .94     +---------+------------------+-----+---------+-------+ PTA      148               1.05 triphasic1.05    +---------+------------------+-----+---------+-------+ Kizzie Ide               1.09 Normal           +---------+------------------+-----+---------+-------+ +-------+-----------+-----------+------------+------------+ ABI/TBIToday's ABIToday's TBIPrevious ABIPrevious TBI +-------+-----------+-----------+------------+------------+ Right  1.16       1.20       1.16         1.11         +-------+-----------+-----------+------------+------------+ Left   1.05       1.09       .82         .99          +-------+-----------+-----------+------------+------------+ Left ABIs appear increased compared to prior study on 10/16/2020.  Summary: Right: Resting right ankle-brachial index is within normal range. No evidence of significant right lower extremity arterial disease. The right toe-brachial index is normal. Left: Resting left ankle-brachial index is within normal range. No evidence of significant left lower extremity arterial disease. The left toe-brachial index is normal.  *See table(s) above for measurements and observations.  Electronically signed by Leotis Pain MD on 11/16/2020 at 8:58:12 AM.    Final    VAS [place] ABI WITH/WO TBI  Result Date: 10/16/2020  LOWER EXTREMITY DOPPLER STUDY Patient Name:  JORDAN PARDINI  Date of Exam:   10/03/2020 Medical Rec #: [id number]      Accession #:    [id number] Date of Birth: 1960/07/29     Patient Gender: M Patient Age:   54 years Exam Location:  [facility] Procedure:      VAS [place] ABI WITH/WO TBI Referring Phys: Eulogio Ditch --------------------------------------------------------------------------------  Indications: Peripheral artery disease.  Vascular Interventions: 05/16/2013 PTA left external iiac artery, popliteal                         aretry, tibioperoneal trunk, left posterior and anterior                         tibial arteries. Stent placement to left common iliac                         artery. Previously placed stent to the left EIA in 2012. Comparison Study: 12/13/2018 Performing Technologist: Charlane Ferretti RT (R)(VS)  Examination Guidelines: A complete evaluation includes at minimum, Doppler waveform signals and systolic blood pressure reading at the level of bilateral brachial, anterior tibial, and posterior tibial arteries, when vessel segments are accessible. Bilateral testing is considered an integral part of a  complete examination. Photoelectric Plethysmograph (PPG) waveforms and toe systolic pressure readings are included as required and additional duplex testing as needed. Limited examinations for reoccurring indications may be performed as noted.  ABI Findings: +---------+------------------+-----+---------+--------+ Right    Rt Pressure (mmHg)IndexWaveform Comment  +---------+------------------+-----+---------+--------+ Brachial 116                                      +---------+------------------+-----+---------+--------+  ATA      153                    triphasic         +---------+------------------+-----+---------+--------+ PTA      151               1.14 triphasic         +---------+------------------+-----+---------+--------+ Great Toe147               1.11 Normal            +---------+------------------+-----+---------+--------+ +---------+------------------+-----+----------+-------+ Left     Lt Pressure (mmHg)IndexWaveform  Comment +---------+------------------+-----+----------+-------+ Brachial 132                                      +---------+------------------+-----+----------+-------+ ATA      108                    monophasic        +---------+------------------+-----+----------+-------+ PTA      108               0.82 monophasic        +---------+------------------+-----+----------+-------+ Great Toe131               0.99 Abnormal          +---------+------------------+-----+----------+-------+ +-------+-----------+-----------+------------+------------+ ABI/TBIToday's ABIToday's TBIPrevious ABIPrevious TBI +-------+-----------+-----------+------------+------------+ Right  1.16       1.11       1.24        .97          +-------+-----------+-----------+------------+------------+ Left   .82        .99        1.09        1.18         +-------+-----------+-----------+------------+------------+ Right ABIs appear essentially  unchanged compared to prior study on 12/13/2018. Left ABIs appear decreased compared to prior study on 12/13/2018. Bilateral TBI's appear essentially unchanged as compared to the previous exam on 12/13/2018.  Summary: Right: Resting right ankle-brachial index indicates noncompressible right lower extremity arteries. The right toe-brachial index is normal. Left: Resting left ankle-brachial index indicates mild left lower extremity arterial disease. The left toe-brachial index is normal. *See table(s) above for measurements and observations.  Electronically signed by Leotis Pain MD on 10/16/2020 at 12:52:30 PM.    Final        Assessment & Plan:   1. PVD (peripheral vascular disease) ([place]) Recommend:  The patient is status post successful angiogram with intervention.  The patient reports that the claudication symptoms and leg pain is essentially gone.   The patient denies lifestyle limiting changes at this point in time.  No further invasive studies, angiography or surgery at this time The patient should continue walking and begin a more formal exercise program.  The patient should continue antiplatelet therapy and aggressive treatment of the lipid abnormalities   Patient should undergo noninvasive studies as ordered. The patient will follow up with me after the studies.   - VAS [place] ABI WITH/WO TBI; Future  2. Benign essential HTN Continue antihypertensive medications as already ordered, these medications have been reviewed and there are no changes at this time.   3. Unequal blood pressure in upper extremities Today the patient had no evidence of subclavian stenosis noted in his ultrasound however there is some minimal carotid stenosis.  4. Dyslipidemia Continue statin as ordered and  reviewed, no changes at this time   5. Bilateral carotid artery stenosis Recommend:  Given the patient's asymptomatic subcritical stenosis no further invasive testing or surgery at this time.  Duplex ultrasound  shows 1 to 39% stenosis bilaterally.  Continue antiplatelet therapy as prescribed Continue management of CAD, HTN and Hyperlipidemia Healthy heart diet,  encouraged exercise at least 4 times per week Follow up in 12 months with duplex ultrasound and physical exam     Current Outpatient Medications on File Prior to Visit  Medication Sig Dispense Refill   amLODipine-olmesartan (AZOR) 10-40 MG tablet Take 1 tablet by mouth daily. 90 tablet 1   aspirin 81 MG tablet Take 1 tablet by mouth daily.     atorvastatin (LIPITOR) 80 MG tablet Take 1 tablet (80 mg total) by mouth daily. 90 tablet 1   carvedilol (COREG) 12.5 MG tablet Take 1 tablet (12.5 mg total) by mouth 2 (two) times daily. 180 tablet 1   cholecalciferol (VITAMIN D) 1000 UNITS tablet Take 1 tablet by mouth daily.     clopidogrel (PLAVIX) 75 MG tablet Take 1 tablet (75 mg total) by mouth daily. 90 tablet 1   COVID-19 mRNA Vac-TriS, Pfizer, (PFIZER-BIONT COVID-19 VAC-TRIS) SUSP injection Inject into the muscle. 0.3 mL 0   nitroGLYCERIN (NITROSTAT) 0.4 MG SL tablet Place 1 tablet (0.4 mg total) under the tongue daily. 30 tablet 0   sildenafil (VIAGRA) 50 MG tablet Take by mouth.     No current facility-administered medications on file prior to visit.    There are no Patient Instructions on file for this visit. Return in about 3 months (around 02/08/2021) for PAD with ABIs; JD/FB .   Kris Hartmann, NP

## 2020-12-04 ENCOUNTER — Ambulatory Visit (INDEPENDENT_AMBULATORY_CARE_PROVIDER_SITE_OTHER): Admitting: Vascular Surgery

## 2020-12-04 ENCOUNTER — Encounter (INDEPENDENT_AMBULATORY_CARE_PROVIDER_SITE_OTHER)

## 2020-12-07 ENCOUNTER — Other Ambulatory Visit: Payer: Self-pay

## 2020-12-07 ENCOUNTER — Ambulatory Visit: Payer: Federal, State, Local not specified - PPO | Attending: Internal Medicine

## 2020-12-07 DIAGNOSIS — Z23 Encounter for immunization: Secondary | ICD-10-CM

## 2020-12-07 MED ORDER — PFIZER COVID-19 VAC BIVALENT 30 MCG/0.3ML IM SUSP
INTRAMUSCULAR | 0 refills | Status: DC
  Filled 2020-12-07: qty 0.3, 1d supply, fill #0

## 2020-12-07 NOTE — Progress Notes (Signed)
   Covid-19 Vaccination Clinic  Name:  IVIE MAESE    MRN: [id number] DOB: May 12, 1960  12/07/2020  Mr. Kindt was observed post Covid-19 immunization for 15 minutes without incident. He was provided with Vaccine Information Sheet and instruction to access the V-Safe system.   Mr. Troop was instructed to call 911 with any severe reactions post vaccine: Difficulty breathing  Swelling of face and throat  A fast heartbeat  A bad rash all over body  Dizziness and weakness   Immunizations Administered     Name Date Dose VIS Date Route   Pfizer Covid-19 Vaccine Bivalent Booster 12/07/2020  1:00 PM 0.3 mL 10/03/2020 Intramuscular   Manufacturer: Standard   Lot: [place]: [id number]      Lu Duffel, PharmD, MBA Clinical Acute Care Pharmacist

## 2020-12-31 ENCOUNTER — Encounter: Payer: Self-pay | Admitting: Cardiology

## 2020-12-31 ENCOUNTER — Ambulatory Visit (INDEPENDENT_AMBULATORY_CARE_PROVIDER_SITE_OTHER): Payer: Federal, State, Local not specified - PPO | Admitting: Cardiology

## 2020-12-31 ENCOUNTER — Other Ambulatory Visit: Payer: Self-pay

## 2020-12-31 VITALS — BP 140/88 | HR 49 | Ht 72.0 in | Wt 203.0 lb

## 2020-12-31 DIAGNOSIS — I2510 Atherosclerotic heart disease of native coronary artery without angina pectoris: Secondary | ICD-10-CM | POA: Diagnosis not present

## 2020-12-31 DIAGNOSIS — I739 Peripheral vascular disease, unspecified: Secondary | ICD-10-CM

## 2020-12-31 DIAGNOSIS — E7800 Pure hypercholesterolemia, unspecified: Secondary | ICD-10-CM

## 2020-12-31 DIAGNOSIS — I10 Essential (primary) hypertension: Secondary | ICD-10-CM | POA: Diagnosis not present

## 2020-12-31 NOTE — Progress Notes (Signed)
Cardiology Office Note:    Date:  12/31/2020   ID:  Roberto Walker, DOB 1960/11/28, MRN [id number]  PCP:  Steele Sizer, MD   [facility] Providers Cardiologist:  None     Referring MD: Steele Sizer, MD   Chief Complaint  Patient presents with   other    3 month follow up -- Meds reviewed verbally with patient.     History of Present Illness:    Roberto Walker is a 60 y.o. male with a hx of CAD/MI (s/p PCI to LAD 2013, RCA 80%)hypertension, hyperlipidemia, PAD (s/p PCI 2015, right common iliac artery aneurysm), former smoker x20+ years who presents for follow-up.  Previously seen to Establish care.  Echocardiogram was obtained to evaluate overall cardiac function.  He denies chest pain or shortness of breath.  Takes all medications as prescribed.  Blood pressure at home is adequately controlled, systolics in the 300-923.  Prior notes Patient previously followed at [facility] for cardiac care.  Has a history of MI, underwent drug-eluting stent to mid LAD 2013.   Patient has a history of hematuria, underwent an abdominal CT 09/26/2019 showing aortic atherosclerosis, finding of aneurysmal dilation of the right common iliac artery up to 1.7 cm noted.  He has a history of right iliac artery aneurysm, being followed by vascular surgery.  Past Medical History:  Diagnosis Date   Hypercalcemia    Hyperlipidemia    Hypertension    Metabolic syndrome    Microscopic hematuria    Osteoarthrosis    Peripheral vascular disease ([place])    Prostate cancer ([place])    Renal cell carcinoma ([place])    Vitamin D deficiency    Vitiligo     Past Surgical History:  Procedure Laterality Date   Beam Radiation     Prostate using Photons   COLONOSCOPY WITH PROPOFOL N/A 09/18/2020   Procedure: COLONOSCOPY WITH PROPOFOL;  Surgeon: Virgel Manifold, MD;  Location: [facility] ENDOSCOPY;  Service: Endoscopy;  Laterality: N/A;   ILIAC VEIN ANGIOPLASTY / STENTING     LEFT HEART CATH  01/11/2012    LOWER EXTREMITY ANGIOGRAPHY Left 10/18/2020   Procedure: LOWER EXTREMITY ANGIOGRAPHY;  Surgeon: Algernon Huxley, MD;  Location: [place] CV LAB;  Service: Cardiovascular;  Laterality: Left;   NEPHRECTOMY Right 2010   Partial    Current Medications: Current Meds  Medication Sig   amLODipine-olmesartan (AZOR) 10-40 MG tablet Take 1 tablet by mouth daily.   aspirin 81 MG tablet Take 1 tablet by mouth daily.   atorvastatin (LIPITOR) 80 MG tablet Take 1 tablet (80 mg total) by mouth daily.   carvedilol (COREG) 12.5 MG tablet Take 1 tablet (12.5 mg total) by mouth 2 (two) times daily.   cholecalciferol (VITAMIN D) 1000 UNITS tablet Take 1 tablet by mouth daily.   clopidogrel (PLAVIX) 75 MG tablet Take 1 tablet (75 mg total) by mouth daily.   COVID-19 mRNA bivalent vaccine, Pfizer, (PFIZER COVID-19 VAC BIVALENT) injection Inject into the muscle.   COVID-19 mRNA Vac-TriS, Pfizer, (PFIZER-BIONT COVID-19 VAC-TRIS) SUSP injection Inject into the muscle.   nitroGLYCERIN (NITROSTAT) 0.4 MG SL tablet Place 1 tablet (0.4 mg total) under the tongue daily.   sildenafil (VIAGRA) 50 MG tablet Take by mouth.     Allergies:   Lisinopril   Social History   Socioeconomic History   Marital status: Married    Spouse name: Roberto Walker   Number of children: 2   Years of education: [employer]  education level: Not on file  Occupational History   Occupation: RDU  Tobacco Use   Smoking status: Former    Packs/day: 0.50    Years: 20.00    Pack years: 10.00    Types: Cigarettes    Quit date: 01/12/2012    Years since quitting: 8.9   Smokeless tobacco: Never  Vaping Use   Vaping Use: Never used  Substance and Sexual Activity   Alcohol use: Yes    Alcohol/week: 2.0 standard drinks    Types: 2 Shots of liquor per week    Comment: Minimal alcohol consumption   Drug use: No   Sexual activity: Yes    Partners: Female  Other Topics Concern   Not on file  Social History Narrative   Not on file    Social Determinants of Health   Financial Resource Strain: Low Risk    Difficulty of Paying Living Expenses: Not hard at all  Food Insecurity: No Food Insecurity   Worried About Charity fundraiser in the Last Year: Never true   [place] in the Last Year: Never true  Transportation Needs: No Transportation Needs   Lack of Transportation (Medical): No   Lack of Transportation (Non-Medical): No  Physical Activity: Sufficiently Active   Days of Exercise per Week: 5 days   Minutes of Exercise per Session: 60 min  Stress: No Stress Concern Present   Feeling of Stress : Not at all  Social Connections: Unknown   Frequency of Communication with Friends and Family: Three times a week   Frequency of Social Gatherings with Friends and Family: More than three times a week   Attends Religious Services: More than 4 times per year   Active Member of Clubs or Organizations: Patient refused   Attends Archivist Meetings: Patient refused   Marital Status: Married     Family History: The patient's family history includes Diabetes in his sister; Hyperlipidemia in his father; Hypertension in his father, mother, sister, and sister; Lung cancer in his father.  ROS:   Please see the history of present illness.     All other systems reviewed and are negative.  EKGs/Labs/Other Studies Reviewed:    The following studies were reviewed today:   EKG:  EKG is  ordered today.  The ekg ordered today demonstrates sinus bradycardia, nonspecific ST-T changes  Recent Labs: 07/31/2020: ALT 32; Hemoglobin 14.3; Platelets 286; Potassium 4.1; Sodium 138 10/18/2020: BUN 17; Creatinine, Ser 0.92  Recent Lipid Panel    Component Value Date/Time   CHOL 126 07/31/2020 1050   CHOL 134 02/16/2015 0820   CHOL 118 05/02/2011 0943   TRIG 74 07/31/2020 1050   TRIG 39 05/02/2011 0943   HDL 56 07/31/2020 1050   HDL 48 02/16/2015 0820   HDL 52 05/02/2011 0943   CHOLHDL 2.3 07/31/2020 1050   VLDL 9  06/06/2016 0804   VLDL 8 05/02/2011 0943   LDLCALC 55 07/31/2020 1050   LDLCALC 58 05/02/2011 0943     Risk Assessment/Calculations:          Physical Exam:    VS:  BP 140/88 (BP Location: Left Arm, Patient Position: Sitting, Cuff Size: Normal)   Pulse (!) 49   Ht 6' (1.829 m)   Wt 203 lb (92.1 kg)   SpO2 97%   BMI 27.53 kg/m     Wt Readings from Last 3 Encounters:  12/31/20 203 lb (92.1 kg)  11/08/20 202 lb (91.6 kg)  10/18/20  198 lb (89.8 kg)     GEN:  Well nourished, well developed in no acute distress HEENT: Normal NECK: No JVD; No carotid bruits LYMPHATICS: No lymphadenopathy CARDIAC: RRR, no murmurs, rubs, gallops RESPIRATORY:  Clear to auscultation without rales, wheezing or rhonchi  ABDOMEN: Soft, non-tender, non-distended MUSCULOSKELETAL:  No edema; No deformity  SKIN: Warm and dry NEUROLOGIC:  Alert and oriented x 3 PSYCHIATRIC:  Normal affect   ASSESSMENT:    1. Coronary artery disease involving native coronary artery of native heart without angina pectoris   2. Primary hypertension   3. Pure hypercholesterolemia   4. PAD (peripheral artery disease) (HCC)     PLAN:    In order of problems listed above:  CAD/MI 2013 PCI to mid LAD.  80% mid RCA.  Denies chest pain.  Continue aspirin, Plavix, Lipitor.  LDL at goal. Echocardiogram 10/2020 shows EF 50 to 55%, impaired relaxation. Hypertension, BP elevated today, usually controlled at home.  Continue amlodipine, olmesartan, Coreg. Hyperlipidemia, LDL at goal.  Continue Lipitor 80 mg daily. PAD, status post stents, common iliac aneurysm.  Follows up with vascular surgery.  Keep follow-up appointments.  Follow-up in 6 months   Medication Adjustments/Labs and Tests Ordered: Current medicines are reviewed at length with the patient today.  Concerns regarding medicines are outlined above.  Orders Placed This Encounter  Procedures   EKG 12-Lead    No orders of the defined types were placed in this  encounter.   Patient Instructions  Medication Instructions:  Your physician recommends that you continue on your current medications as directed. Please refer to the Current Medication list given to you today.  *If you need a refill on your cardiac medications before your next appointment, please call your pharmacy*   Lab Work: None ordered If you have labs (blood work) drawn today and your tests are completely normal, you will receive your results only by: [place] (if you have MyChart) OR A paper copy in the mail If you have any lab test that is abnormal or we need to change your treatment, we will call you to review the results.   Testing/Procedures: None ordered   Follow-Up: At [facility], you and your health needs are our priority.  As part of our continuing mission to provide you with exceptional heart care, we have created designated Provider Care Teams.  These Care Teams include your primary Cardiologist (physician) and Advanced Practice Providers (APPs -  Physician Assistants and Nurse Practitioners) who all work together to provide you with the care you need, when you need it.  We recommend signing up for the patient portal called "MyChart".  Sign up information is provided on this After Visit Summary.  MyChart is used to connect with patients for Virtual Visits (Telemedicine).  Patients are able to view lab/test results, encounter notes, upcoming appointments, etc.  Non-urgent messages can be sent to your provider as well.   To learn more about what you can do with MyChart, go to [email or web address].    Your next appointment:   6 month(s)  The format for your next appointment:   In Person  Provider:   You may see Dr. Garen Lah or one of the following Advanced Practice Providers on your designated Care Team:   Murray Hodgkins, NP Christell Faith, PA-C Cadence Kathlen Mody, [place]    Other Instructions    Signed, Kate Sable, MD  12/31/2020 11:40 AM     [place]

## 2020-12-31 NOTE — Patient Instructions (Signed)
Medication Instructions:  Your physician recommends that you continue on your current medications as directed. Please refer to the Current Medication list given to you today.  *If you need a refill on your cardiac medications before your next appointment, please call your pharmacy*   Lab Work: None ordered If you have labs (blood work) drawn today and your tests are completely normal, you will receive your results only by: [place] (if you have MyChart) OR A paper copy in the mail If you have any lab test that is abnormal or we need to change your treatment, we will call you to review the results.   Testing/Procedures: None ordered   Follow-Up: At [facility], you and your health needs are our priority.  As part of our continuing mission to provide you with exceptional heart care, we have created designated Provider Care Teams.  These Care Teams include your primary Cardiologist (physician) and Advanced Practice Providers (APPs -  Physician Assistants and Nurse Practitioners) who all work together to provide you with the care you need, when you need it.  We recommend signing up for the patient portal called "MyChart".  Sign up information is provided on this After Visit Summary.  MyChart is used to connect with patients for Virtual Visits (Telemedicine).  Patients are able to view lab/test results, encounter notes, upcoming appointments, etc.  Non-urgent messages can be sent to your provider as well.   To learn more about what you can do with MyChart, go to [email or web address].    Your next appointment:   6 month(s)  The format for your next appointment:   In Person  Provider:   You may see Dr. Garen Lah or one of the following Advanced Practice Providers on your designated Care Team:   Murray Hodgkins, NP Christell Faith, PA-C Cadence Kathlen Mody, [place]    Other Instructions

## 2021-01-10 NOTE — Progress Notes (Signed)
Name: Roberto Walker   MRN: [id number]    DOB: [birth year]   Date:01/11/2021       Progress Note  Subjective  Chief Complaint  Follow Up  HPI  HTN: bp is at goal at home and also today in our office visit.Marland Kitchen He denies side effects of medications, denies decrease in exercise tolerance , chest pain, palpitation or dizziness. No decrease in exercise tolerance   History of CAD: cad S/P DES to LAD 01/11/2012, he also has 80% RCA disease who presents for f/u, doing well last EF 50 % in 2019 Taking statin therapy, aspirin and plavix daily    Hyperlipidemia: taking Atorvastatin that he gets through the [place], and is compliant , denies myalgia. Labs were  reviewed and LDL was 55, HDL was 56   OSA: he stopped wearing his CPAP since he retired in Feb 2022  No headaches in the mornings. He states no longer snoring. Explained benefits of resuming it    History of kidney and prostate cancer : he is going to [facility] to check on prostate once every 12  months. S/p radiation . He was seeing Urologist at [facility] but is now going to [place], last PSA June 2021 was normal, because of some hematuria he had repeat CT in 09/2019 showed some cysts, advised to have cystoscopy but he chose to hold off for now. He denies any hematuria , no flank pain.   Aneurysm of right common iliac : incidental finding on CT done for hematuria , discussed need for yearly follow up with Dr. Lucky Cowboy - he has an appointment coming up He is on statin therapy    PVD: sees Dr. Lucky Cowboy once a year, no claudication, no leg swelling, s/p stent placed on left leg and is doing well. He is on statin and plavix.   Pre-diabetes: he has been following life style modification such as exercise and healthy diet. He denies polyphagia, polydipsia or polyuria. He is eats a balanced diet, grains, fish, chicken, vegetables. Last A1C was up again from 5.6 % to 5.8 %    ED: he has difficulty initiating and maintaining erection since prostate radiation therapy, he  is now on Viagra, getting it through the [place] now , it works well for him    Patient Active Problem List   Diagnosis Date Noted   History of colonic polyps    Polyp of colon    Atherosclerosis of aorta (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   Impotence of organic origin 22/03/5425   Dysmetabolic syndrome 07/27/7626   Arthritis, degenerative 08/02/2014   Peripheral vascular disease ([place]) 08/02/2014   Blood glucose elevated 08/02/2014   Obstructive sleep apnea on CPAP 08/02/2014   Vitamin D deficiency 08/02/2014   Vitiligo 08/02/2014    Past Surgical History:  Procedure Laterality Date   Beam Radiation     Prostate using Photons   COLONOSCOPY WITH PROPOFOL N/A 09/18/2020   Procedure: COLONOSCOPY WITH PROPOFOL;  Surgeon: Virgel Manifold, MD;  Location: [facility] ENDOSCOPY;  Service: Endoscopy;  Laterality: N/A;   ILIAC VEIN ANGIOPLASTY / STENTING     LEFT HEART CATH  01/11/2012   LOWER EXTREMITY ANGIOGRAPHY Left 10/18/2020   Procedure: LOWER EXTREMITY ANGIOGRAPHY;  Surgeon: Algernon Huxley, MD;  Location: [place] CV LAB;  Service: Cardiovascular;  Laterality: Left;   NEPHRECTOMY Right 2010   Partial    Family History  Problem Relation Age of Onset   Hypertension Mother    Lung cancer Father    Hypertension Father    Hyperlipidemia Father    Diabetes Sister        oldest sister   Hypertension Sister    Hypertension Sister     Social History   Tobacco Use   Smoking status: Former    Packs/day: 0.50    Years: 20.00    Pack years: 10.00    Types: Cigarettes    Quit date: 01/12/2012    Years since quitting: 9.0   Smokeless tobacco: Never  Substance Use Topics   Alcohol use: Yes    Alcohol/week: 2.0  standard drinks    Types: 2 Shots of liquor per week    Comment: Minimal alcohol consumption     Current Outpatient Medications:    amLODipine-olmesartan (AZOR) 10-40 MG tablet, Take 1 tablet by mouth daily., Disp: 90 tablet, Rfl: 1   aspirin 81 MG tablet, Take 1 tablet by mouth daily., Disp: , Rfl:    atorvastatin (LIPITOR) 80 MG tablet, Take 1 tablet (80 mg total) by mouth daily., Disp: 90 tablet, Rfl: 1   carvedilol (COREG) 12.5 MG tablet, Take 1 tablet (12.5 mg total) by mouth 2 (two) times daily., Disp: 180 tablet, Rfl: 1   cholecalciferol (VITAMIN D) 1000 UNITS tablet, Take 1 tablet by mouth daily., Disp: , Rfl:    clopidogrel (PLAVIX) 75 MG tablet, Take 1 tablet (75 mg total) by mouth daily., Disp: 90 tablet, Rfl: 1   COVID-19 mRNA bivalent vaccine, Pfizer, (PFIZER COVID-19 VAC BIVALENT) injection, Inject into the muscle., Disp: 0.3 mL, Rfl: 0   COVID-19 mRNA Vac-TriS, Pfizer, (PFIZER-BIONT COVID-19 VAC-TRIS) SUSP injection, Inject into the muscle., Disp: 0.3 mL, Rfl: 0   nitroGLYCERIN (NITROSTAT) 0.4 MG SL tablet, Place 1 tablet (0.4 mg total) under the tongue daily., Disp: 30 tablet, Rfl: 0   sildenafil (VIAGRA) 50 MG tablet, Take by mouth., Disp: , Rfl:   Allergies  Allergen Reactions   Lisinopril Swelling    Swelling around mouth, lips    I personally reviewed active problem list, medication list, allergies, family history, social history, health maintenance with the patient/caregiver today.   ROS  Constitutional: Negative for fever or weight change.  Respiratory: Negative for cough and shortness of breath.   Cardiovascular: Negative for chest pain or palpitations.  Gastrointestinal: Negative for abdominal pain, no bowel changes.  Musculoskeletal: Negative for gait problem or joint swelling.  Skin: Negative for rash.  Neurological: Negative for dizziness or headache.  No other specific complaints in a complete review of systems (except as listed in HPI above).    Objective  Vitals:   01/11/21 1013  BP: 120/74  Pulse: 73  Resp: 16  Temp: 98.5 F (36.9 C)  TempSrc: Oral  SpO2: 98%  Weight: 202 lb 14.4 oz (92 kg)  Height: 6' (1.829 m)    Body mass index is 27.52 kg/m.  Physical Exam  Constitutional: Patient appears well-developed and well-nourished. Overweight.  No distress.  HEENT: head atraumatic, normocephalic, pupils equal and reactive to light, neck supple Cardiovascular: Normal rate, regular rhythm and normal heart sounds.  No murmur heard. No BLE edema. Pulmonary/Chest: Effort normal and breath sounds normal. No respiratory distress. Abdominal: Soft.  There is no tenderness. Psychiatric: Patient has a normal mood and affect.  behavior is normal. Judgment and thought content normal.   Recent Results (from the past 2160 hour(s))  BUN     Status: None   Collection Time: 10/18/20 10:44 AM  Result Value Ref Range   BUN 17 6 - 20 mg/dL    Comment: Performed at [facility], [address] [place], [place] [address]  Creatinine, serum     Status: None   Collection Time: 10/18/20 10:44 AM  Result Value Ref Range   Creatinine, Ser 0.92 0.61 - 1.24 mg/dL   GFR, Estimated >60 >60 mL/min    Comment: (NOTE) Calculated using the CKD-EPI Creatinine Equation (2021) Performed at [facility], [address] [place], [place] [address]   ECHOCARDIOGRAM COMPLETE     Status: None   Collection Time: 10/23/20  9:09 AM  Result Value Ref Range   AR max vel 3.13 cm2   AV Peak grad 8.5 mmHg   Ao pk vel 1.46 m/s   S' Lateral 3.30 cm   Area-P 1/2 3.63 cm2   AV Area VTI 3.01 cm2   AV Mean grad 4.0 mmHg   Single Plane A4C EF 63.8 %   Single Plane A2C EF 52.9 %   Calc EF 57.7 %   AV Area mean vel 3.04 cm2    PHQ2/9: Depression screen [facility] 2/9 01/11/2021 07/31/2020 01/11/2020 07/28/2019 01/25/2019  Decreased Interest 0 0 0 0 0  Down, Depressed, Hopeless 0 0 0 0 0  PHQ - 2 Score 0 0 0 0 0  Altered sleeping 0 - - 0 0   Tired, decreased energy 0 - - 0 0  Change in appetite 0 - - 0 0  Feeling bad or failure about yourself  0 - - 0 0  Trouble concentrating 0 - - 0 0  Moving slowly or fidgety/restless 0 - - 0 0  Suicidal thoughts 0 - - 0 0  PHQ-9 Score 0 - - 0 0  Difficult doing work/chores Not difficult at all - - - -    phq 9 is negative   Fall Risk: Fall Risk  01/11/2021 07/31/2020 01/11/2020 07/28/2019 01/25/2019  Falls in the past year? - 0 0 0 0  Number falls in past yr: 0 0 0 0 0  Injury with Fall? 0 0 0 0 0  Risk for fall due to : No Fall Risks - - - -  Follow up Falls prevention discussed - - - -      Functional Status Survey: Is the patient deaf or have difficulty hearing?: No Does the patient have difficulty seeing, even when wearing glasses/contacts?: No Does the patient have difficulty concentrating, remembering, or making decisions?: No Does the patient have difficulty walking or climbing stairs?: No Does the patient have difficulty dressing or bathing?: No Does the patient have difficulty doing errands alone such as visiting a doctor's office or shopping?: No    Assessment & Plan  1. Aneurysm of right iliac artery (HCC)  Continue follow up  2. Atherosclerosis of aorta ([place])   3. Peripheral vascular disease ([place])  She has follow with Dr. Lucky Cowboy in January  4. Vitamin D deficiency   5. Need for influenza vaccination  - Flu Vaccine QUAD 6+ mos PF IM (Fluarix Quad PF)  6. Benign essential HTN  At goal, continue medication   7. Dyslipidemia   8. Obstructive sleep apnea on CPAP   9. Dysmetabolic syndrome   10. ED (erectile dysfunction) of organic origin

## 2021-01-11 ENCOUNTER — Ambulatory Visit (INDEPENDENT_AMBULATORY_CARE_PROVIDER_SITE_OTHER): Payer: Federal, State, Local not specified - PPO | Admitting: Family Medicine

## 2021-01-11 ENCOUNTER — Encounter: Payer: Self-pay | Admitting: Family Medicine

## 2021-01-11 ENCOUNTER — Other Ambulatory Visit: Payer: Self-pay

## 2021-01-11 VITALS — BP 120/74 | HR 73 | Temp 98.5°F | Resp 16 | Ht 72.0 in | Wt 202.9 lb

## 2021-01-11 DIAGNOSIS — I723 Aneurysm of iliac artery: Secondary | ICD-10-CM

## 2021-01-11 DIAGNOSIS — Z23 Encounter for immunization: Secondary | ICD-10-CM

## 2021-01-11 DIAGNOSIS — I739 Peripheral vascular disease, unspecified: Secondary | ICD-10-CM | POA: Diagnosis not present

## 2021-01-11 DIAGNOSIS — N529 Male erectile dysfunction, unspecified: Secondary | ICD-10-CM

## 2021-01-11 DIAGNOSIS — E8881 Metabolic syndrome: Secondary | ICD-10-CM

## 2021-01-11 DIAGNOSIS — E559 Vitamin D deficiency, unspecified: Secondary | ICD-10-CM | POA: Diagnosis not present

## 2021-01-11 DIAGNOSIS — I700 Atherosclerosis of aorta: Secondary | ICD-10-CM | POA: Diagnosis not present

## 2021-01-11 DIAGNOSIS — G4733 Obstructive sleep apnea (adult) (pediatric): Secondary | ICD-10-CM

## 2021-01-11 DIAGNOSIS — E785 Hyperlipidemia, unspecified: Secondary | ICD-10-CM

## 2021-01-11 DIAGNOSIS — Z9989 Dependence on other enabling machines and devices: Secondary | ICD-10-CM

## 2021-01-11 DIAGNOSIS — I10 Essential (primary) hypertension: Secondary | ICD-10-CM

## 2021-01-19 ENCOUNTER — Other Ambulatory Visit: Payer: Self-pay | Admitting: Family Medicine

## 2021-01-19 DIAGNOSIS — I10 Essential (primary) hypertension: Secondary | ICD-10-CM

## 2021-01-20 NOTE — Telephone Encounter (Signed)
Requested Prescriptions  Pending Prescriptions Disp Refills   amLODipine-olmesartan (AZOR) 10-40 MG tablet [Pharmacy Med Name: AMLODIPINE/OLMES MEDOXOM10-40MG  T] 90 tablet 0    Sig: TAKE 1 TABLET BY MOUTH DAILY     Cardiovascular: CCB + ARB Combos Passed - 01/19/2021 11:29 AM      Passed - K in normal range and within 180 days    Potassium  Date Value Ref Range Status  07/31/2020 4.1 3.5 - 5.3 mmol/L Final  05/17/2013 4.0 3.5 - 5.1 mmol/L Final         Passed - Cr in normal range and within 180 days    Creat  Date Value Ref Range Status  07/31/2020 1.04 0.70 - 1.25 mg/dL Final    Comment:    For patients >64 years of age, the reference limit for Creatinine is approximately 13% higher for people identified as African-American. .    Creatinine, Ser  Date Value Ref Range Status  10/18/2020 0.92 0.61 - 1.24 mg/dL Final   Creatinine, Urine  Date Value Ref Range Status  07/31/2020 263 20 - 320 mg/dL Final         Passed - Patient is not pregnant      Passed - Last BP in normal range    BP Readings from Last 1 Encounters:  01/11/21 120/74         Passed - Valid encounter within last 6 months    Recent Outpatient Visits          1 week ago Aneurysm of right iliac artery [facility])   [facility] Steele Sizer, MD   5 months ago Well adult exam   [facility] Steele Sizer, MD   1 year ago Aneurysm of right iliac artery [facility])   [facility] Steele Sizer, MD   1 year ago Well adult exam   [facility] Steele Sizer, MD   1 year ago Peripheral vascular disease [facility])   [facility] Steele Sizer, MD      Future Appointments            In 5 months Agbor-Etang, Aaron Edelman, MD [facility], [facility]   In 6 months Steele Sizer, MD [facility], [facility]

## 2021-02-08 ENCOUNTER — Ambulatory Visit (INDEPENDENT_AMBULATORY_CARE_PROVIDER_SITE_OTHER): Payer: Federal, State, Local not specified - PPO | Admitting: Vascular Surgery

## 2021-02-08 ENCOUNTER — Ambulatory Visit (INDEPENDENT_AMBULATORY_CARE_PROVIDER_SITE_OTHER): Payer: Federal, State, Local not specified - PPO

## 2021-02-08 ENCOUNTER — Other Ambulatory Visit: Payer: Self-pay

## 2021-02-08 ENCOUNTER — Encounter (INDEPENDENT_AMBULATORY_CARE_PROVIDER_SITE_OTHER): Payer: Self-pay | Admitting: Vascular Surgery

## 2021-02-08 VITALS — BP 132/82 | HR 60 | Resp 16 | Wt 199.0 lb

## 2021-02-08 DIAGNOSIS — E785 Hyperlipidemia, unspecified: Secondary | ICD-10-CM

## 2021-02-08 DIAGNOSIS — I70212 Atherosclerosis of native arteries of extremities with intermittent claudication, left leg: Secondary | ICD-10-CM

## 2021-02-08 DIAGNOSIS — I70219 Atherosclerosis of native arteries of extremities with intermittent claudication, unspecified extremity: Secondary | ICD-10-CM | POA: Insufficient documentation

## 2021-02-08 DIAGNOSIS — I10 Essential (primary) hypertension: Secondary | ICD-10-CM | POA: Diagnosis not present

## 2021-02-08 DIAGNOSIS — I739 Peripheral vascular disease, unspecified: Secondary | ICD-10-CM | POA: Diagnosis not present

## 2021-02-08 NOTE — Assessment & Plan Note (Signed)
lipid control important in reducing the progression of atherosclerotic disease. Continue statin therapy  

## 2021-02-08 NOTE — Progress Notes (Signed)
MRN : [id number]  Roberto Walker is a 61 y.o. (09-09-1960) male who presents with chief complaint of  Chief Complaint  Patient presents with   Follow-up    Ultrasound follow up  .  History of Present Illness: Patient returns today in follow up of his PAD.  He is doing well.  He can notice a difference immediately after his procedure in terms of his left leg claudication being markedly improved.  He says his legs are fine.  He is very pleased.  He had no periprocedural complications.  His ABIs today are completely normal at 1.21 on the right and 1.25 on the left with triphasic waveforms and normal digital pressures bilaterally.  Current Outpatient Medications  Medication Sig Dispense Refill   amLODipine-olmesartan (AZOR) 10-40 MG tablet TAKE 1 TABLET BY MOUTH DAILY 90 tablet 0   aspirin 81 MG tablet Take 1 tablet by mouth daily.     atorvastatin (LIPITOR) 80 MG tablet Take 1 tablet (80 mg total) by mouth daily. 90 tablet 1   carvedilol (COREG) 12.5 MG tablet Take 1 tablet (12.5 mg total) by mouth 2 (two) times daily. 180 tablet 1   cholecalciferol (VITAMIN D) 1000 UNITS tablet Take 1 tablet by mouth daily.     clopidogrel (PLAVIX) 75 MG tablet Take 1 tablet (75 mg total) by mouth daily. 90 tablet 1   nitroGLYCERIN (NITROSTAT) 0.4 MG SL tablet Place 1 tablet (0.4 mg total) under the tongue daily. 30 tablet 0   sildenafil (VIAGRA) 50 MG tablet Take by mouth.     No current facility-administered medications for this visit.    Past Medical History:  Diagnosis Date   Hypercalcemia    Hyperlipidemia    Hypertension    Metabolic syndrome    Microscopic hematuria    Osteoarthrosis    Peripheral vascular disease ([place])    Prostate cancer ([place])    Renal cell carcinoma ([place])    Vitamin D deficiency    Vitiligo     Past Surgical History:  Procedure Laterality Date   Beam Radiation     Prostate using Photons   COLONOSCOPY WITH PROPOFOL N/A 09/18/2020   Procedure: COLONOSCOPY WITH  PROPOFOL;  Surgeon: Virgel Manifold, MD;  Location: [facility]COPY;  Service: Endoscopy;  Laterality: N/A;   ILIAC VEIN ANGIOPLASTY / STENTING     LEFT HEART CATH  01/11/2012   LOWER EXTREMITY ANGIOGRAPHY Left 10/18/2020   Procedure: LOWER EXTREMITY ANGIOGRAPHY;  Surgeon: Algernon Huxley, MD;  Location: [facility];  Service: Cardiovascular;  Laterality: Left;   NEPHRECTOMY Right 2010   Partial     Social History   Tobacco Use   Smoking status: Former    Packs/day: 0.50    Years: 20.00    Pack years: 10.00    Types: Cigarettes    Quit date: 01/12/2012    Years since quitting: 9.0   Smokeless tobacco: Never  Vaping Use   Vaping Use: Never used  Substance Use Topics   Alcohol use: Yes    Alcohol/week: 2.0 standard drinks    Types: 2 Shots of liquor per week    Comment: Minimal alcohol consumption   Drug use: No      Family History  Problem Relation Age of Onset   Hypertension Mother    Lung cancer Father    Hypertension Father    Hyperlipidemia Father    Diabetes Sister        oldest sister   Hypertension  Sister    Hypertension Sister      Allergies  Allergen Reactions   Lisinopril Swelling    Swelling around mouth, lips     REVIEW OF SYSTEMS (Negative unless checked)  Constitutional: [] Weight loss  [] Fever  [] Chills Cardiac: [] Chest pain   [] Chest pressure   [] Palpitations   [] Shortness of breath when laying flat   [] Shortness of breath at rest   [] Shortness of breath with exertion. Vascular:  [x] Pain in legs with walking   [] Pain in legs at rest   [] Pain in legs when laying flat   [x] Claudication   [] Pain in feet when walking  [] Pain in feet at rest  [] Pain in feet when laying flat   [] History of DVT   [] Phlebitis   [] Swelling in legs   [] Varicose veins   [] Non-healing ulcers Pulmonary:   [] Uses home oxygen   [] Productive cough   [] Hemoptysis   [] Wheeze  [] COPD   [] Asthma Neurologic:  [] Dizziness  [] Blackouts   [] Seizures   [] History of stroke    [] History of TIA  [] Aphasia   [] Temporary blindness   [] Dysphagia   [] Weakness or numbness in arms   [] Weakness or numbness in legs Musculoskeletal:  [x] Arthritis   [] Joint swelling   [] Joint pain   [] Low back pain Hematologic:  [] Easy bruising  [] Easy bleeding   [] Hypercoagulable state   [] Anemic   Gastrointestinal:  [] Blood in stool   [] Vomiting blood  [] Gastroesophageal reflux/heartburn   [] Abdominal pain Genitourinary:  [] Chronic kidney disease   [] Difficult urination  [x] Frequent urination  [] Burning with urination   [] Hematuria Skin:  [] Rashes   [] Ulcers   [] Wounds Psychological:  [] History of anxiety   []  History of major depression.  Physical Examination  BP 132/82 (BP Location: Right Arm)    Pulse 60    Resp 16    Wt 199 lb (90.3 kg)    BMI 26.99 kg/m  Gen:  WD/WN, NAD Head: [place]/AT, No temporalis wasting. Ear/Nose/Throat: Hearing grossly intact, nares w/o erythema or drainage Eyes: Conjunctiva clear. Sclera non-icteric Neck: Supple.  Trachea midline Pulmonary:  Good air movement, no use of accessory muscles.  Cardiac: RRR, no JVD Vascular:  Vessel Right Left  Radial Palpable Palpable                          PT Palpable Palpable  DP Palpable Palpable   Gastrointestinal: soft, non-tender/non-distended. No guarding/reflex.  Musculoskeletal: M/S 5/5 throughout.  No deformity or atrophy. No edema. Neurologic: Sensation grossly intact in extremities.  Symmetrical.  Speech is fluent.  Psychiatric: Judgment intact, Mood & affect appropriate for pt's clinical situation. Dermatologic: No rashes or ulcers noted.  No cellulitis or open wounds.  Vitiligo present      Labs No results found for this or any previous visit (from the past 2160 hour(s)).  Radiology No results found.  Assessment/Plan  Benign essential HTN blood pressure control important in reducing the progression of atherosclerotic disease. On appropriate oral medications.   Dyslipidemia lipid control  important in reducing the progression of atherosclerotic disease. Continue statin therapy   Atherosclerosis of native arteries of extremity with intermittent claudication (HCC) His ABIs today are completely normal at 1.21 on the right and 1.25 on the left with triphasic waveforms and normal digital pressures bilaterally.  Doing much better clinically.  Plan a follow-up in 6 months with noninvasive studies.  Continue current medical regimen.    Leotis Pain, MD  02/08/2021 11:40 AM  This note was created with Dragon medical transcription system.  Any errors from dictation are purely unintentional

## 2021-02-08 NOTE — Assessment & Plan Note (Signed)
His ABIs today are completely normal at 1.21 on the right and 1.25 on the left with triphasic waveforms and normal digital pressures bilaterally.  Doing much better clinically.  Plan a follow-up in 6 months with noninvasive studies.  Continue current medical regimen.

## 2021-02-08 NOTE — Assessment & Plan Note (Signed)
blood pressure control important in reducing the progression of atherosclerotic disease. On appropriate oral medications.  

## 2021-04-28 ENCOUNTER — Other Ambulatory Visit: Payer: Self-pay | Admitting: Family Medicine

## 2021-04-28 DIAGNOSIS — I10 Essential (primary) hypertension: Secondary | ICD-10-CM

## 2021-05-02 ENCOUNTER — Other Ambulatory Visit: Payer: Self-pay | Admitting: Family Medicine

## 2021-05-02 DIAGNOSIS — I10 Essential (primary) hypertension: Secondary | ICD-10-CM

## 2021-06-25 NOTE — Progress Notes (Unsigned)
Name: Roberto Walker   MRN: [id number]    DOB: 10-26-1960   Date:06/26/2021       Progress Note  Subjective  Chief Complaint  Surgery Clearance  HPI  Pre-op: he will have dental extractions and implants done one 08/01/2021 and needs a Pre op clearance. He has a history of heart disease but no heart attacks in the past 10 years, he has PAD and since stent on left leg he is doing well, good exercise tolerance. No previous complications for anesthesia.   HTN: bp has been well controlled .Marland Kitchen He denies side effects of medications, denies decrease in exercise tolerance , chest pain, palpitation or dizziness.    History of CAD: cad S/P DES to LAD 01/11/2012, he also has 80% RCA disease who presents for f/u, doing well last EF 50 % in 2019 Taking statin therapy, aspirin and plavix daily He works out three days a week at his home gym and works outside the other days   Hyperlipidemia: taking Atorvastatin that he gets through the [place], and is compliant , denies myalgia. Labs were  reviewed and LDL was 55, HDL was 56 . Continue current regiment    OSA: he stopped wearing his CPAP since he retired in Feb 2022  No headaches in the mornings.He states he could not tolerate it, wife states he is no longer snoring    History of kidney and prostate cancer : he is going to [facility] to check on prostate once every 12  months. S/p radiation . He was seeing Urologist at [facility] but is now going to [place], last PSA June 2022 was normal, because of some hematuria he had repeat CT in 09/2019 showed some cysts, advised to have cystoscopy but he declined the procedure. He denies any hematuria , no flank pain, fever     Aneurysm of right common iliac : incidental finding on CT done for hematuria , discussed need for yearly follow up with Dr. Lucky Cowboy - he has an appointment coming up He is on statin therapy    PVD: sees Dr. Lucky Cowboy once a year,he developed claudication Summer 2022 had a stent placed and is doing well since [place]  on Plavix and statin therapy    Pre-diabetes: he has been following life style modification such as exercise and healthy diet. He denies polyphagia, polydipsia or polyuria. He is eats a balanced diet, grains, fish, chicken, vegetables. Last A1C was up again from 5.6 % to 5.8 % We will recheck labs.    ED: he has difficulty initiating and maintaining erection since prostate radiation therapy, he is now on Viagra, getting it through the [place] , doing well    Patient Active Problem List   Diagnosis Date Noted   Atherosclerosis of native arteries of extremity with intermittent claudication ([place]) 02/08/2021   History of colonic polyps    Polyp of colon    Atherosclerosis of aorta ([place]) 07/31/2020   Personal history of kidney cancer 02/06/2015   Benign essential HTN 08/02/2014   Coronary artery disease involving native coronary artery of native heart without angina pectoris 08/02/2014   Dyslipidemia 08/02/2014   H/O acute myocardial infarction 08/02/2014   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   Impotence of organic origin 88/41/6606   Dysmetabolic syndrome 30/16/0109   Arthritis, degenerative 08/02/2014   Peripheral vascular disease ([place]) 08/02/2014   Blood glucose elevated 08/02/2014   Obstructive sleep apnea on CPAP 08/02/2014   Vitamin D deficiency 08/02/2014   Vitiligo 08/02/2014    Past  Surgical History:  Procedure Laterality Date   Beam Radiation     Prostate using Photons   COLONOSCOPY WITH PROPOFOL N/A 09/18/2020   Procedure: COLONOSCOPY WITH PROPOFOL;  Surgeon: Virgel Manifold, MD;  Location: [facility] ENDOSCOPY;  Service: Endoscopy;  Laterality: N/A;   ILIAC VEIN ANGIOPLASTY / STENTING     LEFT HEART CATH  01/11/2012   LOWER EXTREMITY ANGIOGRAPHY Left 10/18/2020   Procedure: LOWER EXTREMITY ANGIOGRAPHY;  Surgeon: Algernon Huxley, MD;  Location: [facility];  Service: Cardiovascular;  Laterality: Left;   NEPHRECTOMY Right 2010   Partial    Family History  Problem Relation Age  of Onset   Hypertension Mother    Lung cancer Father    Hypertension Father    Hyperlipidemia Father    Diabetes Sister        oldest sister   Hypertension Sister    Hypertension Sister     Social History   Tobacco Use   Smoking status: Former    Packs/day: 0.50    Years: 20.00    Pack years: 10.00    Types: Cigarettes    Quit date: 01/12/2012    Years since quitting: 9.4   Smokeless tobacco: Never  Substance Use Topics   Alcohol use: Yes    Alcohol/week: 2.0 standard drinks    Types: 2 Shots of liquor per week    Comment: Minimal alcohol consumption     Current Outpatient Medications:    amLODipine-olmesartan (AZOR) 10-40 MG tablet, TAKE 1 TABLET BY MOUTH DAILY, Disp: 90 tablet, Rfl: 0   aspirin 81 MG tablet, Take 1 tablet by mouth daily., Disp: , Rfl:    atorvastatin (LIPITOR) 80 MG tablet, Take 1 tablet (80 mg total) by mouth daily., Disp: 90 tablet, Rfl: 1   carvedilol (COREG) 12.5 MG tablet, Take 1 tablet (12.5 mg total) by mouth 2 (two) times daily., Disp: 180 tablet, Rfl: 1   cholecalciferol (VITAMIN D) 1000 UNITS tablet, Take 1 tablet by mouth daily., Disp: , Rfl:    clopidogrel (PLAVIX) 75 MG tablet, Take 1 tablet (75 mg total) by mouth daily., Disp: 90 tablet, Rfl: 1   nitroGLYCERIN (NITROSTAT) 0.4 MG SL tablet, Place 1 tablet (0.4 mg total) under the tongue daily., Disp: 30 tablet, Rfl: 0   sildenafil (VIAGRA) 50 MG tablet, Take by mouth., Disp: , Rfl:   Allergies  Allergen Reactions   Lisinopril Swelling    Swelling around mouth, lips    I personally reviewed active problem list, medication list, allergies, family history, social history, health maintenance with the patient/caregiver today.   ROS  Constitutional: Negative for fever or weight change.  Respiratory: Negative for cough and shortness of breath.   Cardiovascular: Negative for chest pain or palpitations.  Gastrointestinal: Negative for abdominal pain, no bowel changes.  Musculoskeletal:  Negative for gait problem or joint swelling.  Skin: Negative for rash.  Neurological: Negative for dizziness or headache.  No other specific complaints in a complete review of systems (except as listed in HPI above).   Objective  Vitals:   06/26/21 1528  BP: 120/76  Pulse: 75  Resp: 16  SpO2: 99%  Weight: 193 lb (87.5 kg)  Height: 6' (1.829 m)    Body mass index is 26.18 kg/m.  Physical Exam  Constitutional: Patient appears well-developed and well-nourished.  No distress.  HEENT: head atraumatic, normocephalic, pupils equal and reactive to light, neck supple Cardiovascular: Normal rate, regular rhythm and normal heart sounds.  No murmur  heard. No BLE edema. Pulmonary/Chest: Effort normal and breath sounds normal. No respiratory distress. Abdominal: Soft.  There is no tenderness. Psychiatric: Patient has a normal mood and affect. behavior is normal. Judgment and thought content normal.   PHQ2/9:    06/26/2021    3:28 PM 01/11/2021   10:06 AM 07/31/2020   10:01 AM 01/11/2020    2:50 PM 07/28/2019    8:00 AM  Depression screen PHQ 2/9  Decreased Interest 0 0 0 0 0  Down, Depressed, Hopeless 0 0 0 0 0  PHQ - 2 Score 0 0 0 0 0  Altered sleeping  0   0  Tired, decreased energy  0   0  Change in appetite  0   0  Feeling bad or failure about yourself   0   0  Trouble concentrating  0   0  Moving slowly or fidgety/restless  0   0  Suicidal thoughts  0   0  PHQ-9 Score  0   0  Difficult doing work/chores  Not difficult at all       phq 9 is negative   Fall Risk:    06/26/2021    3:28 PM 01/11/2021   10:06 AM 07/31/2020   10:01 AM 01/11/2020    2:50 PM 07/28/2019    8:00 AM  Fall Risk   Falls in the past year? 0  0 0 0  Number falls in past yr: 0 0 0 0 0  Injury with Fall? 0 0 0 0 0  Risk for fall due to : No Fall Risks No Fall Risks     Follow up Falls prevention discussed Falls prevention discussed         Functional Status Survey: Is the patient deaf or have  difficulty hearing?: No Does the patient have difficulty seeing, even when wearing glasses/contacts?: No Does the patient have difficulty concentrating, remembering, or making decisions?: No Does the patient have difficulty walking or climbing stairs?: No Does the patient have difficulty dressing or bathing?: No Does the patient have difficulty doing errands alone such as visiting a doctor's office or shopping?: No    Assessment & Plan  Problem List Items Addressed This Visit     Aneurysm of right iliac artery (HCC)   Relevant Medications   amLODipine-olmesartan (AZOR) 10-40 MG tablet   Benign essential HTN   Relevant Medications   amLODipine-olmesartan (AZOR) 10-40 MG tablet   Other Relevant Orders   CBC with Differential/Platelet   COMPLETE METABOLIC PANEL WITH GFR   Dyslipidemia   Relevant Orders   Lipid panel   Atherosclerosis of aorta (HCC)   Relevant Medications   amLODipine-olmesartan (AZOR) 10-40 MG tablet   Coronary artery disease involving native coronary artery of native heart without angina pectoris   Relevant Medications   amLODipine-olmesartan (AZOR) 10-40 MG tablet   Peripheral vascular disease ([place]) - Primary   Relevant Medications   amLODipine-olmesartan (AZOR) 10-40 MG tablet   Personal history of kidney cancer   Relevant Orders   Urinalysis, Complete   H/O acute myocardial infarction   Impotence of organic origin   Dysmetabolic syndrome   Relevant Orders   Hemoglobin A1c   Blood glucose elevated   Relevant Orders   Hemoglobin A1c   Vitamin D deficiency   Relevant Orders   VITAMIN D 25 Hydroxy (Vit-D Deficiency, Fractures)   Other Visit Diagnoses     History of prostate cancer       Relevant Orders  PSA       May proceed with teeth extraction without further testing, discussed taking cpap for post-op in case he will be in observation

## 2021-06-26 ENCOUNTER — Ambulatory Visit (INDEPENDENT_AMBULATORY_CARE_PROVIDER_SITE_OTHER): Payer: Federal, State, Local not specified - PPO | Admitting: Family Medicine

## 2021-06-26 ENCOUNTER — Encounter: Payer: Self-pay | Admitting: Family Medicine

## 2021-06-26 VITALS — BP 120/76 | HR 75 | Resp 16 | Ht 72.0 in | Wt 193.0 lb

## 2021-06-26 DIAGNOSIS — Z85528 Personal history of other malignant neoplasm of kidney: Secondary | ICD-10-CM

## 2021-06-26 DIAGNOSIS — E8881 Metabolic syndrome: Secondary | ICD-10-CM

## 2021-06-26 DIAGNOSIS — I739 Peripheral vascular disease, unspecified: Secondary | ICD-10-CM | POA: Diagnosis not present

## 2021-06-26 DIAGNOSIS — R739 Hyperglycemia, unspecified: Secondary | ICD-10-CM

## 2021-06-26 DIAGNOSIS — Z8546 Personal history of malignant neoplasm of prostate: Secondary | ICD-10-CM

## 2021-06-26 DIAGNOSIS — I10 Essential (primary) hypertension: Secondary | ICD-10-CM

## 2021-06-26 DIAGNOSIS — N529 Male erectile dysfunction, unspecified: Secondary | ICD-10-CM

## 2021-06-26 DIAGNOSIS — E785 Hyperlipidemia, unspecified: Secondary | ICD-10-CM

## 2021-06-26 DIAGNOSIS — I700 Atherosclerosis of aorta: Secondary | ICD-10-CM

## 2021-06-26 DIAGNOSIS — I723 Aneurysm of iliac artery: Secondary | ICD-10-CM

## 2021-06-26 DIAGNOSIS — I252 Old myocardial infarction: Secondary | ICD-10-CM

## 2021-06-26 DIAGNOSIS — E559 Vitamin D deficiency, unspecified: Secondary | ICD-10-CM

## 2021-06-26 DIAGNOSIS — I2510 Atherosclerotic heart disease of native coronary artery without angina pectoris: Secondary | ICD-10-CM

## 2021-06-26 MED ORDER — AMLODIPINE-OLMESARTAN 10-40 MG PO TABS: 1.0000 | ORAL_TABLET | Freq: Every day | ORAL | 1 refills | Status: DC

## 2021-07-04 ENCOUNTER — Encounter: Payer: Self-pay | Admitting: Cardiology

## 2021-07-04 ENCOUNTER — Ambulatory Visit (INDEPENDENT_AMBULATORY_CARE_PROVIDER_SITE_OTHER): Payer: Federal, State, Local not specified - PPO | Admitting: Cardiology

## 2021-07-04 VITALS — BP 136/90 | HR 48 | Ht 72.0 in | Wt 194.8 lb

## 2021-07-04 DIAGNOSIS — Z01810 Encounter for preprocedural cardiovascular examination: Secondary | ICD-10-CM | POA: Diagnosis not present

## 2021-07-04 DIAGNOSIS — I2510 Atherosclerotic heart disease of native coronary artery without angina pectoris: Secondary | ICD-10-CM

## 2021-07-04 DIAGNOSIS — I10 Essential (primary) hypertension: Secondary | ICD-10-CM | POA: Diagnosis not present

## 2021-07-04 DIAGNOSIS — E7800 Pure hypercholesterolemia, unspecified: Secondary | ICD-10-CM | POA: Diagnosis not present

## 2021-07-04 NOTE — Patient Instructions (Signed)
Medication Instructions:   Your physician recommends that you continue on your current medications as directed. Please refer to the Current Medication list given to you today.  *If you need a refill on your cardiac medications before your next appointment, please call your pharmacy*   Lab Work:  None ordered    Testing/Procedures:  None ordered   Follow-Up: At [facility], you and your health needs are our priority.  As part of our continuing mission to provide you with exceptional heart care, we have created designated Provider Care Teams.  These Care Teams include your primary Cardiologist (physician) and Advanced Practice Providers (APPs -  Physician Assistants and Nurse Practitioners) who all work together to provide you with the care you need, when you need it.  We recommend signing up for the patient portal called "MyChart".  Sign up information is provided on this After Visit Summary.  MyChart is used to connect with patients for Virtual Visits (Telemedicine).  Patients are able to view lab/test results, encounter notes, upcoming appointments, etc.  Non-urgent messages can be sent to your provider as well.   To learn more about what you can do with MyChart, go to [email or web address].    Your next appointment:   1 year(s)  The format for your next appointment:   In Person  Provider:   You may see Kate Sable, MD or one of the following Advanced Practice Providers on your designated Care Team:   Murray Hodgkins, NP Christell Faith, PA-C Cadence Kathlen Mody, [place]    Other Instructions   Important Information About Sugar

## 2021-07-04 NOTE — Progress Notes (Signed)
Cardiology Office Note:    Date:  07/04/2021   ID:  Roberto Walker, DOB 08-31-1960, MRN [id number]  PCP:  Steele Sizer, MD   [facility] Providers Cardiologist:  None     Referring MD: Steele Sizer, MD   No chief complaint on file.   History of Present Illness:    Roberto Walker is a 61 y.o. male with a hx of CAD/MI (s/p PCI to LAD 2013, RCA 80%)hypertension, hyperlipidemia, PAD (s/p PCI 2015, right common iliac artery aneurysm), former smoker x20+ years who presents for follow-up.   Being seen due to history of CAD.  Takes medications as prescribed.  Denies chest pain or shortness of breath.  No side effects from medications.  Blood pressures are adequately controlled.  He feels well, has no concerns at this time.  Planning on having dental surgery end of the month.  Wanted to make sure everything is okay.   Prior notes Patient previously followed at [facility] for cardiac care.  Has a history of MI, underwent drug-eluting stent to mid LAD 2013.   Patient has a history of hematuria, underwent an abdominal CT 09/26/2019 showing aortic atherosclerosis, finding of aneurysmal dilation of the right common iliac artery up to 1.7 cm noted.  He has a history of right iliac artery aneurysm, being followed by vascular surgery.  Past Medical History:  Diagnosis Date   Hypercalcemia    Hyperlipidemia    Hypertension    Metabolic syndrome    Microscopic hematuria    Osteoarthrosis    Peripheral vascular disease ([place])    Prostate cancer ([place])    Renal cell carcinoma ([place])    Vitamin D deficiency    Vitiligo     Past Surgical History:  Procedure Laterality Date   Beam Radiation     Prostate using Photons   COLONOSCOPY WITH PROPOFOL N/A 09/18/2020   Procedure: COLONOSCOPY WITH PROPOFOL;  Surgeon: Virgel Manifold, MD;  Location: [facility] ENDOSCOPY;  Service: Endoscopy;  Laterality: N/A;   ILIAC VEIN ANGIOPLASTY / STENTING     LEFT HEART CATH  01/11/2012   LOWER EXTREMITY  ANGIOGRAPHY Left 10/18/2020   Procedure: LOWER EXTREMITY ANGIOGRAPHY;  Surgeon: Algernon Huxley, MD;  Location: [facility];  Service: Cardiovascular;  Laterality: Left;   NEPHRECTOMY Right 2010   Partial    Current Medications: Current Meds  Medication Sig   amLODipine-olmesartan (AZOR) 10-40 MG tablet Take 1 tablet by mouth daily.   aspirin 81 MG tablet Take 1 tablet by mouth daily.   atorvastatin (LIPITOR) 80 MG tablet Take 1 tablet (80 mg total) by mouth daily.   carvedilol (COREG) 12.5 MG tablet Take 1 tablet (12.5 mg total) by mouth 2 (two) times daily.   cholecalciferol (VITAMIN D) 1000 UNITS tablet Take 1 tablet by mouth daily.   clopidogrel (PLAVIX) 75 MG tablet Take 1 tablet (75 mg total) by mouth daily.   nitroGLYCERIN (NITROSTAT) 0.4 MG SL tablet Place 1 tablet (0.4 mg total) under the tongue daily. (Patient taking differently: Place 0.4 mg under the tongue as needed.)   sildenafil (VIAGRA) 50 MG tablet Take by mouth as needed.     Allergies:   Lisinopril   Social History   Socioeconomic History   Marital status: Married    Spouse name: Roberto Walker   Number of children: 2   Years of education: College    Highest education level: Not on file  Occupational History   Occupation: RDU  Tobacco Use  Smoking status: Former    Packs/day: 0.50    Years: 20.00    Pack years: 10.00    Types: Cigarettes    Quit date: 01/12/2012    Years since quitting: 9.4   Smokeless tobacco: Never  Vaping Use   Vaping Use: Never used  Substance and Sexual Activity   Alcohol use: Yes    Alcohol/week: 2.0 standard drinks    Types: 2 Shots of liquor per week    Comment: Minimal alcohol consumption   Drug use: No   Sexual activity: Yes    Partners: Female  Other Topics Concern   Not on file  Social History Narrative   Not on file   Social Determinants of Health   Financial Resource Strain: Low Risk    Difficulty of Paying Living Expenses: Not hard at all  Food Insecurity: No  Food Insecurity   Worried About Charity fundraiser in the Last Year: Never true   [place] in the Last Year: Never true  Transportation Needs: No Transportation Needs   Lack of Transportation (Medical): No   Lack of Transportation (Non-Medical): No  Physical Activity: Sufficiently Active   Days of Exercise per Week: 5 days   Minutes of Exercise per Session: 60 min  Stress: No Stress Concern Present   Feeling of Stress : Not at all  Social Connections: Socially Integrated   Frequency of Communication with Friends and Family: More than three times a week   Frequency of Social Gatherings with Friends and Family: More than three times a week   Attends Religious Services: 1 to 4 times per year   Active Member of [employer] or Organizations: Yes   Attends Music therapist: More than 4 times per year   Marital Status: Married     Family History: The patient's family history includes Diabetes in his sister; Hyperlipidemia in his father; Hypertension in his father, mother, sister, and sister; Lung cancer in his father.  ROS:   Please see the history of present illness.     All other systems reviewed and are negative.  EKGs/Labs/Other Studies Reviewed:    The following studies were reviewed today:   EKG:  EKG is  ordered today.  The ekg ordered today demonstrates sinus bradycardia,  Recent Labs: 07/31/2020: ALT 32; Hemoglobin 14.3; Platelets 286; Potassium 4.1; Sodium 138 10/18/2020: BUN 17; Creatinine, Ser 0.92  Recent Lipid Panel    Component Value Date/Time   CHOL 126 07/31/2020 1050   CHOL 134 02/16/2015 0820   CHOL 118 05/02/2011 0943   TRIG 74 07/31/2020 1050   TRIG 39 05/02/2011 0943   HDL 56 07/31/2020 1050   HDL 48 02/16/2015 0820   HDL 52 05/02/2011 0943   CHOLHDL 2.3 07/31/2020 1050   VLDL 9 06/06/2016 0804   VLDL 8 05/02/2011 0943   LDLCALC 55 07/31/2020 1050   LDLCALC 58 05/02/2011 0943     Risk Assessment/Calculations:          Physical  Exam:    VS:  BP 136/90   Pulse (!) 48   Ht 6' (1.829 m)   Wt 194 lb 12.8 oz (88.4 kg)   SpO2 98%   BMI 26.42 kg/m     Wt Readings from Last 3 Encounters:  07/04/21 194 lb 12.8 oz (88.4 kg)  06/26/21 193 lb (87.5 kg)  02/08/21 199 lb (90.3 kg)     GEN:  Well nourished, well developed in no acute distress HEENT: Normal  NECK: No JVD; No carotid bruits CARDIAC: RRR, no murmurs, rubs, gallops RESPIRATORY:  Clear to auscultation without rales, wheezing or rhonchi  ABDOMEN: Soft, non-tender, non-distended MUSCULOSKELETAL:  No edema; No deformity  SKIN: Warm and dry NEUROLOGIC:  Alert and oriented x 3 PSYCHIATRIC:  Normal affect   ASSESSMENT:    1. Coronary artery disease involving native coronary artery of native heart without angina pectoris   2. Primary hypertension   3. Pure hypercholesterolemia   4. Pre-operative cardiovascular examination     PLAN:    In order of problems listed above:  CAD/MI 2013 PCI to mid LAD.  80% mid RCA.  Denies chest pain.  Continue aspirin, Plavix, Lipitor.  Hypertension, BP controlled.  Continue amlodipine, olmesartan, Coreg. Hyperlipidemia, cholesterol controlled.  Continue Lipitor 80 mg daily. Preop evaluation, dental surgery being planned.  Okay to hold Plavix prior to procedure for duration as deemed necessary by oral surgeon, typically 5 days.  Restart as soon as safely possible after dental procedure.   Follow-up yearly.   Medication Adjustments/Labs and Tests Ordered: Current medicines are reviewed at length with the patient today.  Concerns regarding medicines are outlined above.  Orders Placed This Encounter  Procedures   EKG 12-Lead    No orders of the defined types were placed in this encounter.    Patient Instructions  Medication Instructions:   Your physician recommends that you continue on your current medications as directed. Please refer to the Current Medication list given to you today.  *If you need a refill  on your cardiac medications before your next appointment, please call your pharmacy*   Lab Work:  None ordered    Testing/Procedures:  None ordered   Follow-Up: At [facility], you and your health needs are our priority.  As part of our continuing mission to provide you with exceptional heart care, we have created designated Provider Care Teams.  These Care Teams include your primary Cardiologist (physician) and Advanced Practice Providers (APPs -  Physician Assistants and Nurse Practitioners) who all work together to provide you with the care you need, when you need it.  We recommend signing up for the patient portal called "MyChart".  Sign up information is provided on this After Visit Summary.  MyChart is used to connect with patients for Virtual Visits (Telemedicine).  Patients are able to view lab/test results, encounter notes, upcoming appointments, etc.  Non-urgent messages can be sent to your provider as well.   To learn more about what you can do with MyChart, go to [email or web address].    Your next appointment:   1 year(s)  The format for your next appointment:   In Person  Provider:   You may see Kate Sable, MD or one of the following Advanced Practice Providers on your designated Care Team:   Murray Hodgkins, NP Christell Faith, PA-C Cadence Kathlen Mody, [place]    Other Instructions   Important Information About Sugar         Signed, Kate Sable, MD  07/04/2021 10:02 AM    [place]

## 2021-07-18 DIAGNOSIS — E785 Hyperlipidemia, unspecified: Secondary | ICD-10-CM | POA: Diagnosis not present

## 2021-07-18 DIAGNOSIS — E8881 Metabolic syndrome: Secondary | ICD-10-CM | POA: Diagnosis not present

## 2021-07-18 DIAGNOSIS — I10 Essential (primary) hypertension: Secondary | ICD-10-CM | POA: Diagnosis not present

## 2021-07-18 DIAGNOSIS — R739 Hyperglycemia, unspecified: Secondary | ICD-10-CM | POA: Diagnosis not present

## 2021-07-19 LAB — COMPLETE METABOLIC PANEL WITH GFR
AG Ratio: 1.4 (calc) (ref 1.0–2.5)
ALT: 25 U/L (ref 9–46)
AST: 28 U/L (ref 10–35)
Albumin: 4.2 g/dL (ref 3.6–5.1)
Alkaline phosphatase (APISO): 64 U/L (ref 35–144)
BUN: 11 mg/dL (ref 7–25)
CO2: 21 mmol/L (ref 20–32)
Calcium: 10.3 mg/dL (ref 8.6–10.3)
Chloride: 109 mmol/L (ref 98–110)
Creat: 0.97 mg/dL (ref 0.70–1.35)
Globulin: 3.1 g/dL (calc) (ref 1.9–3.7)
Glucose, Bld: 95 mg/dL (ref 65–99)
Potassium: 3.9 mmol/L (ref 3.5–5.3)
Sodium: 139 mmol/L (ref 135–146)
Total Bilirubin: 0.4 mg/dL (ref 0.2–1.2)
Total Protein: 7.3 g/dL (ref 6.1–8.1)
eGFR: 89 mL/min/{1.73_m2} (ref >=60)

## 2021-07-19 LAB — CBC WITH DIFFERENTIAL/PLATELET
Absolute Monocytes: 537 cells/uL (ref 200–950)
Basophils Absolute: 20 cells/uL (ref 0–200)
Basophils Relative: 0.3 %
Eosinophils Absolute: 88 cells/uL (ref 15–500)
Eosinophils Relative: 1.3 %
HCT: 40.2 % (ref 38.5–50.0)
Hemoglobin: 14 g/dL (ref 13.2–17.1)
Lymphs Abs: 2747 cells/uL (ref 850–3900)
MCH: 34.5 pg — ABNORMAL HIGH (ref 27.0–33.0)
MCHC: 34.8 g/dL (ref 32.0–36.0)
MCV: 99 fL (ref 80.0–100.0)
MPV: 8.9 fL (ref 7.5–12.5)
Monocytes Relative: 7.9 %
Neutro Abs: 3407 cells/uL (ref 1500–7800)
Neutrophils Relative %: 50.1 %
Platelets: 258 10*3/uL (ref 140–400)
RBC: 4.06 10*6/uL — ABNORMAL LOW (ref 4.20–5.80)
RDW: 13.4 % (ref 11.0–15.0)
Total Lymphocyte: 40.4 %
WBC: 6.8 10*3/uL (ref 3.8–10.8)

## 2021-07-19 LAB — URINALYSIS, COMPLETE
Bacteria, UA: NONE SEEN /HPF
Bilirubin Urine: NEGATIVE
Glucose, UA: NEGATIVE
Hyaline Cast: NONE SEEN /LPF
Ketones, ur: NEGATIVE
Leukocytes,Ua: NEGATIVE
Nitrite: NEGATIVE
Protein, ur: NEGATIVE
Specific Gravity, Urine: 1.018 (ref 1.001–1.035)
Squamous Epithelial / HPF: NONE SEEN /HPF (ref <=5)
WBC, UA: NONE SEEN /HPF (ref 0–5)
pH: <=5 (ref 5.0–8.0)

## 2021-07-19 LAB — LIPID PANEL
Cholesterol: 127 mg/dL (ref <200)
HDL: 67 mg/dL (ref >=40)
LDL Cholesterol (Calc): 47 mg/dL (calc)
Non-HDL Cholesterol (Calc): 60 mg/dL (calc) (ref <130)
Total CHOL/HDL Ratio: 1.9 (calc) (ref <5.0)
Triglycerides: 58 mg/dL (ref <150)

## 2021-07-19 LAB — HEMOGLOBIN A1C
Hgb A1c MFr Bld: 5.4 % of total Hgb (ref <5.7)
Mean Plasma Glucose: 108 mg/dL
eAG (mmol/L): 6 mmol/L

## 2021-07-19 LAB — VITAMIN D 25 HYDROXY (VIT D DEFICIENCY, FRACTURES): Vit D, 25-Hydroxy: 40 ng/mL (ref 30–100)

## 2021-07-19 IMAGING — CT CT ABD-PEL WO/W CM
3 of 12 series · 11 of 46 positions shown, 17 images · IV contrast (omnipaque)
Comparison: 08/09/2008

CLINICAL DATA: Hematuria.

EXAM:
CT ABDOMEN AND PELVIS WITHOUT AND WITH CONTRAST
TECHNIQUE: Multidetector CT imaging of the abdomen and pelvis was performed
following the standard protocol before and following the bolus
administration of intravenous contrast.
CONTRAST:  125mL OMNIPAQUE IOHEXOL 300 MG/ML  SOLN

[Series 2: axial pre · axial · non-contrast · 0.78mm/px · z∈[-514,-374]mm · 3 of 100 slices shown]
[im 15/100  soft-tissue]
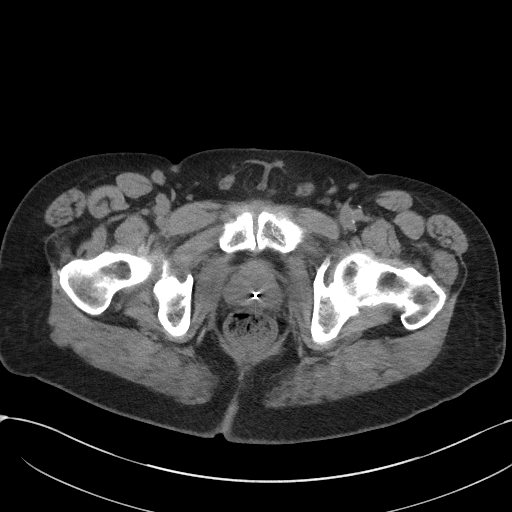
[im 29/100  soft-tissue]
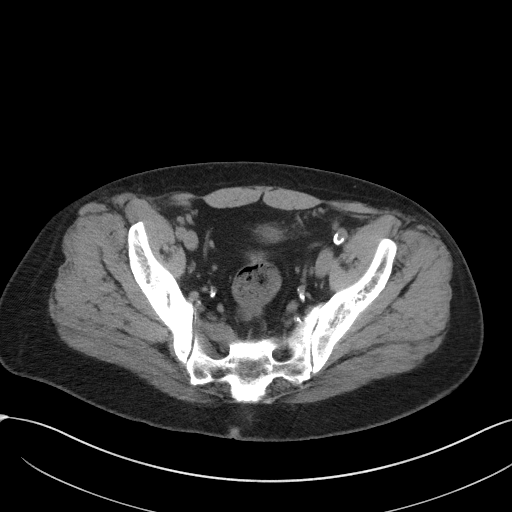
[im 43/100  soft-tissue]
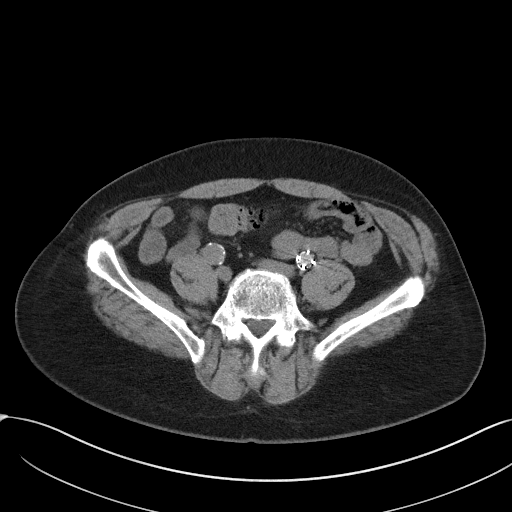

[Series 6: coronal pre · coronal · non-contrast · 0.80mm/px · 2 of 95 slices shown, 3 images]
[im 32/95  soft-tissue]
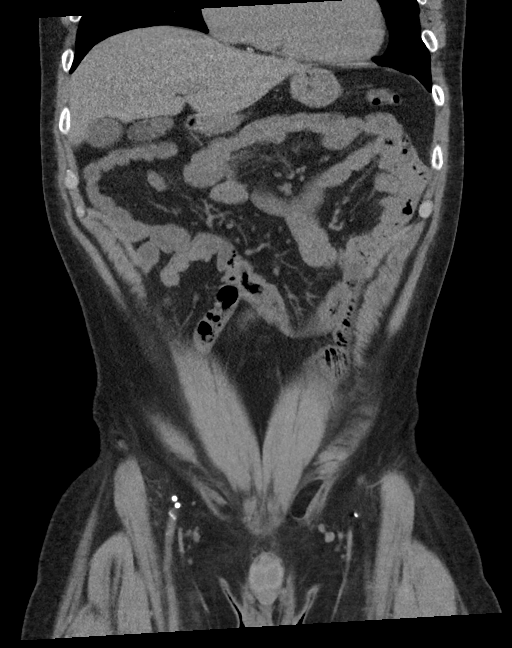
[im 32/95  bone]
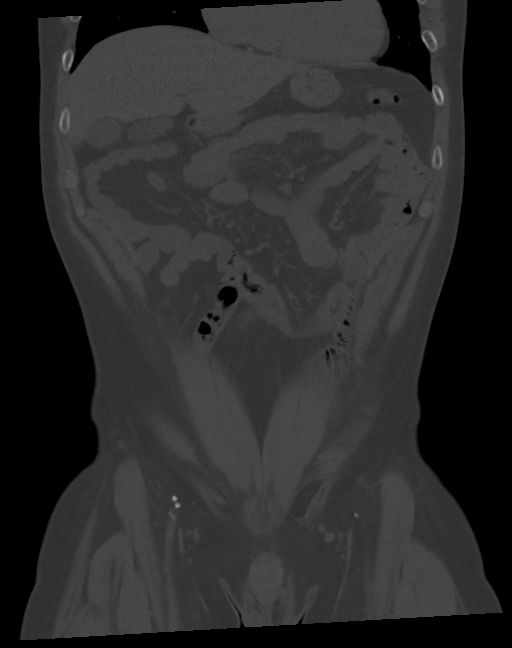
[im 63/95  soft-tissue]
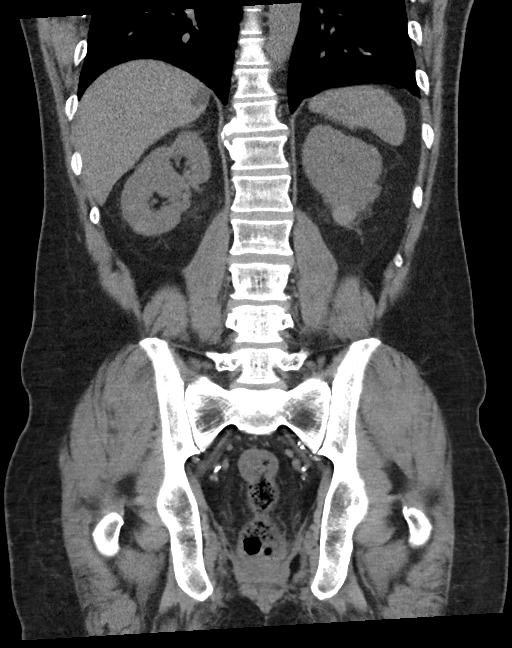

[Series 13: axial delay · axial · delayed · 0.80mm/px · z∈[-638,-268]mm · 6 of 104 slices shown, 11 images]
[im 15/104  soft-tissue]
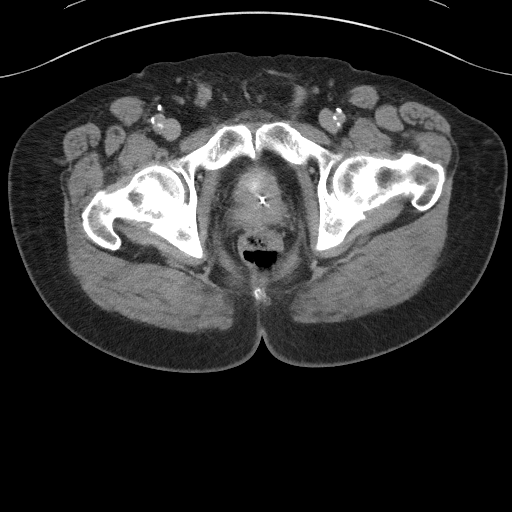
[im 15/104  bone]
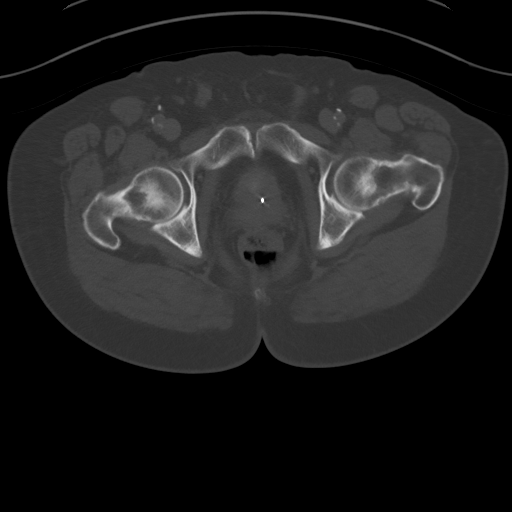
[im 30/104  soft-tissue]
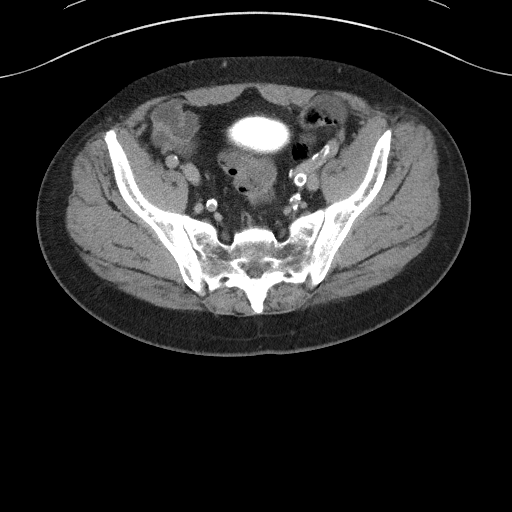
[im 45/104  soft-tissue]
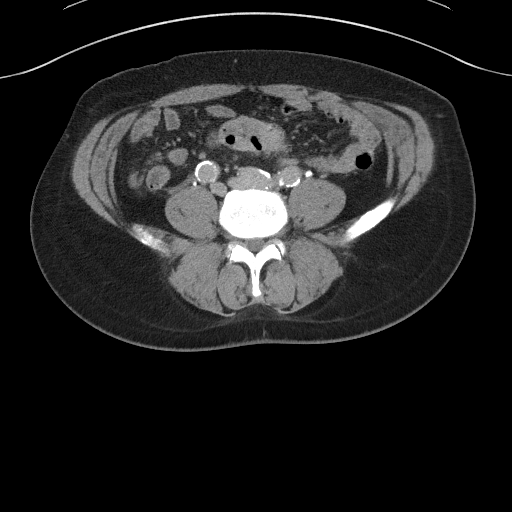
[im 45/104  lung]
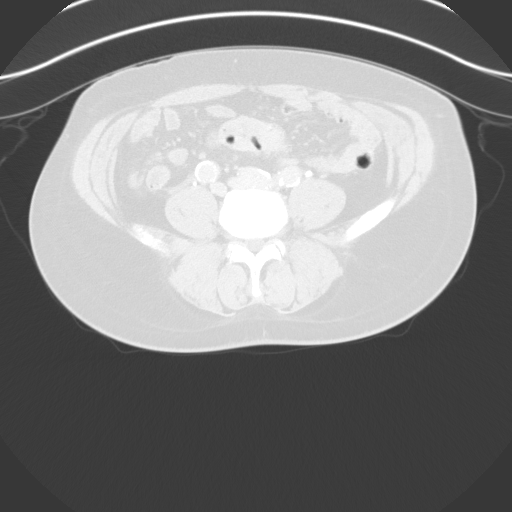
[im 59/104  soft-tissue]
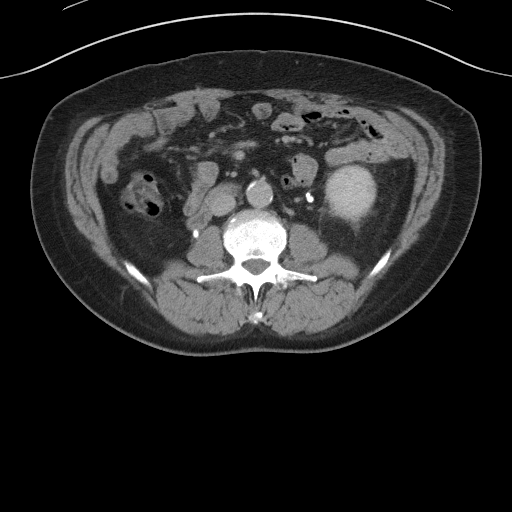
[im 59/104  lung]
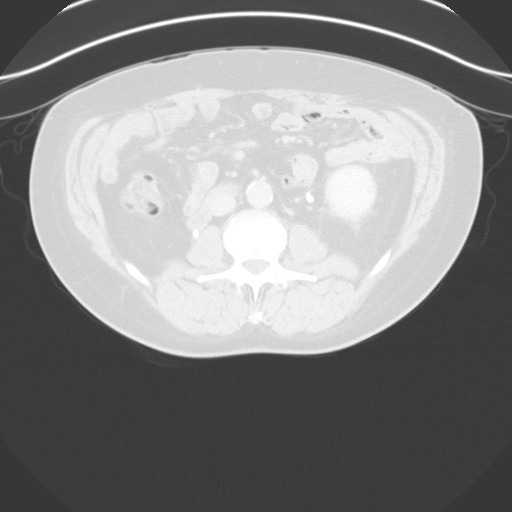
[im 74/104  soft-tissue]
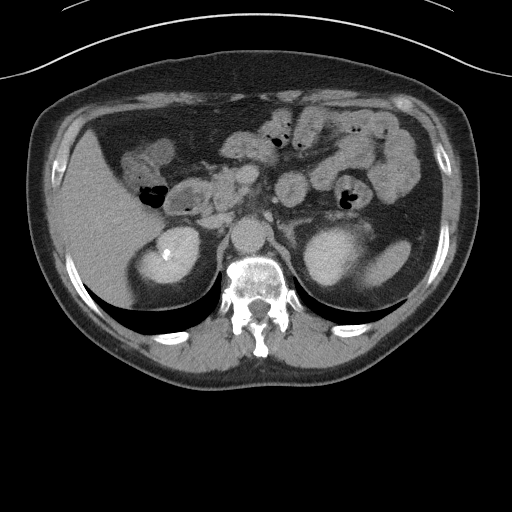
[im 74/104  lung]
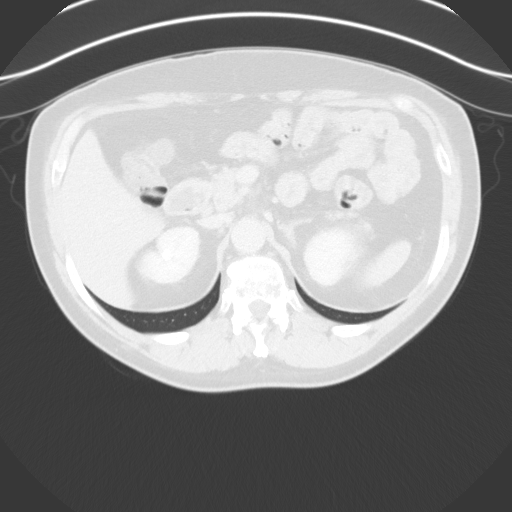
[im 89/104  soft-tissue]
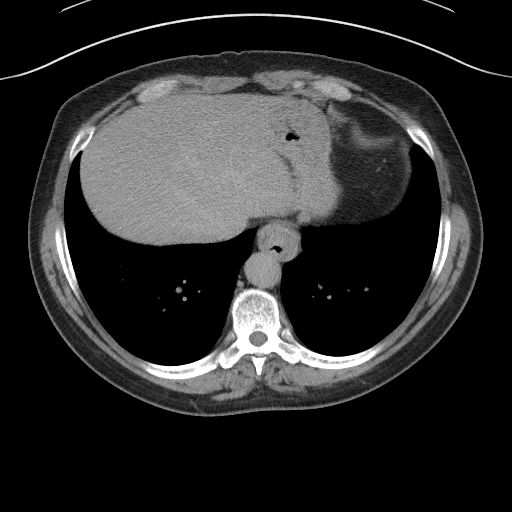
[im 89/104  lung]
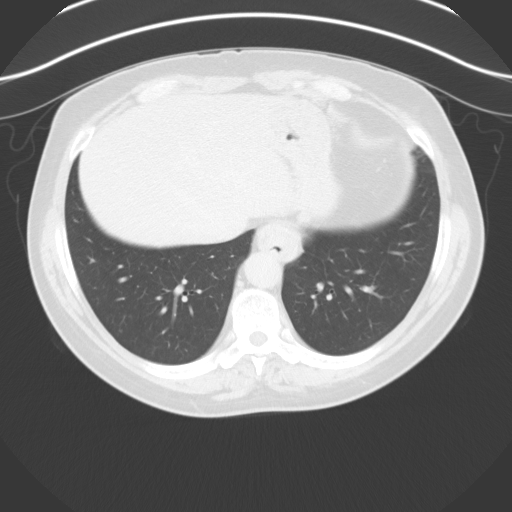

[11 of 46 positions shown; findings below may reference images not displayed]

FINDINGS: Lower chest: No acute abnormality.

Hepatobiliary: 1.6 cm cyst is identified along the posterior medial
dome of right hepatic lobe, image [DATE]. Within the anterior dome of
liver there is a 0.8 x 0.7 cm low-density structure which is
technically too small to reliably characterize but favored to
represent a small cyst.

Gallbladder normal.  No biliary ductal dilatation.

Pancreas: Unremarkable. No pancreatic ductal dilatation or
surrounding inflammatory changes.

Spleen: Normal in size without focal abnormality.

Adrenals/Urinary Tract: Normal appearance of the adrenal glands.
Nonenhancing hyperdense cyst arising from posterior cortex of the
left mid kidney measures 1.5 cm, image 35/4. Cyst within the
anterior cortex of the left mid kidney measures 1.5 cm. Additional
multiple subcentimeter intermediate and low attenuation foci are
noted in both kidneys. These are too small to reliably characterize.
Focal area of cortical calcification and scarring is noted within
the upper pole of left kidney measuring 9 mm. No kidney stones
identified bilaterally. Duplicated left renal collecting system. No
suspicious filling defects identified within the collecting systems
bilaterally. No hydroureter or ureteral lithiasis identified.
Urinary bladder is unremarkable.

Stomach/Bowel: Small hiatal hernia. Stomach is otherwise
unremarkable. Appendix appears normal. No evidence of bowel wall
thickening, distention, or inflammatory changes.

Vascular/Lymphatic: Aortic atherosclerosis. No aneurysm. No enlarged
abdominopelvic lymph nodes. Aneurysmal dilatation of the right
common iliac artery measures 1.7 cm.

Reproductive: Several fiducial markers are noted within the prostate
gland.

Other: No free fluid or fluid collections identified. Fat containing
left inguinal hernia.

Musculoskeletal: No acute or significant osseous findings.
IMPRESSION: 1. No findings identified to explain patient's hematuria. No kidney
stones identified bilaterally.
2. Left kidney Bosniak category 1 and 2 cysts. Additional, bilateral
kidney lesions are less than 1 cm and too small to reliably
characterize.
3. Aortic atherosclerosis.
4. Aneurysmal dilatation of the right common iliac artery measures
1.7 cm.
5. Fat containing left inguinal hernia.

Aortic Atherosclerosis (XGI4D-SEJ.J).

## 2021-07-31 ENCOUNTER — Telehealth: Payer: Self-pay | Admitting: Family Medicine

## 2021-07-31 NOTE — Telephone Encounter (Signed)
[facility] w/ [facility] states patient is having teeth extracted on 6-30 and inquiring if provider recommends patient stopping the blood thinners prior to procedure  Please advise and fu w/ dental office

## 2021-08-02 ENCOUNTER — Encounter: Payer: Federal, State, Local not specified - PPO | Admitting: Family Medicine

## 2021-08-09 ENCOUNTER — Ambulatory Visit (INDEPENDENT_AMBULATORY_CARE_PROVIDER_SITE_OTHER): Payer: Federal, State, Local not specified - PPO | Admitting: Nurse Practitioner

## 2021-08-09 ENCOUNTER — Other Ambulatory Visit (INDEPENDENT_AMBULATORY_CARE_PROVIDER_SITE_OTHER): Payer: Self-pay | Admitting: Nurse Practitioner

## 2021-08-09 ENCOUNTER — Encounter (INDEPENDENT_AMBULATORY_CARE_PROVIDER_SITE_OTHER): Payer: Self-pay | Admitting: Nurse Practitioner

## 2021-08-09 ENCOUNTER — Ambulatory Visit (INDEPENDENT_AMBULATORY_CARE_PROVIDER_SITE_OTHER): Payer: Federal, State, Local not specified - PPO

## 2021-08-09 VITALS — BP 110/70 | HR 56 | Resp 16 | Ht 72.0 in | Wt 186.0 lb

## 2021-08-09 DIAGNOSIS — Z9889 Other specified postprocedural states: Secondary | ICD-10-CM

## 2021-08-09 DIAGNOSIS — I10 Essential (primary) hypertension: Secondary | ICD-10-CM | POA: Diagnosis not present

## 2021-08-09 DIAGNOSIS — E785 Hyperlipidemia, unspecified: Secondary | ICD-10-CM

## 2021-08-09 DIAGNOSIS — I739 Peripheral vascular disease, unspecified: Secondary | ICD-10-CM | POA: Diagnosis not present

## 2021-08-10 ENCOUNTER — Encounter (INDEPENDENT_AMBULATORY_CARE_PROVIDER_SITE_OTHER): Payer: Self-pay | Admitting: Nurse Practitioner

## 2021-08-10 NOTE — Progress Notes (Signed)
Subjective:    Patient ID: Roberto Walker, male    DOB: 1961-01-07, 61 y.o.   MRN: [id number] No chief complaint on file.   The patient returns to the office for followup and review of the noninvasive studies.   There have been no interval changes in lower extremity symptoms. No interval shortening of the patient's claudication distance or development of rest pain symptoms. No new ulcers or wounds have occurred since the last visit.  There have been no significant changes to the patient's overall health care.  The patient denies amaurosis fugax or recent TIA symptoms. There are no documented recent neurological changes noted. There is no history of DVT, PE or superficial thrombophlebitis. The patient denies recent episodes of angina or shortness of breath.   ABI Rt=1.41 and Lt=1.14  (previous ABI's Rt=1.21 and Lt=1.25) Duplex ultrasound of the triphasic tibial artery waveforms with normal toe waveforms bilaterally    Review of Systems  Skin:  Negative for wound.  All other systems reviewed and are negative.      Objective:   Physical Exam Vitals reviewed.  HENT:     Head: Normocephalic.  Cardiovascular:     Rate and Rhythm: Normal rate and regular rhythm.     Pulses:          Dorsalis pedis pulses are detected w/ Doppler on the right side and detected w/ Doppler on the left side.       Posterior tibial pulses are detected w/ Doppler on the right side and detected w/ Doppler on the left side.  Pulmonary:     Effort: Pulmonary effort is normal.  Skin:    General: Skin is warm and dry.  Neurological:     Mental Status: He is alert and oriented to person, place, and time.  Psychiatric:        Mood and Affect: Mood normal.        Behavior: Behavior normal.        Thought Content: Thought content normal.        Judgment: Judgment normal.     BP 110/70 (BP Location: Right Arm)   Pulse (!) 56   Resp 16   Ht 6' (1.829 m)   Wt 186 lb (84.4 kg)   BMI 25.23 kg/m    Past Medical History:  Diagnosis Date   Hypercalcemia    Hyperlipidemia    Hypertension    Metabolic syndrome    Microscopic hematuria    Osteoarthrosis    Peripheral vascular disease (HCC)    Prostate cancer (HCC)    Renal cell carcinoma (HCC)    Vitamin D deficiency    Vitiligo     Social History   Socioeconomic History   Marital status: Married    Spouse name: Mateo Flow   Number of children: 2   Years of education: College    Highest education level: Not on file  Occupational History   Occupation: RDU  Tobacco Use   Smoking status: Former    Packs/day: 0.50    Years: 20.00    Total pack years: 10.00    Types: Cigarettes    Quit date: 01/12/2012    Years since quitting: 9.5   Smokeless tobacco: Never  Vaping Use   Vaping Use: Never used  Substance and Sexual Activity   Alcohol use: Yes    Alcohol/week: 2.0 standard drinks of alcohol    Types: 2 Shots of liquor per week    Comment: Minimal alcohol consumption  Drug use: No   Sexual activity: Yes    Partners: Female  Other Topics Concern   Not on file  Social History Narrative   Not on file   Social Determinants of Health   Financial Resource Strain: Low Risk  (07/31/2020)   Overall Financial Resource Strain (CARDIA)    Difficulty of Paying Living Expenses: Not hard at all  Food Insecurity: No Food Insecurity (07/31/2020)   Hunger Vital Sign    Worried About Running Out of Food in the Last Year: Never true    [place] in the Last Year: Never true  Transportation Needs: No Transportation Needs (07/31/2020)   PRAPARE - Hydrologist (Medical): No    Lack of Transportation (Non-Medical): No  Physical Activity: Sufficiently Active (07/31/2020)   Exercise Vital Sign    Days of Exercise per Week: 5 days    Minutes of Exercise per Session: 60 min  Stress: No Stress Concern Present (07/31/2020)   [place]     Feeling of Stress : Not at all  Social Connections: [place] (01/11/2021)   Social Connection and Isolation Panel [NHANES]    Frequency of Communication with Friends and Family: More than three times a week    Frequency of Social Gatherings with Friends and Family: More than three times a week    Attends Religious Services: 1 to 4 times per year    Active Member of [employer] or Organizations: Yes    Attends Archivist Meetings: More than 4 times per year    Marital Status: Married  Human resources officer Violence: Not At Risk (07/31/2020)   Humiliation, Afraid, Rape, and Kick questionnaire    Fear of Current or Ex-Partner: No    Emotionally Abused: No    Physically Abused: No    Sexually Abused: No    Past Surgical History:  Procedure Laterality Date   Beam Radiation     Prostate using Photons   COLONOSCOPY WITH PROPOFOL N/A 09/18/2020   Procedure: COLONOSCOPY WITH PROPOFOL;  Surgeon: Virgel Manifold, MD;  Location: [facility] ENDOSCOPY;  Service: Endoscopy;  Laterality: N/A;   ILIAC VEIN ANGIOPLASTY / STENTING     LEFT HEART CATH  01/11/2012   LOWER EXTREMITY ANGIOGRAPHY Left 10/18/2020   Procedure: LOWER EXTREMITY ANGIOGRAPHY;  Surgeon: Algernon Huxley, MD;  Location: [facility];  Service: Cardiovascular;  Laterality: Left;   NEPHRECTOMY Right 2010   Partial    Family History  Problem Relation Age of Onset   Hypertension Mother    Lung cancer Father    Hypertension Father    Hyperlipidemia Father    Diabetes Sister        oldest sister   Hypertension Sister    Hypertension Sister     Allergies  Allergen Reactions   Lisinopril Swelling    Swelling around mouth, lips       Latest Ref Rng & Units 07/18/2021    8:07 AM 07/31/2020   10:50 AM 07/28/2019    9:10 AM  CBC  WBC 3.8 - 10.8 Thousand/uL 6.8  9.1  6.8   Hemoglobin 13.2 - 17.1 g/dL 14.0  14.3  13.5   Hematocrit 38.5 - 50.0 % 40.2  41.1  39.5   Platelets 140 - 400 Thousand/uL 258  286  263        CMP     Component Value Date/Time   NA 139 07/18/2021  0807   NA 141 08/02/2015 0853   NA 139 05/17/2013 0447   K 3.9 07/18/2021 0807   K 4.0 05/17/2013 0447   CL 109 07/18/2021 0807   CL 109 (H) 05/17/2013 0447   CO2 21 07/18/2021 0807   CO2 23 05/17/2013 0447   GLUCOSE 95 07/18/2021 0807   GLUCOSE 150 (H) 05/17/2013 0447   BUN 11 07/18/2021 0807   BUN 15 08/02/2015 0853   BUN 19 (H) 05/17/2013 0447   CREATININE 0.97 07/18/2021 0807   CALCIUM 10.3 07/18/2021 0807   CALCIUM 9.1 05/17/2013 0447   PROT 7.3 07/18/2021 0807   PROT 7.2 08/02/2015 0853   PROT 7.5 05/02/2011 0943   ALBUMIN 3.9 06/06/2016 0804   ALBUMIN 4.2 08/02/2015 0853   ALBUMIN 3.9 05/02/2011 0943   AST 28 07/18/2021 0807   AST 31 05/02/2011 0943   ALT 25 07/18/2021 0807   ALT 37 05/02/2011 0943   ALKPHOS 52 06/06/2016 0804   ALKPHOS 54 05/02/2011 0943   BILITOT 0.4 07/18/2021 0807   BILITOT 0.3 08/02/2015 0853   BILITOT 0.5 05/02/2011 0943   GFRNONAA >60 10/18/2020 1044   GFRNONAA 78 07/31/2020 1050   GFRAA 90 07/31/2020 1050     No results found.     Assessment & Plan:   1. Peripheral arterial disease with history of revascularization (HCC)  Recommend:  The patient has evidence of atherosclerosis of the lower extremities with claudication.  The patient does not voice lifestyle limiting changes at this point in time.  Noninvasive studies do not suggest clinically significant change.  No invasive studies, angiography or surgery at this time The patient should continue walking and begin a more formal exercise program.  The patient should continue antiplatelet therapy and aggressive treatment of the lipid abnormalities  No changes in the patient's medications at this time  Continued surveillance is indicated as atherosclerosis is likely to progress with time.    The patient will continue follow up with noninvasive studies as ordered.   - VAS [place] ABI WITH/WO TBI  2. Benign essential  HTN Continue antihypertensive medications as already ordered, these medications have been reviewed and there are no changes at this time.   3. Dyslipidemia Continue statin as ordered and reviewed, no changes at this time    Current Outpatient Medications on File Prior to Visit  Medication Sig Dispense Refill   amLODipine-olmesartan (AZOR) 10-40 MG tablet Take 1 tablet by mouth daily. 90 tablet 1   aspirin 81 MG tablet Take 1 tablet by mouth daily.     atorvastatin (LIPITOR) 80 MG tablet Take 1 tablet (80 mg total) by mouth daily. 90 tablet 1   carvedilol (COREG) 12.5 MG tablet Take 1 tablet (12.5 mg total) by mouth 2 (two) times daily. 180 tablet 1   chlorhexidine (PERIDEX) 0.12 % solution SMARTSIG:By Mouth     cholecalciferol (VITAMIN D) 1000 UNITS tablet Take 1 tablet by mouth daily.     clopidogrel (PLAVIX) 75 MG tablet Take 1 tablet (75 mg total) by mouth daily. 90 tablet 1   nitroGLYCERIN (NITROSTAT) 0.4 MG SL tablet Place 1 tablet (0.4 mg total) under the tongue daily. (Patient taking differently: Place 0.4 mg under the tongue as needed.) 30 tablet 0   sildenafil (VIAGRA) 50 MG tablet Take by mouth as needed.     No current facility-administered medications on file prior to visit.    There are no Patient Instructions on file for this visit. No follow-ups on file.  Kris Hartmann, NP

## 2021-08-14 DIAGNOSIS — I10 Essential (primary) hypertension: Secondary | ICD-10-CM | POA: Diagnosis not present

## 2021-08-14 DIAGNOSIS — E8881 Metabolic syndrome: Secondary | ICD-10-CM | POA: Diagnosis not present

## 2021-08-14 DIAGNOSIS — Z8546 Personal history of malignant neoplasm of prostate: Secondary | ICD-10-CM | POA: Diagnosis not present

## 2021-08-14 DIAGNOSIS — E785 Hyperlipidemia, unspecified: Secondary | ICD-10-CM | POA: Diagnosis not present

## 2021-08-15 LAB — PSA: PSA: <0.04 ng/mL (ref <=4.00)

## 2021-10-09 ENCOUNTER — Other Ambulatory Visit: Payer: Self-pay | Admitting: Family Medicine

## 2021-10-09 DIAGNOSIS — I10 Essential (primary) hypertension: Secondary | ICD-10-CM

## 2021-10-18 ENCOUNTER — Encounter: Payer: Federal, State, Local not specified - PPO | Admitting: Family Medicine

## 2021-11-20 NOTE — Progress Notes (Signed)
Name: Roberto Walker   MRN: [id number]    DOB: [birth year]   Date:11/22/2021       Progress Note  Subjective  Chief Complaint  Annual Exam  HPI  Patient presents for annual CPE.  IPSS Questionnaire (AUA-7): Over the past month.   1)  How often have you had a sensation of not emptying your bladder completely after you finish urinating?  0 - Not at all  2)  How often have you had to urinate again less than two hours after you finished urinating? 0 - Not at all  3)  How often have you found you stopped and started again several times when you urinated?  0 - Not at all  4) How difficult have you found it to postpone urination?  0 - Not at all  5) How often have you had a weak urinary stream?  0 - Not at all  6) How often have you had to push or strain to begin urination?  0 - Not at all  7) How many times did you most typically get up to urinate from the time you went to bed until the time you got up in the morning?  1 - 1 time  Total score:  0-7 mildly symptomatic   8-19 moderately symptomatic   20-35 severely symptomatic     Diet: cooks at home, he is soft food diet due to teeth extraction and will have teeth implant by the end of the year  Exercise: continue regular exercise  Last Dental Exam: up to date, had teeth extracted  Last Eye Exam: every other year   Depression: phq 9 is negative    11/22/2021    8:57 AM 06/26/2021    3:28 PM 01/11/2021   10:06 AM 07/31/2020   10:01 AM 01/11/2020    2:50 PM  Depression screen PHQ 2/9  Decreased Interest 0 0 0 0 0  Down, Depressed, Hopeless 0 0 0 0 0  PHQ - 2 Score 0 0 0 0 0  Altered sleeping 0  0    Tired, decreased energy 0  0    Change in appetite 0  0    Feeling bad or failure about yourself  0  0    Trouble concentrating 0  0    Moving slowly or fidgety/restless 0  0    Suicidal thoughts 0  0    PHQ-9 Score 0  0    Difficult doing work/chores   Not difficult at all      Hypertension:  BP Readings from Last 3 Encounters:   11/22/21 112/68  08/09/21 110/70  07/04/21 136/90    Obesity: Wt Readings from Last 3 Encounters:  11/22/21 182 lb (82.6 kg)  08/09/21 186 lb (84.4 kg)  07/04/21 194 lb 12.8 oz (88.4 kg)   BMI Readings from Last 3 Encounters:  11/22/21 24.68 kg/m  08/09/21 25.23 kg/m  07/04/21 26.42 kg/m     Lipids:  Lab Results  Component Value Date   CHOL 127 07/18/2021   CHOL 126 07/31/2020   CHOL 143 07/28/2019   Lab Results  Component Value Date   HDL 67 07/18/2021   HDL 56 07/31/2020   HDL 59 07/28/2019   Lab Results  Component Value Date   LDLCALC 47 07/18/2021   LDLCALC 55 07/31/2020   LDLCALC 71 07/28/2019   Lab Results  Component Value Date   TRIG 58 07/18/2021   TRIG 74 07/31/2020   TRIG 57 07/28/2019  Lab Results  Component Value Date   CHOLHDL 1.9 07/18/2021   CHOLHDL 2.3 07/31/2020   CHOLHDL 2.4 07/28/2019   No results found for: "LDLDIRECT" Glucose:  Glucose  Date Value Ref Range Status  05/17/2013 150 (H) 65 - 99 mg/dL Final  05/16/2013 111 (H) 65 - 99 mg/dL Final  03/08/2013 127 (H) 65 - 99 mg/dL Final   Glucose, Bld  Date Value Ref Range Status  07/18/2021 95 65 - 99 mg/dL Final    Comment:    .            Fasting reference interval .   07/31/2020 97 65 - 99 mg/dL Final    Comment:    .            Fasting reference interval .   07/28/2019 103 (H) 65 - 99 mg/dL Final    Comment:    .            Fasting reference interval . For someone without known diabetes, a glucose value between 100 and 125 mg/dL is consistent with prediabetes and should be confirmed with a follow-up test. .     [place] Office Visit from 01/11/2021 in [facility]  AUDIT-C Score 2       Married STD testing and prevention (HIV/chl/gon/syphilis): 02/05/12 Sexual history: taking viagra 50 mg , gets it from the [place], no problems with medication  Hep C Screening: 07/20/12 Skin cancer: Discussed monitoring for atypical  lesions Colorectal cancer: 09/18/20, duet for repeat colonoscopy , however wants to wait until early next year - he will call them directly  Prostate cancer:   Lab Results  Component Value Date   PSA <0.04 08/14/2021   PSA 0.06 07/31/2020   PSA <0.1 07/28/2019     Lung cancer:  Low Dose CT Chest recommended if Age 63-80 years, 30 pack-year currently smoking OR have quit w/in 15years. 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  Discussed health care proxy and Living will, and the patient was able to identify a health care proxy as wife.  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) 07/31/2020   Personal history of kidney cancer 02/06/2015   Benign essential HTN 08/02/2014   Coronary artery disease involving native coronary artery of native heart without angina pectoris 08/02/2014   Dyslipidemia 08/02/2014   H/O acute myocardial infarction 08/02/2014   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   Impotence of organic origin 78/93/8101   Dysmetabolic syndrome 75/11/2583   Arthritis, degenerative 08/02/2014   Peripheral vascular disease ([place]) 08/02/2014   Blood glucose elevated 08/02/2014   OSA (obstructive sleep apnea) 08/02/2014   Vitamin D deficiency 08/02/2014   Vitiligo 08/02/2014    Past Surgical History:  Procedure Laterality Date   Beam Radiation      Prostate using Photons   COLONOSCOPY WITH  PROPOFOL N/A 09/18/2020   Procedure: COLONOSCOPY WITH PROPOFOL;  Surgeon: Virgel Manifold, MD;  Location: [facility] ENDOSCOPY;  Service: Endoscopy;  Laterality: N/A;   ILIAC VEIN ANGIOPLASTY / STENTING     LEFT HEART CATH  01/11/2012   LOWER EXTREMITY ANGIOGRAPHY Left 10/18/2020   Procedure: LOWER EXTREMITY ANGIOGRAPHY;  Surgeon: Algernon Huxley, MD;  Location: [facility];  Service: Cardiovascular;  Laterality: Left;   NEPHRECTOMY Right 2010   Partial    Family History  Problem Relation Age of Onset   Hypertension Mother    Lung cancer Father    Hypertension Father    Hyperlipidemia Father    Diabetes Sister        oldest sister   Hypertension Sister    Hypertension Sister     Social History   Socioeconomic History   Marital status: Married    Spouse name: Mateo Flow   Number of children: 2   Years of education: College    Highest education level: Not on file  Occupational History   Occupation: RDU  Tobacco Use   Smoking status: Former    Packs/day: 0.50    Years: 20.00    Total pack years: 10.00    Types: Cigarettes    Quit date: 01/12/2012    Years since quitting: 9.8   Smokeless tobacco: Never  Vaping Use   Vaping Use: Never used  Substance and Sexual Activity   Alcohol use: Yes    Alcohol/week: 2.0 standard drinks of alcohol    Types: 2 Shots of liquor per week    Comment: Minimal alcohol consumption   Drug use: No   Sexual activity: Yes    Partners: Female  Other Topics Concern   Not on file  Social History Narrative   Not on file   Social Determinants of Health   Financial Resource Strain: Low Risk  (11/22/2021)   Overall Financial Resource Strain (CARDIA)    Difficulty of Paying Living Expenses: Not hard at all  Food Insecurity: No Food Insecurity (11/22/2021)   Hunger Vital Sign    Worried About Running Out of Food in the Last Year: Never true    Ran Out of Food in the Last Year: Never true   Transportation Needs: No Transportation Needs (11/22/2021)   PRAPARE - Hydrologist (Medical): No    Lack of Transportation (Non-Medical): No  Physical Activity: Sufficiently Active (11/22/2021)   Exercise Vital Sign    Days of Exercise per Week: 5 days    Minutes of Exercise per Session: 60 min  Stress: No Stress Concern Present (11/22/2021)   [place]    Feeling of Stress : Only a little  Social Connections: Moderately Integrated (11/22/2021)   Social Connection and Isolation Panel [NHANES]    Frequency of Communication with Friends and Family: More than three times a week    Frequency of Social Gatherings with Friends and Family: More than three times a week    Attends Religious Services: 1 to 4 times per year    Active Member of [employer] or Organizations: No    Attends Archivist Meetings: Never    Marital Status: Married  Human resources officer Violence: Not At Risk (11/22/2021)   Humiliation, Afraid, Rape, and Kick questionnaire    Fear of Current or Ex-Partner: No    Emotionally Abused: No    Physically Abused: No    Sexually Abused: No  Current Outpatient Medications:    amLODipine-olmesartan (AZOR) 10-40 MG tablet, TAKE 1 TABLET BY MOUTH DAILY, Disp: 90 tablet, Rfl: 0   aspirin 81 MG tablet, Take 1 tablet by mouth daily., Disp: , Rfl:    atorvastatin (LIPITOR) 80 MG tablet, Take 1 tablet (80 mg total) by mouth daily., Disp: 90 tablet, Rfl: 1   carvedilol (COREG) 12.5 MG tablet, Take 1 tablet (12.5 mg total) by mouth 2 (two) times daily., Disp: 180 tablet, Rfl: 1   cholecalciferol (VITAMIN D) 1000 UNITS tablet, Take 1 tablet by mouth daily., Disp: , Rfl:    clopidogrel (PLAVIX) 75 MG tablet, Take 1 tablet (75 mg total) by mouth daily., Disp: 90 tablet, Rfl: 1   nitroGLYCERIN (NITROSTAT) 0.4 MG SL tablet, Place 1 tablet (0.4 mg total) under the tongue daily. (Patient taking  differently: Place 0.4 mg under the tongue as needed.), Disp: 30 tablet, Rfl: 0   sildenafil (VIAGRA) 50 MG tablet, Take by mouth as needed., Disp: , Rfl:   Allergies  Allergen Reactions   Lisinopril Swelling    Swelling around mouth, lips     ROS  Constitutional: Negative for fever or weight change.  Respiratory: Negative for cough and shortness of breath.   Cardiovascular: Negative for chest pain or palpitations.  Gastrointestinal: Negative for abdominal pain, no bowel changes.  Musculoskeletal: Negative for gait problem or joint swelling.  Skin: Negative for rash.  Neurological: Negative for dizziness or headache.  No other specific complaints in a complete review of systems (except as listed in HPI above).    Objective  Vitals:   11/22/21 0857  BP: 112/68  Pulse: 63  Resp: 16  SpO2: 98%  Weight: 182 lb (82.6 kg)  Height: 6' (1.829 m)    Body mass index is 24.68 kg/m.  Physical Exam  Constitutional: Patient appears well-developed and well-nourished. No distress.  HENT: Head: Normocephalic and atraumatic. Ears: B TMs ok, no erythema or effusion; Nose: Nose normal. Mouth/Throat: Oropharynx is clear and moist. No oropharyngeal exudate.  Eyes: Conjunctivae and EOM are normal. Pupils are equal, round, and reactive to light. No scleral icterus.  Neck: Normal range of motion. Neck supple. No JVD present. No thyromegaly present.  Cardiovascular: Normal rate, regular rhythm and normal heart sounds.  No murmur heard. No BLE edema. Pulmonary/Chest: Effort normal and breath sounds normal. No respiratory distress. Abdominal: Soft. Bowel sounds are normal, no distension. There is no tenderness. no masses MALE GENITALIA: Normal descended testes bilaterally, large reducible left inguinal hernia , no lesions, no discharge RECTAL: Prostate normal size and consistency, no rectal masses or hemorrhoids Musculoskeletal: Normal range of motion, no joint effusions. No gross  deformities Neurological: he is alert and oriented to person, place, and time. No cranial nerve deficit. Coordination, balance, strength, speech and gait are normal.  Skin: Skin is warm and dry. No rash noted. No erythema. He has vitiligo  Psychiatric: Patient has a normal mood and affect. behavior is normal. Judgment and thought content normal.   Fall Risk:    11/22/2021    8:56 AM 06/26/2021    3:28 PM 01/11/2021   10:06 AM 07/31/2020   10:01 AM 01/11/2020    2:50 PM  Fall Risk   Falls in the past year? 0 0  0 0  Number falls in past yr: 0 0 0 0 0  Injury with Fall? 0 0 0 0 0  Risk for fall due to : No Fall Risks No Fall Risks No Fall Risks  Follow up Falls prevention discussed Falls prevention discussed Falls prevention discussed       Functional Status Survey: Is the patient deaf or have difficulty hearing?: No Does the patient have difficulty seeing, even when wearing glasses/contacts?: No Does the patient have difficulty concentrating, remembering, or making decisions?: No Does the patient have difficulty walking or climbing stairs?: No Does the patient have difficulty dressing or bathing?: No Does the patient have difficulty doing errands alone such as visiting a doctor's office or shopping?: No    Assessment & Plan  1. Well adult exam   2. Need for immunization against influenza  - Flu Vaccine QUAD 6+ mos PF IM (Fluarix Quad PF)  3. Colon cancer screening  - Ambulatory referral to Gastroenterology  4. Need for Tdap vaccination  - Tdap vaccine greater than or equal to 7yo IM  5. Left inguinal hernia  It is starting to cause discomfort and will contact me when ready for surgery, possibly early next year     -Prostate cancer screening and PSA options (with potential risks and benefits of testing vs not testing) were discussed along with recent recs/guidelines. -USPSTF grade A and B recommendations reviewed with patient; age-appropriate recommendations,  preventive care, screening tests, etc discussed and encouraged; healthy living encouraged; see AVS for patient education given to patient -Discussed importance of 150 minutes of physical activity weekly, eat two servings of fish weekly, eat one serving of tree nuts ( cashews, pistachios, pecans, almonds.Marland Kitchen) every other day, eat 6 servings of fruit/vegetables daily and drink plenty of water and avoid sweet beverages.  -Reviewed Health Maintenance: yes

## 2021-11-20 NOTE — Patient Instructions (Addendum)
RSV and shingrix vaccines   Preventive Care 82-62 Years Old, Male Preventive care refers to lifestyle choices and visits with your health care provider that can promote health and wellness. Preventive care visits are also called wellness exams. What can I expect for my preventive care visit? Counseling During your preventive care visit, your health care provider may ask about your: Medical history, including: Past medical problems. Family medical history. Current health, including: Emotional well-being. Home life and relationship well-being. Sexual activity. Lifestyle, including: Alcohol, nicotine or tobacco, and drug use. Access to firearms. Diet, exercise, and sleep habits. Safety issues such as seatbelt and bike helmet use. Sunscreen use. Work and work Statistician. Physical exam Your health care provider will check your: Height and weight. These may be used to calculate your BMI (body mass index). BMI is a measurement that tells if you are at a healthy weight. Waist circumference. This measures the distance around your waistline. This measurement also tells if you are at a healthy weight and may help predict your risk of certain diseases, such as type 2 diabetes and high blood pressure. Heart rate and blood pressure. Body temperature. Skin for abnormal spots. What immunizations do I need?  Vaccines are usually given at various ages, according to a schedule. Your health care provider will recommend vaccines for you based on your age, medical history, and lifestyle or other factors, such as travel or where you work. What tests do I need? Screening Your health care provider may recommend screening tests for certain conditions. This may include: Lipid and cholesterol levels. Diabetes screening. This is done by checking your blood sugar (glucose) after you have not eaten for a while (fasting). Hepatitis B test. Hepatitis C test. HIV (human immunodeficiency virus) test. STI  (sexually transmitted infection) testing, if you are at risk. Lung cancer screening. Prostate cancer screening. Colorectal cancer screening. Talk with your health care provider about your test results, treatment options, and if necessary, the need for more tests. Follow these instructions at home: Eating and drinking  Eat a diet that includes fresh fruits and vegetables, whole grains, lean protein, and low-fat dairy products. Take vitamin and mineral supplements as recommended by your health care provider. Do not drink alcohol if your health care provider tells you not to drink. If you drink alcohol: Limit how much you have to 0-2 drinks a day. Know how much alcohol is in your drink. In the [place] one drink equals one 12 oz bottle of beer (355 mL), one 5 oz glass of wine (148 mL), or one 1 oz glass of hard liquor (44 mL). Lifestyle Brush your teeth every morning and night with fluoride toothpaste. Floss one time each day. Exercise for at least 30 minutes 5 or more days each week. Do not use any products that contain nicotine or tobacco. These products include cigarettes, chewing tobacco, and vaping devices, such as e-cigarettes. If you need help quitting, ask your health care provider. Do not use drugs. If you are sexually active, practice safe sex. Use a condom or other form of protection to prevent STIs. Take aspirin only as told by your health care provider. Make sure that you understand how much to take and what form to take. Work with your health care provider to find out whether it is safe and beneficial for you to take aspirin daily. Find healthy ways to manage stress, such as: Meditation, yoga, or listening to music. Journaling. Talking to a trusted person. Spending time with friends and family. Minimize  exposure to UV radiation to reduce your risk of skin cancer. Safety Always wear your seat belt while driving or riding in a vehicle. Do not drive: If you have been drinking  alcohol. Do not ride with someone who has been drinking. When you are tired or distracted. While texting. If you have been using any mind-altering substances or drugs. Wear a helmet and other protective equipment during sports activities. If you have firearms in your house, make sure you follow all gun safety procedures. What's next? Go to your health care provider once a year for an annual wellness visit. Ask your health care provider how often you should have your eyes and teeth checked. Stay up to date on all vaccines. This information is not intended to replace advice given to you by your health care provider. Make sure you discuss any questions you have with your health care provider. Document Revised: 07/18/2020 Document Reviewed: 07/18/2020 Elsevier Patient Education  [employer].

## 2021-11-22 ENCOUNTER — Ambulatory Visit (INDEPENDENT_AMBULATORY_CARE_PROVIDER_SITE_OTHER): Payer: Federal, State, Local not specified - PPO | Admitting: Family Medicine

## 2021-11-22 ENCOUNTER — Encounter: Payer: Self-pay | Admitting: Family Medicine

## 2021-11-22 VITALS — BP 112/68 | HR 63 | Resp 16 | Ht 72.0 in | Wt 182.0 lb

## 2021-11-22 DIAGNOSIS — Z0000 Encounter for general adult medical examination without abnormal findings: Secondary | ICD-10-CM

## 2021-11-22 DIAGNOSIS — Z1211 Encounter for screening for malignant neoplasm of colon: Secondary | ICD-10-CM | POA: Diagnosis not present

## 2021-11-22 DIAGNOSIS — K4090 Unilateral inguinal hernia, without obstruction or gangrene, not specified as recurrent: Secondary | ICD-10-CM

## 2021-11-22 DIAGNOSIS — Z23 Encounter for immunization: Secondary | ICD-10-CM

## 2021-12-02 ENCOUNTER — Encounter (INDEPENDENT_AMBULATORY_CARE_PROVIDER_SITE_OTHER): Payer: Self-pay

## 2021-12-09 DIAGNOSIS — M170 Bilateral primary osteoarthritis of knee: Secondary | ICD-10-CM | POA: Diagnosis not present

## 2022-01-22 DIAGNOSIS — H6123 Impacted cerumen, bilateral: Secondary | ICD-10-CM | POA: Diagnosis not present

## 2022-01-22 DIAGNOSIS — H938X2 Other specified disorders of left ear: Secondary | ICD-10-CM | POA: Diagnosis not present

## 2022-01-22 DIAGNOSIS — H6982 Other specified disorders of Eustachian tube, left ear: Secondary | ICD-10-CM | POA: Diagnosis not present

## 2022-02-19 ENCOUNTER — Encounter: Payer: Self-pay | Admitting: Family Medicine

## 2022-02-24 NOTE — Progress Notes (Signed)
Name: Roberto Walker   MRN: [id number]    DOB: May 07, 1960   Date:02/25/2022       Progress Note  Subjective  Chief Complaint  Hernia   HPI  Left inguinal hernia: he noticed that the area is getting larger/bulging more, noticing discomfort when he coughs or when bending over, he can still manually reduce it but would like to see a surgeon and have it taken care of now .   No fever, no chills or change in bowel movements  He had an MI Dec 2013 but is on medical management and denies chest pain, palpitation or decrease in exercise tolerance. He has dentures. 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[place]) 06/26/2021   Atherosclerosis of native arteries of extremity with intermittent claudication ([place]) 02/08/2021   History of colonic polyps    Polyp of colon    Atherosclerosis of aorta ([place]) 07/31/2020   Personal history of kidney cancer 02/06/2015   Benign essential HTN 08/02/2014   Coronary artery disease involving native coronary artery of native heart without angina pectoris 08/02/2014   Dyslipidemia 08/02/2014   H/O acute myocardial infarction 08/02/2014   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   Impotence of organic origin 32/35/5732   Dysmetabolic syndrome 20/25/4270   Arthritis, degenerative 08/02/2014   Peripheral vascular disease ([place]) 08/02/2014   Blood glucose elevated 08/02/2014   OSA (obstructive sleep apnea) 08/02/2014   Vitamin D deficiency 08/02/2014   Vitiligo 08/02/2014    Past Surgical History:  Procedure Laterality Date   Beam Radiation     Prostate using Photons   COLONOSCOPY WITH PROPOFOL N/A 09/18/2020   Procedure: COLONOSCOPY WITH PROPOFOL;  Surgeon: Virgel Manifold, MD;  Location: [facility]  ENDOSCOPY;  Service: Endoscopy;  Laterality: N/A;   ILIAC VEIN ANGIOPLASTY / STENTING     LEFT HEART CATH  01/11/2012   LOWER EXTREMITY ANGIOGRAPHY Left 10/18/2020   Procedure: LOWER EXTREMITY ANGIOGRAPHY;  Surgeon: Algernon Huxley, MD;  Location: [facility];  Service: Cardiovascular;  Laterality: Left;   NEPHRECTOMY Right 2010   Partial    Family History  Problem Relation Age of Onset   Hypertension Mother    Lung cancer Father    Hypertension Father    Hyperlipidemia Father    Diabetes Sister        oldest sister   Hypertension Sister    Hypertension Sister     Social History   Tobacco Use   Smoking status: Former    Packs/day: 0.50    Years: 20.00    Total pack years: 10.00    Types: Cigarettes    Start date: 63    Quit date: 01/12/2012    Years since quitting: 10.1   Smokeless tobacco: Never  Substance Use Topics   Alcohol use: Yes    Alcohol/week: 2.0 standard drinks of alcohol    Types: 2 Shots of liquor per week    Comment: Minimal alcohol consumption     Current Outpatient Medications:    amLODipine-olmesartan (AZOR) 10-40 MG tablet, TAKE 1 TABLET BY MOUTH DAILY, Disp: 90 tablet, Rfl: 0   aspirin 81 MG tablet, Take 1 tablet by mouth daily., Disp: , Rfl:  atorvastatin (LIPITOR) 80 MG tablet, Take 1 tablet (80 mg total) by mouth daily., Disp: 90 tablet, Rfl: 1   carvedilol (COREG) 12.5 MG tablet, Take 1 tablet (12.5 mg total) by mouth 2 (two) times daily., Disp: 180 tablet, Rfl: 1   cholecalciferol (VITAMIN D) 1000 UNITS tablet, Take 1 tablet by mouth daily., Disp: , Rfl:    clopidogrel (PLAVIX) 75 MG tablet, Take 1 tablet (75 mg total) by mouth daily., Disp: 90 tablet, Rfl: 1   nitroGLYCERIN (NITROSTAT) 0.4 MG SL tablet, Place 1 tablet (0.4 mg total) under the tongue daily. (Patient taking differently: Place 0.4 mg under the tongue as needed.), Disp: 30 tablet, Rfl: 0   sildenafil (VIAGRA) 50 MG tablet, Take by mouth as needed., Disp: , Rfl:    Allergies  Allergen Reactions   Lisinopril Swelling    Swelling around mouth, lips    I personally reviewed active problem list, medication list, allergies, family history, social history, health maintenance with the patient/caregiver today.   ROS  Ten systems reviewed and is negative except as mentioned in HPI    Objective  Vitals:   02/25/22 0943  BP: 116/70  Pulse: (!) 53  Resp: 16  Temp: 98.3 F (36.8 C)  TempSrc: Oral  SpO2: 99%  Weight: 185 lb 1.6 oz (84 kg)  Height: 6' (1.829 m)    Body mass index is 25.1 kg/m.  Physical Exam  Constitutional: Patient appears well-developed and well-nourished.  No distress.  HEENT: head atraumatic, normocephalic, pupils equal and reactive to light, neck supple Cardiovascular: Normal rate, regular rhythm and normal heart sounds.  No murmur heard. No BLE edema. Pulmonary/Chest: Effort normal and breath sounds normal. No respiratory distress. Abdominal: Soft.  There is no tenderness. Large bulging on left lower quadrant, no redness of increase in warmth, reducible  Psychiatric: Patient has a normal mood and affect. behavior is normal. Judgment and thought content normal.  PHQ2/9:    02/25/2022    9:45 AM 11/22/2021    8:57 AM 06/26/2021    3:28 PM 01/11/2021   10:06 AM 07/31/2020   10:01 AM  Depression screen PHQ 2/9  Decreased Interest 0 0 0 0 0  Down, Depressed, Hopeless 0 0 0 0 0  PHQ - 2 Score 0 0 0 0 0  Altered sleeping 0 0  0   Tired, decreased energy 0 0  0   Change in appetite 0 0  0   Feeling bad or failure about yourself  0 0  0   Trouble concentrating 0 0  0   Moving slowly or fidgety/restless 0 0  0   Suicidal thoughts 0 0  0   PHQ-9 Score 0 0  0   Difficult doing work/chores    Not difficult at all     phq 9 is negative   Fall Risk:    02/25/2022    9:45 AM 11/22/2021    8:56 AM 06/26/2021    3:28 PM 01/11/2021   10:06 AM 07/31/2020   10:01 AM  Fall Risk   Falls in the past year? 0 0 0  0  Number  falls in past yr:  0 0 0 0  Injury with Fall?  0 0 0 0  Risk for fall due to : No Fall Risks No Fall Risks No Fall Risks No Fall Risks   Follow up Falls prevention discussed Falls prevention discussed Falls prevention discussed Falls prevention discussed     Functional Status Survey: Is  the patient deaf or have difficulty hearing?: No Does the patient have difficulty seeing, even when wearing glasses/contacts?: No Does the patient have difficulty concentrating, remembering, or making decisions?: No Does the patient have difficulty walking or climbing stairs?: No Does the patient have difficulty dressing or bathing?: No Does the patient have difficulty doing errands alone such as visiting a doctor's office or shopping?: No    Assessment & Plan  1. Left inguinal hernia  - Ambulatory referral to General Surgery

## 2022-02-25 ENCOUNTER — Ambulatory Visit (INDEPENDENT_AMBULATORY_CARE_PROVIDER_SITE_OTHER): Payer: Federal, State, Local not specified - PPO | Admitting: Family Medicine

## 2022-02-25 ENCOUNTER — Encounter: Payer: Self-pay | Admitting: Family Medicine

## 2022-02-25 VITALS — BP 116/70 | HR 53 | Temp 98.3°F | Resp 16 | Ht 72.0 in | Wt 185.1 lb

## 2022-02-25 DIAGNOSIS — K4090 Unilateral inguinal hernia, without obstruction or gangrene, not specified as recurrent: Secondary | ICD-10-CM | POA: Diagnosis not present

## 2022-02-26 NOTE — H&P (View-Only) (Signed)
Patient ID: Roberto Walker, male   DOB: 01-10-1961, 62 y.o.   MRN: [id number]  Chief Complaint: Left inguinal hernia  History of Present Illness [name] is a 62 y.o. male with left inguinal hernia/left groin bulge present for the last 6 to 8 months.  Reports it first presented likely when lifting a little more than he should have.  Denies any back strain associated with that lifting event.  Reports his symptoms are relatively painless, just a little discomforting when the bulge protrudes, typically associated with physical activity.  Denies any pain with coughing or sneezing.  Denies any pain with bowel activity.  No history of nausea, vomiting, fevers or chills.    Past Medical History Past Medical History:  Diagnosis Date   Hypercalcemia    Hyperlipidemia    Hypertension    Metabolic syndrome    Microscopic hematuria    Osteoarthrosis    Peripheral vascular disease ([place])    Prostate cancer ([place])    Renal cell carcinoma ([place])    Vitamin D deficiency    Vitiligo       Past Surgical History:  Procedure Laterality Date   Beam Radiation     Prostate using Photons   COLONOSCOPY WITH PROPOFOL N/A 09/18/2020   Procedure: COLONOSCOPY WITH PROPOFOL;  Surgeon: Virgel Manifold, MD;  Location: [facility] ENDOSCOPY;  Service: Endoscopy;  Laterality: N/A;   ILIAC VEIN ANGIOPLASTY / STENTING     LEFT HEART CATH  01/11/2012   LOWER EXTREMITY ANGIOGRAPHY Left 10/18/2020   Procedure: LOWER EXTREMITY ANGIOGRAPHY;  Surgeon: Algernon Huxley, MD;  Location: [facility];  Service: Cardiovascular;  Laterality: Left;   NEPHRECTOMY Right 2010   Partial    Allergies  Allergen Reactions   Lisinopril Swelling    Swelling around mouth, lips    Current Outpatient Medications  Medication Sig Dispense Refill   amLODipine-olmesartan (AZOR) 10-40 MG tablet TAKE 1 TABLET BY MOUTH DAILY 90 tablet 0   aspirin 81 MG tablet Take 1 tablet by mouth daily.     atorvastatin (LIPITOR) 80 MG tablet Take 1  tablet (80 mg total) by mouth daily. 90 tablet 1   carvedilol (COREG) 12.5 MG tablet Take 1 tablet (12.5 mg total) by mouth 2 (two) times daily. 180 tablet 1   cholecalciferol (VITAMIN D) 1000 UNITS tablet Take 1 tablet by mouth daily.     clopidogrel (PLAVIX) 75 MG tablet Take 1 tablet (75 mg total) by mouth daily. 90 tablet 1   nitroGLYCERIN (NITROSTAT) 0.4 MG SL tablet Place 1 tablet (0.4 mg total) under the tongue daily. (Patient taking differently: Place 0.4 mg under the tongue as needed.) 30 tablet 0   sildenafil (VIAGRA) 50 MG tablet Take by mouth as needed.     No current facility-administered medications for this visit.    Family History Family History  Problem Relation Age of Onset   Hypertension Mother    Lung cancer Father    Hypertension Father    Hyperlipidemia Father    Diabetes Sister        oldest sister   Hypertension Sister    Hypertension Sister       Social History Social History   Tobacco Use   Smoking status: Former    Packs/day: 0.50    Years: 20.00    Total pack years: 10.00    Types: Cigarettes    Start date: 68    Quit date: 01/12/2012    Years since quitting: 10.1  Smokeless tobacco: Never  Vaping Use   Vaping Use: Never used  Substance Use Topics   Alcohol use: Yes    Alcohol/week: 2.0 standard drinks of alcohol    Types: 2 Shots of liquor per week    Comment: Minimal alcohol consumption   Drug use: No        Review of Systems  All other systems reviewed and are negative.    Physical Exam Blood pressure 124/74, pulse 60, temperature 98.8 F (37.1 C), temperature source Oral, height 6' (1.829 m), weight 182 lb 3.2 oz (82.6 kg), SpO2 96 %. Last Weight  Most recent update: 02/27/2022 11:13 AM    Weight  82.6 kg (182 lb 3.2 oz)             CONSTITUTIONAL: Well developed, and nourished, appropriately responsive and aware without distress.   EYES: Sclera non-icteric.   EARS, NOSE, MOUTH AND THROAT:  The oropharynx is clear.  Oral mucosa is pink and moist.    Hearing is intact to voice.  NECK: Trachea is midline, and there is no jugular venous distension.  LYMPH NODES:  Lymph nodes in the neck are not appreciated. RESPIRATORY:   Normal respiratory effort without pathologic use of accessory muscles. CARDIOVASCULAR:  Well perfused.  GI: The abdomen is soft, nontender, and nondistended. There were no other palpable masses.  GU: Readily reducible prominent left groin bulge with obvious asymmetry compared to the right. MUSCULOSKELETAL:  Symmetrical muscle tone appreciated in all four extremities.    SKIN: Skin turgor is normal. No pathologic skin lesions appreciated.  NEUROLOGIC:  Motor and sensation appear grossly normal.  Cranial nerves are grossly without defect. PSYCH:  Alert and oriented to person, place and time. Affect is appropriate for situation.  Data Reviewed I have personally reviewed what is currently available of the patient's imaging, recent labs and medical records.   Labs:     Latest Ref Rng & Units 07/18/2021    8:07 AM 07/31/2020   10:50 AM 07/28/2019    9:10 AM  CBC  WBC 3.8 - 10.8 Thousand/uL 6.8  9.1  6.8   Hemoglobin 13.2 - 17.1 g/dL 14.0  14.3  13.5   Hematocrit 38.5 - 50.0 % 40.2  41.1  39.5   Platelets 140 - 400 Thousand/uL 258  286  263       Latest Ref Rng & Units 07/18/2021    8:07 AM 10/18/2020   10:44 AM 07/31/2020   10:50 AM  CMP  Glucose 65 - 99 mg/dL 95   97   BUN 7 - 25 mg/dL 11  17  16   $ Creatinine 0.70 - 1.35 mg/dL 0.97  0.92  1.04   Sodium 135 - 146 mmol/L 139   138   Potassium 3.5 - 5.3 mmol/L 3.9   4.1   Chloride 98 - 110 mmol/L 109   110   CO2 20 - 32 mmol/L 21   20   Calcium 8.6 - 10.3 mg/dL 10.3   10.1   Total Protein 6.1 - 8.1 g/dL 7.3   7.3   Total Bilirubin 0.2 - 1.2 mg/dL 0.4   0.5   AST 10 - 35 U/L 28   25   ALT 9 - 46 U/L 25   32     Imaging: Radiological images reviewed:  CLINICAL DATA:  Hematuria.   EXAM: CT ABDOMEN AND PELVIS WITHOUT AND WITH  CONTRAST   TECHNIQUE: Multidetector CT imaging of the abdomen and pelvis was  performed following the standard protocol before and following the bolus administration of intravenous contrast.   CONTRAST:  162m OMNIPAQUE IOHEXOL 300 MG/ML  SOLN   COMPARISON:  08/09/2008   FINDINGS: Lower chest: No acute abnormality.   Hepatobiliary: 1.6 cm cyst is identified along the posterior medial dome of right hepatic lobe, image 19/4. Within the anterior dome of liver there is a 0.8 x 0.7 cm low-density structure which is technically too small to reliably characterize but favored to represent a small cyst.   Gallbladder normal.  No biliary ductal dilatation.   Pancreas: Unremarkable. No pancreatic ductal dilatation or surrounding inflammatory changes.   Spleen: Normal in size without focal abnormality.   Adrenals/Urinary Tract: Normal appearance of the adrenal glands. Nonenhancing hyperdense cyst arising from posterior cortex of the left mid kidney measures 1.5 cm, image 35/4. Cyst within the anterior cortex of the left mid kidney measures 1.5 cm. Additional multiple subcentimeter intermediate and low attenuation foci are noted in both kidneys. These are too small to reliably characterize. Focal area of cortical calcification and scarring is noted within the upper pole of left kidney measuring 9 mm. No kidney stones identified bilaterally. Duplicated left renal collecting system. No suspicious filling defects identified within the collecting systems bilaterally. No hydroureter or ureteral lithiasis identified. Urinary bladder is unremarkable.   Stomach/Bowel: Small hiatal hernia. Stomach is otherwise unremarkable. Appendix appears normal. No evidence of bowel wall thickening, distention, or inflammatory changes.   Vascular/Lymphatic: Aortic atherosclerosis. No aneurysm. No enlarged abdominopelvic lymph nodes. 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    Reproductive: Several fiducial markers are noted within the prostate gland.   Other: No free fluid or fluid collections identified. Fat containing left inguinal hernia.   Musculoskeletal: No acute or significant osseous findings.   IMPRESSION: 1. No findings identified to explain patient's hematuria. No kidney stones identified bilaterally. 2. Left kidney Bosniak category 1 and 2 cysts. Additional, bilateral kidney lesions are less than 1 cm and too small to reliably characterize. 3. Aortic atherosclerosis. 4. 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 5. Fat containing left inguinal hernia.   Aortic Atherosclerosis (ICD10-I70.0).   Electronically Signed   By: TKerby MoorsM.D.   On: 09/26/2019 09:07 Within last 24 hrs: No results found.  Assessment     Patient Active Problem List   Diagnosis Date Noted   Left inguinal hernia 11/22/2021   [place]neurys[place]ac artery [place] 06/26/2021   Atherosclerosis of native arteries of extremity with intermittent claudication [place] 02/08/2021   History of colonic polyps    Polyp of colon    A[place]ros[place] of aorta ([place] 07/31/2020   Personal history of kidney cancer 02/06/2015   Benign essential HTN 08/02/2014   Coronary artery disease involving native coronary artery of native heart without angina pectoris 08/02/2014   Dyslipidemia 08/02/2014   H/O acute myocardial infarction 08/02/2014   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   Impotence of organic origin [id number]  Dysmetabolic syndrome [id number]  Arthritis, degenerative 08/02/2014   Peripheral vascular disease ([place] 08/02/2014   Blood glucose elevated 08/02/2014   OSA (obstructive sleep apnea) 08/02/2014   Vitamin D deficiency 08/02/2014   Vitiligo 08/02/2014    Plan    Robotic assisted laparoscopic left inguinal hernia repair.  I discussed possibility of incarceration, strangulation, enlargement in size over time, and the need for  emergency surgery in the face of these.  Also reviewed the techniques of reduction should  incarceration occur, and when unsuccessful to present to the ED.  Also discussed that surgery risks include recurrence which can be up to 30% in the case of complex hernias, use of prosthetic materials (mesh) and the increased risk of infection and the possible need for re-operation and removal of mesh, possibility of post-op SBO or ileus, and the risks of general anesthetic including heart attack, stroke,  or some reaction to anesthetic medications. The patient, and those present, appear to understand the risks, any and all questions were answered to the patient's satisfaction.  No guarantees were ever expressed or implied.   Face-to-face time spent with the patient and accompanying care providers(if present) was 30 minutes, with more than 50% of the time spent counseling, educating, and coordinating care of the patient.    These notes generated with voice recognition software. I apologize for typographical errors.  [name] M.D., FACS 02/27/2022, 1:09 PM

## 2022-02-26 NOTE — Progress Notes (Signed)
Patient ID: Roberto Walker, male   DOB: 03/05/1960, 61 y.o.   MRN: [id number]  Chief Complaint: Left inguinal hernia  History of Present Illness Roberto Walker is a 61 y.o. male with left inguinal hernia/left groin bulge present for the last 6 to 8 months.  Reports it first presented likely when lifting a little more than he should have.  Denies any back strain associated with that lifting event.  Reports his symptoms are relatively painless, just a little discomforting when the bulge protrudes, typically associated with physical activity.  Denies any pain with coughing or sneezing.  Denies any pain with bowel activity.  No history of nausea, vomiting, fevers or chills.    Past Medical History Past Medical History:  Diagnosis Date   Hypercalcemia    Hyperlipidemia    Hypertension    Metabolic syndrome    Microscopic hematuria    Osteoarthrosis    Peripheral vascular disease (HCC)    Prostate cancer (HCC)    Renal cell carcinoma (HCC)    Vitamin D deficiency    Vitiligo       Past Surgical History:  Procedure Laterality Date   Beam Radiation     Prostate using Photons   COLONOSCOPY WITH PROPOFOL N/A 09/18/2020   Procedure: COLONOSCOPY WITH PROPOFOL;  Surgeon: Tahiliani, Varnita B, MD;  Location: [facility] ENDOSCOPY;  Service: Endoscopy;  Laterality: N/A;   ILIAC VEIN ANGIOPLASTY / STENTING     LEFT HEART CATH  01/11/2012   LOWER EXTREMITY ANGIOGRAPHY Left 10/18/2020   Procedure: LOWER EXTREMITY ANGIOGRAPHY;  Surgeon: Dew, Jason S, MD;  Location: [facility] INVASIVE CV LAB;  Service: Cardiovascular;  Laterality: Left;   NEPHRECTOMY Right 2010   Partial    Allergies  Allergen Reactions   Lisinopril Swelling    Swelling around mouth, lips    Current Outpatient Medications  Medication Sig Dispense Refill   amLODipine-olmesartan (AZOR) 10-40 MG tablet TAKE 1 TABLET BY MOUTH DAILY 90 tablet 0   aspirin 81 MG tablet Take 1 tablet by mouth daily.     atorvastatin (LIPITOR) 80 MG tablet Take 1  tablet (80 mg total) by mouth daily. 90 tablet 1   carvedilol (COREG) 12.5 MG tablet Take 1 tablet (12.5 mg total) by mouth 2 (two) times daily. 180 tablet 1   cholecalciferol (VITAMIN D) 1000 UNITS tablet Take 1 tablet by mouth daily.     clopidogrel (PLAVIX) 75 MG tablet Take 1 tablet (75 mg total) by mouth daily. 90 tablet 1   nitroGLYCERIN (NITROSTAT) 0.4 MG SL tablet Place 1 tablet (0.4 mg total) under the tongue daily. (Patient taking differently: Place 0.4 mg under the tongue as needed.) 30 tablet 0   sildenafil (VIAGRA) 50 MG tablet Take by mouth as needed.     No current facility-administered medications for this visit.    Family History Family History  Problem Relation Age of Onset   Hypertension Mother    Lung cancer Father    Hypertension Father    Hyperlipidemia Father    Diabetes Sister        oldest sister   Hypertension Sister    Hypertension Sister       Social History Social History   Tobacco Use   Smoking status: Former    Packs/day: 0.50    Years: 20.00    Total pack years: 10.00    Types: Cigarettes    Start date: 1978    Quit date: 01/12/2012    Years since quitting: 10.1     Smokeless tobacco: Never  Vaping Use   Vaping Use: Never used  Substance Use Topics   Alcohol use: Yes    Alcohol/week: 2.0 standard drinks of alcohol    Types: 2 Shots of liquor per week    Comment: Minimal alcohol consumption   Drug use: No        Review of Systems  All other systems reviewed and are negative.    Physical Exam Blood pressure 124/74, pulse 60, temperature 98.8 F (37.1 C), temperature source Oral, height 6' (1.829 m), weight 182 lb 3.2 oz (82.6 kg), SpO2 96 %. Last Weight  Most recent update: 02/27/2022 11:13 AM    Weight  82.6 kg (182 lb 3.2 oz)             CONSTITUTIONAL: Well developed, and nourished, appropriately responsive and aware without distress.   EYES: Sclera non-icteric.   EARS, NOSE, MOUTH AND THROAT:  The oropharynx is clear.  Oral mucosa is pink and moist.    Hearing is intact to voice.  NECK: Trachea is midline, and there is no jugular venous distension.  LYMPH NODES:  Lymph nodes in the neck are not appreciated. RESPIRATORY:   Normal respiratory effort without pathologic use of accessory muscles. CARDIOVASCULAR:  Well perfused.  GI: The abdomen is soft, nontender, and nondistended. There were no other palpable masses.  GU: Readily reducible prominent left groin bulge with obvious asymmetry compared to the right. MUSCULOSKELETAL:  Symmetrical muscle tone appreciated in all four extremities.    SKIN: Skin turgor is normal. No pathologic skin lesions appreciated.  NEUROLOGIC:  Motor and sensation appear grossly normal.  Cranial nerves are grossly without defect. PSYCH:  Alert and oriented to person, place and time. Affect is appropriate for situation.  Data Reviewed I have personally reviewed what is currently available of the patient's imaging, recent labs and medical records.   Labs:     Latest Ref Rng & Units 07/18/2021    8:07 AM 07/31/2020   10:50 AM 07/28/2019    9:10 AM  CBC  WBC 3.8 - 10.8 Thousand/uL 6.8  9.1  6.8   Hemoglobin 13.2 - 17.1 g/dL 14.0  14.3  13.5   Hematocrit 38.5 - 50.0 % 40.2  41.1  39.5   Platelets 140 - 400 Thousand/uL 258  286  263       Latest Ref Rng & Units 07/18/2021    8:07 AM 10/18/2020   10:44 AM 07/31/2020   10:50 AM  CMP  Glucose 65 - 99 mg/dL 95   97   BUN 7 - 25 mg/dL 11  17  16   Creatinine 0.70 - 1.35 mg/dL 0.97  0.92  1.04   Sodium 135 - 146 mmol/L 139   138   Potassium 3.5 - 5.3 mmol/L 3.9   4.1   Chloride 98 - 110 mmol/L 109   110   CO2 20 - 32 mmol/L 21   20   Calcium 8.6 - 10.3 mg/dL 10.3   10.1   Total Protein 6.1 - 8.1 g/dL 7.3   7.3   Total Bilirubin 0.2 - 1.2 mg/dL 0.4   0.5   AST 10 - 35 U/L 28   25   ALT 9 - 46 U/L 25   32     Imaging: Radiological images reviewed:  CLINICAL DATA:  Hematuria.   EXAM: CT ABDOMEN AND PELVIS WITHOUT AND WITH  CONTRAST   TECHNIQUE: Multidetector CT imaging of the abdomen and pelvis was   performed following the standard protocol before and following the bolus administration of intravenous contrast.   CONTRAST:  125mL OMNIPAQUE IOHEXOL 300 MG/ML  SOLN   COMPARISON:  08/09/2008   FINDINGS: Lower chest: No acute abnormality.   Hepatobiliary: 1.6 cm cyst is identified along the posterior medial dome of right hepatic lobe, image 19/4. Within the anterior dome of liver there is a 0.8 x 0.7 cm low-density structure which is technically too small to reliably characterize but favored to represent a small cyst.   Gallbladder normal.  No biliary ductal dilatation.   Pancreas: Unremarkable. No pancreatic ductal dilatation or surrounding inflammatory changes.   Spleen: Normal in size without focal abnormality.   Adrenals/Urinary Tract: Normal appearance of the adrenal glands. Nonenhancing hyperdense cyst arising from posterior cortex of the left mid kidney measures 1.5 cm, image 35/4. Cyst within the anterior cortex of the left mid kidney measures 1.5 cm. Additional multiple subcentimeter intermediate and low attenuation foci are noted in both kidneys. These are too small to reliably characterize. Focal area of cortical calcification and scarring is noted within the upper pole of left kidney measuring 9 mm. No kidney stones identified bilaterally. Duplicated left renal collecting system. No suspicious filling defects identified within the collecting systems bilaterally. No hydroureter or ureteral lithiasis identified. Urinary bladder is unremarkable.   Stomach/Bowel: Small hiatal hernia. Stomach is otherwise unremarkable. Appendix appears normal. No evidence of bowel wall thickening, distention, or inflammatory changes.   Vascular/Lymphatic: Aortic atherosclerosis. No aneurysm. No enlarged abdominopelvic lymph nodes. 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    Reproductive: Several fiducial markers are noted within the prostate gland.   Other: No free fluid or fluid collections identified. Fat containing left inguinal hernia.   Musculoskeletal: No acute or significant osseous findings.   IMPRESSION: 1. No findings identified to explain patient's hematuria. No kidney stones identified bilaterally. 2. Left kidney Bosniak category 1 and 2 cysts. Additional, bilateral kidney lesions are less than 1 cm and too small to reliably characterize. 3. Aortic atherosclerosis. 4. 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 5. Fat containing left inguinal hernia.   Aortic Atherosclerosis (ICD10-I70.0).   Electronically Signed   By: Taylor  Stroud M.D.   On: 09/26/2019 09:07 Within last 24 hrs: No results found.  Assessment     Patient Active Problem List   Diagnosis Date Noted   Left inguinal hernia 11/22/2021   Aneurysm of right iliac artery (HCC) 06/26/2021   Atherosclerosis of native arteries of extremity with intermittent claudication (HCC) 02/08/2021   History of colonic polyps    Polyp of colon    Atherosclerosis of aorta (HCC) 07/31/2020   Personal history of kidney cancer 02/06/2015   Benign essential HTN 08/02/2014   Coronary artery disease involving native coronary artery of native heart without angina pectoris 08/02/2014   Dyslipidemia 08/02/2014   H/O acute myocardial infarction 08/02/2014   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/02/2014   Dysmetabolic syndrome 08/02/2014   Arthritis, degenerative 08/02/2014   Peripheral vascular disease (HCC) 08/02/2014   Blood glucose elevated 08/02/2014   OSA (obstructive sleep apnea) 08/02/2014   Vitamin D deficiency 08/02/2014   Vitiligo 08/02/2014    Plan    Robotic assisted laparoscopic left inguinal hernia repair.  I discussed possibility of incarceration, strangulation, enlargement in size over time, and the need for  emergency surgery in the face of these.  Also reviewed the techniques of reduction should   incarceration occur, and when unsuccessful to present to the ED.  Also discussed that surgery risks include recurrence which can be up to 30% in the case of complex hernias, use of prosthetic materials (mesh) and the increased risk of infection and the possible need for re-operation and removal of mesh, possibility of post-op SBO or ileus, and the risks of general anesthetic including heart attack, stroke,  or some reaction to anesthetic medications. The patient, and those present, appear to understand the risks, any and all questions were answered to the patient's satisfaction.  No guarantees were ever expressed or implied.   Face-to-face time spent with the patient and accompanying care providers(if present) was 30 minutes, with more than 50% of the time spent counseling, educating, and coordinating care of the patient.    These notes generated with voice recognition software. I apologize for typographical errors.  Donnesha Karg M.D., FACS 02/27/2022, 1:09 PM     

## 2022-02-27 ENCOUNTER — Encounter: Payer: Self-pay | Admitting: Surgery

## 2022-02-27 ENCOUNTER — Ambulatory Visit: Payer: Self-pay | Admitting: Surgery

## 2022-02-27 ENCOUNTER — Ambulatory Visit (INDEPENDENT_AMBULATORY_CARE_PROVIDER_SITE_OTHER): Payer: Federal, State, Local not specified - PPO | Admitting: Surgery

## 2022-02-27 VITALS — BP 124/74 | HR 60 | Temp 98.8°F | Ht 72.0 in | Wt 182.2 lb

## 2022-02-27 DIAGNOSIS — K4090 Unilateral inguinal hernia, without obstruction or gangrene, not specified as recurrent: Secondary | ICD-10-CM

## 2022-02-27 NOTE — Patient Instructions (Addendum)
Our surgery scheduler Barbara will call you within 24-48 hours to get you scheduled. If you have not heard from her after 48 hours, please call our office. Have the blue sheet available when she calls to write down important information.   If you have any concerns or questions, please feel free to call our office.   Laparoscopic Inguinal Hernia Repair, Adult Laparoscopic inguinal hernia repair is a surgical procedure to repair a small, weak spot in the groin muscles that allows fat or intestines from inside the abdomen to bulge out (inguinal hernia). This procedure may be planned, or it may be an emergency procedure. During the procedure, tissue that has bulged out is moved back into place, and the opening in the groin muscles is repaired. This is done through three small incisions in the abdomen. A thin tube with a light and camera on the end (laparoscope) is used to help perform the procedure. Tell a health care provider about: Any allergies you have. All medicines you are taking, including vitamins, herbs, eye drops, creams, and over-the-counter medicines. Any problems you or family members have had with anesthetic medicines. Any blood disorders you have. Any surgeries you have had. Any medical conditions you have. Whether you are pregnant or may be pregnant. What are the risks? Generally, this is a safe procedure. However, problems may occur, including: Infection. Bleeding. Allergic reactions to medicines. Damage to nearby structures or organs. Testicle damage or long-term pain and swelling of the scrotum, in males. Inability to completely empty the bladder (urinary retention). Blood clots. A collection of fluid that builds up under the skin (seroma). The hernia coming back (recurrence). What happens before the procedure? Staying hydrated Follow instructions from your health care provider about hydration, which may include: Up to 2 hours before the procedure - you may continue to  drink clear liquids, such as water, clear fruit juice, black coffee, and plain tea.  Eating and drinking restrictions Follow instructions from your health care provider about eating and drinking, which may include: 8 hours before the procedure - stop eating heavy meals or foods, such as meat, fried foods, or fatty foods. 6 hours before the procedure - stop eating light meals or foods, such as toast or cereal. 6 hours before the procedure - stop drinking milk or drinks that contain milk. 2 hours before the procedure - stop drinking clear liquids. Medicines Ask your health care provider about: Changing or stopping your regular medicines. This is especially important if you are taking diabetes medicines or blood thinners. Taking medicines such as aspirin and ibuprofen. These medicines can thin your blood. Do not take these medicines unless your health care provider tells you to take them. Taking over-the-counter medicines, vitamins, herbs, and supplements. General instructions Do not use any products that contain nicotine or tobacco for at least 4 weeks before the procedure, if possible. These products include cigarettes, chewing tobacco, and vaping devices, such as e-cigarettes. If you need help quitting, ask your health care provider. Ask your health care provider: How your surgery site will be marked. What steps will be taken to help prevent infection. These steps may include: Removing hair at the surgery site. Washing skin with a germ-killing soap. Taking antibiotic medicine. Plan to have a responsible adult take you home from the hospital or clinic. Plan to have a responsible adult care for you for the time you are told after you leave the hospital or clinic. This is important. What happens during the procedure? An IV will   be inserted into one of your veins. You will be given one or more of the following: A medicine to help you relax (sedative). A medicine to make you fall asleep  (general anesthetic). Three small incisions will be made in your abdomen. Your abdomen will be inflated with carbon dioxide gas to make the surgical area easier to see. A laparoscope and surgical instruments will be inserted through the incisions. The laparoscope will send images of the inside of your abdomen to a monitor in the room. Tissue that is bulging through the hernia may be removed or moved back into place. The hernia opening will be closed with a sheet of surgical mesh. The surgical instruments and laparoscope will be removed. Your incisions will be closed with stitches (sutures) and adhesive strips. A bandage (dressing) will be placed over your incisions. The procedure may vary among health care providers and hospitals. What happens after the procedure? Your blood pressure, heart rate, breathing rate, and blood oxygen level will be monitored until you leave the hospital or clinic. You will be given pain medicine as needed. You may continue to receive medicines and fluids through an IV. The IV will be removed after you can drink fluids. You will be encouraged to get up and move around and to take deep breaths frequently. If you were given a sedative during the procedure, it can affect you for several hours. Do not drive or operate machinery until your health care provider says that it is safe. Summary Laparoscopic inguinal hernia repair is a surgical procedure to repair a small, weak spot in the groin muscles that allows fat or intestines from inside the abdomen to bulge out (inguinal hernia). This procedure is done through three small incisions in the abdomen. A thin tube with a light and camera on the end (laparoscope) is used to help perform the procedure. After the procedure, you will be encouraged to get up and move around and to take deep breaths frequently. This information is not intended to replace advice given to you by your health care provider. Make sure you discuss any  questions you have with your health care provider. Document Revised: 09/20/2019 Document Reviewed: 09/20/2019 Elsevier Patient Education  2023 Elsevier Inc.  

## 2022-02-28 ENCOUNTER — Telehealth: Payer: Self-pay | Admitting: Surgery

## 2022-02-28 NOTE — Telephone Encounter (Signed)
Patient has been advised of Pre-Admission date/time, and Surgery date at [facility].  Surgery Date: 03/21/22 Preadmission Testing Date: 03/12/22 (phone 1p-5p)  Patient has been made aware to call [phone], between 1-3:00pm the day before surgery, to find out what time to arrive for surgery.

## 2022-03-12 ENCOUNTER — Telehealth: Payer: Self-pay | Admitting: *Deleted

## 2022-03-12 ENCOUNTER — Encounter
Admission: RE | Admit: 2022-03-12 | Discharge: 2022-03-12 | Disposition: A | Discharge status: Home / Self Care | Payer: Federal, State, Local not specified - PPO | Source: Ambulatory Visit | Attending: Surgery | Admitting: Surgery

## 2022-03-12 DIAGNOSIS — I10 Essential (primary) hypertension: Secondary | ICD-10-CM

## 2022-03-12 DIAGNOSIS — Z01810 Encounter for preprocedural cardiovascular examination: Secondary | ICD-10-CM

## 2022-03-12 DIAGNOSIS — I2510 Atherosclerotic heart disease of native coronary artery without angina pectoris: Secondary | ICD-10-CM

## 2022-03-12 DIAGNOSIS — I252 Old myocardial infarction: Secondary | ICD-10-CM

## 2022-03-12 HISTORY — DX: Metabolic syndrome: E88.810

## 2022-03-12 HISTORY — DX: Atherosclerotic heart disease of native coronary artery without angina pectoris: I25.10

## 2022-03-12 HISTORY — DX: Sleep apnea, unspecified: G47.30

## 2022-03-12 HISTORY — DX: Atherosclerosis of aorta: I70.0

## 2022-03-12 HISTORY — DX: Unilateral inguinal hernia, without obstruction or gangrene, not specified as recurrent: K40.90

## 2022-03-12 HISTORY — DX: Aneurysm of iliac artery: I72.3

## 2022-03-12 HISTORY — DX: Peripheral vascular disease, unspecified: I73.9

## 2022-03-12 NOTE — Patient Instructions (Addendum)
Your procedure is scheduled on:03-21-22 Friday Report to the Registration Desk on the 1st floor of the [facility] [facility].Then proceed to the 2nd floor Surgery Desk To find out your arrival time, please call [phone] between 1PM - 3PM on:03-20-22 Thursday If your arrival time is 6:00 am, do not arrive before that time as the [place] entrance doors do not open until 6:00 am.  REMEMBER: Instructions that are not followed completely may result in serious medical risk, up to and including death; or upon the discretion of your surgeon and anesthesiologist your surgery may need to be rescheduled.  Do not eat food OR drink any liquids after midnight the night before surgery.  No gum chewing or hard candies.  One week prior to surgery: Stop Anti-inflammatories (NSAIDS) such as Advil, Aleve, Ibuprofen, Motrin, Naproxen, Naprosyn and Aspirin based products such as Excedrin, Goody's Powder, BC Powder. Stop ANY OVER THE COUNTER supplements/vitamins 7 days prior to surgery  You may however, continue to take Tylenol if needed for pain up until the day of surgery.  Continue taking all prescribed medications with the exception of the following:Vitamin D  Stop your clopidogrel (PLAVIX) 5 days prior to surgery-Last dose will be on 03-15-22 Saturday  Continue your 81 mg Aspirin up until the day prior to surgery-Do NOT take the day of surgery  [place] A SIP OF WATER:  carvedilol (COREG)   No Alcohol for 24 hours before or after surgery.  No Smoking including e-cigarettes for 24 hours before surgery.  No chewable tobacco products for at least 6 hours before surgery.  No nicotine patches on the day of surgery.  Do not use any "recreational" drugs for at least a week (preferably 2 weeks) before your surgery.  Please be advised that the combination of cocaine and anesthesia may have negative outcomes, up to and including death. If you test positive for  cocaine, your surgery will be cancelled.  On the morning of surgery brush your teeth with toothpaste and water, you may rinse your mouth with mouthwash if you wish. Do not swallow any toothpaste or mouthwash.  Use CHG Soap as directed on instruction sheet.  Do not wear jewelry, make-up, hairpins, clips or nail polish.  Do not wear lotions, powders, or perfumes.   Do not shave body hair from the neck down 48 hours before surgery.  Contact lenses, hearing aids and dentures may not be worn into surgery.  Do not bring valuables to the hospital. [facility] is not responsible for any missing/lost belongings or valuables.   Notify your doctor if there is any change in your medical condition (cold, fever, infection).  Wear comfortable clothing (specific to your surgery type) to the hospital.  After surgery, you can help prevent lung complications by doing breathing exercises.  Take deep breaths and cough every 1-2 hours. Your doctor may order a device called an Incentive Spirometer to help you take deep breaths. When coughing or sneezing, hold a pillow firmly against your incision with both hands. This is called "splinting." Doing this helps protect your incision. It also decreases belly discomfort.  If you are being admitted to the hospital overnight, leave your suitcase in the car. After surgery it may be brought to your room.  In case of increased patient census, it may be necessary for you, the patient, to continue your postoperative care in the Same Day Surgery department.  If you are being discharged the day of surgery,  you will not be allowed to drive home. You will need a responsible individual to drive you home and stay with you for 24 hours after surgery.   If you are taking public transportation, you will need to have a responsible individual with you.  Please call the [facility]. at [phone] if you have any questions about these instructions.  Surgery  Visitation Policy:  Patients undergoing a surgery or procedure may have two family members or support persons with them as long as the person is not COVID-19 positive or experiencing its symptoms.   Due to an increase in RSV and influenza rates and associated hospitalizations, children ages 41 and under will not be able to visit patients in [facility]. Masks continue to be strongly recommended.

## 2022-03-12 NOTE — Telephone Encounter (Signed)
   Name: Roberto Walker  DOB: 06-Aug-1960  MRN: [id number]  Primary Cardiologist: None   Preoperative team, please contact this patient and set up a phone call appointment for further preoperative risk assessment. Please obtain consent and complete medication review. Thank you for your help.  I confirm that guidance regarding antiplatelet and oral anticoagulation therapy has been completed and, if necessary, noted below.  Per office protocol, if patient is without any new symptoms or concerns at the time of their virtual visit, he/she may hold Plavix for 5 days prior to procedure. Please resume Plavix as soon as possible postprocedure, at the discretion of the surgeon. Regarding ASA therapy, we recommend continuation of ASA throughout the perioperative period.  However, if the surgeon feels that cessation of ASA is required in the perioperative period, it may be stopped 5-7 days prior to surgery with a plan to resume it as soon as felt to be feasible from a surgical standpoint in the post-operative period.   Lenna Sciara, NP 03/12/2022, 3:55 PM [place]

## 2022-03-12 NOTE — Telephone Encounter (Signed)
-----   Message from Karen Kitchens, NP sent at 03/12/2022  3:39 PM EST ----- Regarding: Request for pre-operative cardiac clearance Request for pre-operative cardiac clearance:  1. What type of surgery is being performed?  XI ROBOTIC ASSISTED INGUINAL HERNIA  2. When is this surgery scheduled?  03/21/2022  3. Type of clearance being requested (medical, pharmacy, both)? BOTH   4. Are there any medications that need to be held prior to surgery? ASA + clopidogrel  5. Practice name and name of physician performing surgery?  Performing surgeon: Dr. [name], MD Requesting clearance: Honor Loh, FNP-C    6. Anesthesia type (none, local, MAC, general)? GENERAL  7. What is the office phone and fax number?   Phone: [phone] Fax: [phone]  ATTENTION: Unable to create telephone message as per your standard workflow. Directed by [facility] providers to send requests for cardiac clearance to this pool for appropriate distribution to provider covering pre-operative clearances.   Honor Loh, MSN, APRN, FNP-C, CEN [facility]  Peri-operative Services Nurse Practitioner Phone: [phone] 03/12/22 3:39 PM

## 2022-03-13 ENCOUNTER — Telehealth: Payer: Self-pay | Admitting: *Deleted

## 2022-03-13 ENCOUNTER — Encounter
Admission: RE | Admit: 2022-03-13 | Discharge: 2022-03-13 | Disposition: A | Discharge status: Home / Self Care | Payer: Federal, State, Local not specified - PPO | Source: Ambulatory Visit | Attending: Surgery | Admitting: Surgery

## 2022-03-13 DIAGNOSIS — I2510 Atherosclerotic heart disease of native coronary artery without angina pectoris: Secondary | ICD-10-CM | POA: Insufficient documentation

## 2022-03-13 DIAGNOSIS — I252 Old myocardial infarction: Secondary | ICD-10-CM | POA: Diagnosis not present

## 2022-03-13 DIAGNOSIS — I498 Other specified cardiac arrhythmias: Secondary | ICD-10-CM | POA: Diagnosis not present

## 2022-03-13 DIAGNOSIS — Z01810 Encounter for preprocedural cardiovascular examination: Secondary | ICD-10-CM | POA: Diagnosis not present

## 2022-03-13 DIAGNOSIS — K4090 Unilateral inguinal hernia, without obstruction or gangrene, not specified as recurrent: Secondary | ICD-10-CM | POA: Diagnosis not present

## 2022-03-13 DIAGNOSIS — I10 Essential (primary) hypertension: Secondary | ICD-10-CM | POA: Insufficient documentation

## 2022-03-13 DIAGNOSIS — Z01818 Encounter for other preprocedural examination: Secondary | ICD-10-CM | POA: Insufficient documentation

## 2022-03-13 DIAGNOSIS — I4510 Unspecified right bundle-branch block: Secondary | ICD-10-CM | POA: Diagnosis not present

## 2022-03-13 LAB — CBC WITH DIFFERENTIAL/PLATELET
Abs Immature Granulocytes: 0.01 10*3/uL (ref 0.00–0.07)
Basophils Absolute: 0 10*3/uL (ref 0.0–0.1)
Basophils Relative: 0 %
Eosinophils Absolute: 0.1 10*3/uL (ref 0.0–0.5)
Eosinophils Relative: 2 %
HCT: 39.7 % (ref 39.0–52.0)
Hemoglobin: 13.4 g/dL (ref 13.0–17.0)
Immature Granulocytes: 0 %
Lymphocytes Relative: 42 %
Lymphs Abs: 2.6 10*3/uL (ref 0.7–4.0)
MCH: 31.6 pg (ref 26.0–34.0)
MCHC: 33.8 g/dL (ref 30.0–36.0)
MCV: 93.6 fL (ref 80.0–100.0)
Monocytes Absolute: 0.5 10*3/uL (ref 0.1–1.0)
Monocytes Relative: 8 %
Neutro Abs: 3.1 10*3/uL (ref 1.7–7.7)
Neutrophils Relative %: 48 %
Platelets: 276 10*3/uL (ref 150–400)
RBC: 4.24 MIL/uL (ref 4.22–5.81)
RDW: 15.1 % (ref 11.5–15.5)
WBC: 6.4 10*3/uL (ref 4.0–10.5)
nRBC: 0 % (ref 0.0–0.2)

## 2022-03-13 LAB — COMPREHENSIVE METABOLIC PANEL
ALT: 18 U/L (ref 0–44)
AST: 23 U/L (ref 15–41)
Albumin: 3.9 g/dL (ref 3.5–5.0)
Alkaline Phosphatase: 58 U/L (ref 38–126)
Anion gap: 8 (ref 5–15)
BUN: 16 mg/dL (ref 8–23)
CO2: 23 mmol/L (ref 22–32)
Calcium: 9.4 mg/dL (ref 8.9–10.3)
Chloride: 105 mmol/L (ref 98–111)
Creatinine, Ser: 0.92 mg/dL (ref 0.61–1.24)
GFR, Estimated: >60 mL/min (ref >60)
Glucose, Bld: 116 mg/dL — ABNORMAL HIGH (ref 70–99)
Potassium: 3.5 mmol/L (ref 3.5–5.1)
Sodium: 136 mmol/L (ref 135–145)
Total Bilirubin: 0.7 mg/dL (ref 0.3–1.2)
Total Protein: 7.4 g/dL (ref 6.5–8.1)

## 2022-03-13 NOTE — Telephone Encounter (Signed)
Pt is returning call and is requesting return call.

## 2022-03-13 NOTE — Telephone Encounter (Signed)
I was able to s.w pt and have set up tele pre op appt add on due to med hold and procedure date. Pt scheduled for 03/14/22 @ 10:40. Med rec and consent are done.     Patient Consent for Virtual Visit        Roberto Walker has provided verbal consent on 03/13/2022 for a virtual visit (video or telephone).   CONSENT FOR VIRTUAL VISIT FOR:  Roberto Walker  By participating in this virtual visit I agree to the following:  I hereby voluntarily request, consent and authorize [facility] and its employed or contracted physicians, physician assistants, nurse practitioners or other licensed health care professionals (the Practitioner), to provide me with telemedicine health care services (the "Services") as deemed necessary by the treating Practitioner. I acknowledge and consent to receive the Services by the Practitioner via telemedicine. I understand that the telemedicine visit will involve communicating with the Practitioner through live audiovisual communication technology and the disclosure of certain medical information by electronic transmission. I acknowledge that I have been given the opportunity to request an in-person assessment or other available alternative prior to the telemedicine visit and am voluntarily participating in the telemedicine visit.  I understand that I have the right to withhold or withdraw my consent to the use of telemedicine in the course of my care at any time, without affecting my right to future care or treatment, and that the Practitioner or I may terminate the telemedicine visit at any time. I understand that I have the right to inspect all information obtained and/or recorded in the course of the telemedicine visit and may receive copies of available information for a reasonable fee.  I understand that some of the potential risks of receiving the Services via telemedicine include:  Delay or interruption in medical evaluation due to technological equipment failure or  disruption; Information transmitted may not be sufficient (e.g. poor resolution of images) to allow for appropriate medical decision making by the Practitioner; and/or  In rare instances, security protocols could fail, causing a breach of personal health information.  Furthermore, I acknowledge that it is my responsibility to provide information about my medical history, conditions and care that is complete and accurate to the best of my ability. I acknowledge that Practitioner's advice, recommendations, and/or decision may be based on factors not within their control, such as incomplete or inaccurate data provided by me or distortions of diagnostic images or specimens that may result from electronic transmissions. I understand that the practice of medicine is not an exact science and that Practitioner makes no warranties or guarantees regarding treatment outcomes. I acknowledge that a copy of this consent can be made available to me via my patient portal ([facility]), or I can request a printed copy by calling the office of [facility].    I understand that my insurance will be billed for this visit.   I have read or had this consent read to me. I understand the contents of this consent, which adequately explains the benefits and risks of the Services being provided via telemedicine.  I have been provided ample opportunity to ask questions regarding this consent and the Services and have had my questions answered to my satisfaction. I give my informed consent for the services to be provided through the use of telemedicine in my medical care

## 2022-03-13 NOTE — Telephone Encounter (Signed)
I just called the pt right back and he did not answer the phone. Pt will need to be added on for tele pre op appt.

## 2022-03-13 NOTE — Progress Notes (Signed)
Virtual Visit via Telephone Note   Because of Roberto Walker co-morbid illnesses, he is at least at moderate risk for complications without adequate follow up.  This format is felt to be most appropriate for this patient at this time.  The patient did not have access to video technology/had technical difficulties with video requiring transitioning to audio format only (telephone).  All issues noted in this document were discussed and addressed.  No physical exam could be performed with this format.  Please refer to the patient's chart for his consent to telehealth for [facility].  Evaluation Performed:  Preoperative cardiovascular risk assessment _____________   Date:  03/13/2022   Patient ID:  Roberto Walker, DOB 03-29-1960, MRN [id number] Patient Location:  Home Provider location:   Office  Primary Care Provider:  Steele Sizer, MD Primary Cardiologist:  None  Chief Complaint / Patient Profile   62 y.o. y/o male with a h/o CAD/MI (s/p PCI to LAD 2013, RCA 80%)hypertension, hyperlipidemia, PAD (s/p PCI 2015, right common iliac artery aneurysm), former smoker x20+ years  who is pending robotic assisted inguinal hernia repair and presents today for telephonic preoperative cardiovascular risk assessment.  History of Present Illness    Roberto Walker is a 62 y.o. male who presents via audio/video conferencing for a telehealth visit today.  Pt was last seen in cardiology clinic on 07/04/2021 by Dr. Garen Walker.  At that time Roberto Walker was doing well with no chest pain and well-controlled blood pressure.  The patient is now pending procedure as outlined above. Since his last visit, he is doing well with no new cardiac complaints since his previous visit.    He denies chest pain, shortness of breath, lower extremity edema, fatigue, palpitations, melena, hematuria, hemoptysis, diaphoresis, weakness, presyncope, syncope, orthopnea, and PND.   Plavix for 5 days prior to procedure.  Please resume Plavix as soon as possible postprocedure, at the discretion of the surgeon. Regarding ASA therapy, we recommend continuation of ASA throughout the perioperative period.     Past Medical History    Past Medical History:  Diagnosis Date   Aneurysm of right iliac artery (HCC)    Atherosclerosis of aorta (HCC)    Coronary artery disease    Dysmetabolic syndrome    Hypercalcemia    Hyperlipidemia    Hypertension    Left inguinal hernia    Metabolic syndrome    Microscopic hematuria    Myocardial infarction [facility]) 2013   Osteoarthrosis    Peripheral vascular disease (HCC)    Prostate cancer (HCC)    PVD (peripheral vascular disease) ([place])    Renal cell carcinoma (HCC)    Sleep apnea    does not wear cpap   Vitamin D deficiency    Vitamin D deficiency    Vitiligo    Past Surgical History:  Procedure Laterality Date   Beam Radiation     Prostate using Photons   COLONOSCOPY WITH PROPOFOL N/A 09/18/2020   Procedure: COLONOSCOPY WITH PROPOFOL;  Surgeon: Roberto Manifold, MD;  Location: [facility] ENDOSCOPY;  Service: Endoscopy;  Laterality: N/A;   ILIAC VEIN ANGIOPLASTY / STENTING  2013   LEFT HEART CATH  01/11/2012   LOWER EXTREMITY ANGIOGRAPHY Left 10/18/2020   Procedure: LOWER EXTREMITY ANGIOGRAPHY;  Surgeon: Roberto Huxley, MD;  Location: [facility];  Service: Cardiovascular;  Laterality: Left;   NEPHRECTOMY Right 02/04/2008   Partial    Allergies  Allergies  Allergen Reactions   Lisinopril  Swelling    Swelling around mouth, lips    Home Medications    Prior to Admission medications   Medication Sig Start Date End Date Taking? Authorizing Provider  amLODipine-olmesartan (AZOR) 10-40 MG tablet TAKE 1 TABLET BY MOUTH DAILY Patient taking differently: Take 1 tablet by mouth every morning. 10/10/21   Roberto Sizer, MD  aspirin 81 MG tablet Take 1 tablet by mouth daily. 10/19/08   [provider]  atorvastatin (LIPITOR) 80 MG tablet Take 1  tablet (80 mg total) by mouth daily. Patient taking differently: Take 80 mg by mouth at bedtime. 01/20/18   Roberto Sizer, MD  carvedilol (COREG) 12.5 MG tablet Take 1 tablet (12.5 mg total) by mouth 2 (two) times daily. 01/20/18   Roberto Sizer, MD  cholecalciferol (VITAMIN D) 1000 UNITS tablet Take 1 tablet by mouth daily. 01/16/09   [provider]  clopidogrel (PLAVIX) 75 MG tablet Take 1 tablet (75 mg total) by mouth daily. 01/20/18   Roberto Sizer, MD  nitroGLYCERIN (NITROSTAT) 0.4 MG SL tablet Place 1 tablet (0.4 mg total) under the tongue daily. Patient not taking: Reported on 03/13/2022 07/28/18   Roberto Sizer, MD  sildenafil (VIAGRA) 50 MG tablet Take 50 mg by mouth as needed. 12/22/19   [provider]    Physical Exam    Vital Signs:  Roberto Walker does not have vital signs available for review today.  Given telephonic nature of communication, physical exam is limited. AAOx3. NAD. Normal affect.  Speech and respirations are unlabored.  Accessory Clinical Findings    None  Assessment & Plan    1.  Preoperative Cardiovascular Risk Assessment  The patient affirms he has been doing well without any new cardiac symptoms. They are able to achieve 6 METS without cardiac limitations. Therefore, based on ACC/AHA guidelines, the patient would be at acceptable risk for the planned procedure without further cardiovascular testing. The patient was advised that if he develops new symptoms prior to surgery to contact our office to arrange for a follow-up visit, and he verbalized understanding.  .hc Roberto Walker perioperative risk of a major cardiac event is 6.6% according to the Revised Cardiac Risk Index (RCRI).  Therefore, he is at high risk for perioperative complications.   His functional capacity is fair at 6.05 METs according to the Duke Activity Status Index (DASI). Recommendations: According to ACC/AHA guidelines, no further cardiovascular testing needed.   The patient may proceed to surgery at acceptable risk.   Antiplatelet and/or Anticoagulation Recommendations:  Aspirin can be held for 1 days prior to his surgery.  Please resume Aspirin post operatively when it is felt to be safe from a bleeding standpoint.  Clopidogrel (Plavix) can be held for 5 days prior to his surgery and resumed as soon as possible post op.   The patient was advised that if he develops new symptoms prior to surgery to contact our office to arrange for a follow-up visit, and he verbalized understanding.  Time:   Today, I have spent 6 minutes with the patient with telehealth technology discussing medical history, symptoms, and management plan.     Mable Fill, Marissa Nestle, NP  03/13/2022, 11:51 AM

## 2022-03-13 NOTE — Telephone Encounter (Signed)
I was able to s.w pt and have set up tele pre op appt add on due to med hold and procedure date. Pt scheduled for 03/14/22 @ 10:40. Med rec and consent are done.

## 2022-03-13 NOTE — Telephone Encounter (Signed)
Left message to call back for tele pre op appt. Pt will need to be added on to today or tomorrow due to med hold and date of procedure.

## 2022-03-14 ENCOUNTER — Ambulatory Visit: Payer: Federal, State, Local not specified - PPO | Attending: Cardiology

## 2022-03-14 DIAGNOSIS — Z01810 Encounter for preprocedural cardiovascular examination: Secondary | ICD-10-CM | POA: Diagnosis not present

## 2022-03-17 ENCOUNTER — Telehealth: Payer: Self-pay

## 2022-03-17 NOTE — Telephone Encounter (Signed)
Cardiac Clearance received -moderate risk w/o adequate workup. Hold Plavix for 5 days. Prior to procedure. Resume Plavix as soon as possible postprocedure. Recommend ASA continuation.

## 2022-03-18 ENCOUNTER — Encounter: Payer: Self-pay | Admitting: Surgery

## 2022-03-18 NOTE — Progress Notes (Signed)
Perioperative / Anesthesia Services  Pre-Admission Testing Clinical Review / Preoperative Anesthesia Consult  Date: 03/19/22  Patient Demographics:  Name: Roberto [name] DOB:   [birth year] MRN:   [id number]  Planned Surgical Procedure(s):    Case: [id number] Date/Time: 03/21/22 0919   Procedure: XI ROBOTIC ASSISTED INGUINAL HERNIA (Left)   Anesthesia type: General   Pre-op diagnosis: inguinal hernia   Location: [facility] OR ROOM 04 / [place] [facility]S FOR ANESTHESIA GROUP   Surgeons: Roberto Bacon, MD   NOTE: Available PAT nursing documentation and vital signs have been reviewed. Clinical nursing staff has updated patient's PMH/PSHx, current medication list, and drug allergies/intolerances to ensure comprehensive history available to assist in medical decision making as it pertains to the aforementioned surgical procedure and anticipated anesthetic course. Extensive review of available clinical information personally performed. [name] [facility] and PSHx updated with any diagnoses/procedures that  may have been inadvertently omitted during his intake with the pre-admission testing department's nursing staff.  Clinical Discussion:  Roberto [name] is a 62 y.o. male who is submitted for pre-surgical anesthesia review and clearance prior to him undergoing the above procedure. Patient is a Former Smoker (10 pack years; quit 01/2012). Pertinent PMH includes: CAD, STEMI, HFpEF, RBBB, PVD, aortic atherosclerosis, BILATERAL carotid artery disease, aneurysm of the RIGHT iliac artery, renal cell carcinoma, prostate cancer, LEFT inguinal hernia, erectile dysfunction (on PDE5i), OA  Patient is followed by cardiology Roberto Lah, MD). He was last seen in the cardiology clinic on 07/04/2021; notes reviewed. At the time of his clinic visit, patient doing well overall from a cardiovascular perspective. Patient denied any chest [name], shortness of breath, PND, orthopnea, palpitations, significant peripheral edema, weakness,  fatigue, vertiginous symptoms, or presyncope/syncope. Patient with a past medical history significant for cardiovascular diagnoses. Documented physical exam was grossly benign, providing no evidence of acute exacerbation and/or decompensation of the patient's known cardiovascular conditions.  Patient suffered a anterolateral wall STEMI on 01/11/2012.  Diagnostic LEFT heart catheterization was performed revealing multivessel CAD; 80% proximal RCA, 80% mid RCA, 30% RPDA, 40% proximal LCx, 20% OM1, 100% mid LAD, and 20% D1. There was thrombotic occlusion present.  Patient underwent mechanical thrombectomy followed by PTCA of the mid LAD.  PCI was then performed placing a 3.5 x 18 mm Xience DES x 1 within the mid LAD.  Procedure yielded excellent angiographic result and TIMI-3 flow.  Last TTE was performed on 10/23/2020 revealing a low normal left ventricular systolic function with an EF of 50-55%.  There were no regional wall motion abnormalities noted.  There was mild LVH. Left ventricular diastolic Doppler parameters consistent with abnormal relaxation (G1DD).  Right ventricular size and function normal.  There was mild to moderate biatrial enlargement.  There was mild mitral valve regurgitation.  All transvalvular gradients were noted to be normal providing no evidence suggestive of valvular stenosis.  Patient with known carotid artery stenosis.  Last carotid duplex was performed on 11/08/2020 revealing minimal/mild stenosis (1-39%) of the BILATERAL internal carotid arteries.  Vertebral arteries demonstrate antegrade flow.  Blood pressure reasonably controlled at 136/90 mmHg on currently prescribed CCB (amlodipine), ARB (olmesartan), and beta-blocker (carvedilol) therapies.  Patient is on atorvastatin for his HLD diagnosis and ASCVD prevention. Patient is not diabetic.  Patient has a supply of short acting nitrates (NTG) to use on a as needed basis for recurrent angina/anginal equivalent symptoms; denied  recent use.  Of note, in the setting of known cardiovascular diagnoses and concurrent nitrate use, it is important to note that  patient is on a PDE5i (sildenafil) for and erectile dysfunction diagnosis. He does not have an OSAH diagnosis. Functional capacity, as defined by DASI, is documented as being >/= 4 METS. No changes were made to his medication regimen during his visit with cardiology.  Patient scheduled to follow-up with outpatient cardiology in 1 year or sooner if needed.  Roberto [name] is scheduled for an elective LEFT XI ROBOTIC ASSISTED INGUINAL HERNIA repair on 03/21/2022 with Dr. [name], MD. Given patient's past medical history significant for cardiovascular diagnoses, presurgical cardiac clearance was sought by the PAT team.  Per cardiology, "[name]'s perioperative risk of a major cardiac event is 6.6% according to the RCRI therefore, he is at high risk for perioperative complications. His functional capacity is fair at 6.05 METs according to the DASI.  Therefore, according to ACC/AHA guidelines, no further cardiovascular testing needed.  The patient may proceed to surgery at an ACCEPTABLE risk".  Again, this patient is on daily DAPT therapy.  He has been instructed on recommendations from his cardiologist for holding his clopidogrel dose for 5 days prior to his procedure with plans to restart since postoperatively respectively minimized by his primary attending surgeon.  Patient is aware that his last dose of clopidogrel should be on 03/15/2022.  The patient will continue his daily low-dose ASA throughout his perioperative course.  Patient denies previous perioperative complications with anesthesia in the past. In review of the available records, it is noted that patient underwent a general anesthetic course here at [facility] (ASA III) in 09/2020 without documented complications.      02/27/2022   11:12 AM 02/25/2022    9:43 AM 11/22/2021     8:57 AM  Vitals with BMI  Height 6' 0"$  6' 0"$  6' 0"$   Weight 182 lbs 3 oz 185 lbs 2 oz 182 lbs  BMI 24.71 [id number]  Systolic [id number]  Diastolic 74 70 68  Pulse 60 53 63    Providers/Specialists:   NOTE: Primary physician provider listed below. Patient may have been seen by APP or partner within same practice.   PROVIDER ROLE / SPECIALTY LAST Roberto Apo, MD General Surgery (Surgeon) 02/27/2022  Roberto Sizer, MD Primary Care Provider 02/25/2022  Roberto Sable, MD Cardiology 07/04/2021  Roberto Pain, MD Vascular Surgery 08/09/2021   Allergies:  Lisinopril  Current Home Medications:   No current facility-administered medications for this encounter.    amLODipine-olmesartan (AZOR) 10-40 MG tablet   aspirin 81 MG tablet   atorvastatin (LIPITOR) 80 MG tablet   carvedilol (COREG) 12.5 MG tablet   cholecalciferol (VITAMIN D) 1000 UNITS tablet   clopidogrel (PLAVIX) 75 MG tablet   nitroGLYCERIN (NITROSTAT) 0.4 MG SL tablet   sildenafil (VIAGRA) 50 MG tablet   History:   Past Medical History:  Diagnosis Date   (HFpEF) heart failure with preserved ejection fraction ([place])    a.) TTE 10/23/2020: EF 50-55%, mild LVH, mild LAE, mild-mod RAE, mild MR, G1DD.   Aneurysm of right iliac artery (HCC)    Atherosclerosis of aorta (HCC)    Bilateral carotid artery disease ([place])    a.) carotid doppler 11/08/2020: 1-39% BICA   Coronary artery disease    a.) LHC/PCI 01/11/2012: 80% pRCA, 80% mRCA, 30% RPDA, 40% pLCx, 20% OM1, 100% mLAD (coronary thrombectomy + PTCA + 3.5 x 18 mm Xience DES x1), 20% D1   Erectile dysfunction    a.) on PDE5i (sildenafil) PRN   Hypercalcemia  Hyperlipidemia    Hypertension    Left inguinal hernia    Long term current use of antithrombotics/antiplatelets    a.) DAPT (ASA + clopidogrel)   Metabolic syndrome    Microscopic hematuria    Osteoarthrosis    Peripheral vascular disease (HCC)    Prostate cancer (HCC)    RBBB (right bundle  branch block)    Renal cell carcinoma ([place])    a.) s/p partial RIGHT nephrectomy   Sleep apnea    a.) does not utilize nocturnal PAP therapy   ST elevation myocardial infarction (STEMI) of anterolateral wall ([place]) 01/11/2012   a.) PCI 100% mLAD --> coronary thrombectomy + PTCA + 3.5 x 18 mm Xience DES   Vitamin D deficiency    Vitiligo    Past Surgical History:  Procedure Laterality Date   Beam Radiation     Prostate using Photons   COLONOSCOPY WITH PROPOFOL N/A 09/18/2020   Procedure: COLONOSCOPY WITH PROPOFOL;  Surgeon: Roberto Manifold, MD;  Location: [facility] ENDOSCOPY;  Service: Endoscopy;  Laterality: N/A;   ILIAC VEIN ANGIOPLASTY / STENTING  2013   LEFT HEART CATH  01/11/2012   LOWER EXTREMITY ANGIOGRAPHY Left 10/18/2020   Procedure: LOWER EXTREMITY ANGIOGRAPHY;  Surgeon: Roberto Huxley, MD;  Location: [place] [facility];  Service: Cardiovascular;  Laterality: Left;   PARTIAL NEPHRECTOMY Right 02/04/2008   Family History  Problem Relation Age of Onset   Hypertension Mother    Lung cancer Father    Hypertension Father    Hyperlipidemia Father    Diabetes Sister        oldest sister   Hypertension Sister    Hypertension Sister    Social History   Tobacco Use   Smoking status: Former    Packs/day: 0.50    Years: 20.00    Total pack years: 10.00    Types: Cigarettes    Start date: 82    Quit date: 01/12/2012    Years since quitting: 10.1   Smokeless tobacco: Never  Vaping Use   Vaping Use: Never used  Substance Use Topics   Alcohol use: Yes    Alcohol/week: 2.0 standard drinks of alcohol    Types: 2 Shots of liquor per week    Comment: Minimal alcohol consumption   Drug use: No    Pertinent Clinical Results:  LABS:   No visits with results within 3 Day(s) from this visit.  Latest known visit with results is:  Hospital Outpatient Visit on 03/13/2022  Component Date Value Ref Range Status   WBC 03/13/2022 6.4  4.0 - 10.5 K/uL Final   RBC 03/13/2022  4.24  4.22 - 5.81 MIL/uL Final   Hemoglobin 03/13/2022 13.4  13.0 - 17.0 g/dL Final   HCT 03/13/2022 39.7  39.0 - 52.0 % Final   MCV 03/13/2022 93.6  80.0 - 100.0 fL Final   MCH 03/13/2022 31.6  26.0 - 34.0 pg Final   MCHC 03/13/2022 33.8  30.0 - 36.0 g/dL Final   RDW 03/13/2022 15.1  11.5 - 15.5 % Final   Platelets 03/13/2022 276  150 - 400 K/uL Final   nRBC 03/13/2022 0.0  0.0 - 0.2 % Final   Neutrophils Relative % 03/13/2022 48  % Final   Neutro Abs 03/13/2022 3.1  1.7 - 7.7 K/uL Final   Lymphocytes Relative 03/13/2022 42  % Final   Lymphs Abs 03/13/2022 2.6  0.7 - 4.0 K/uL Final   Monocytes Relative 03/13/2022 8  % Final  Monocytes Absolute 03/13/2022 0.5  0.1 - 1.0 K/uL Final   Eosinophils Relative 03/13/2022 2  % Final   Eosinophils Absolute 03/13/2022 0.1  0.0 - 0.5 K/uL Final   Basophils Relative 03/13/2022 0  % Final   Basophils Absolute 03/13/2022 0.0  0.0 - 0.1 K/uL Final   Immature Granulocytes 03/13/2022 0  % Final   Abs Immature Granulocytes 03/13/2022 0.01  0.00 - 0.07 K/uL Final   Performed at [facility], [place], [place] [address]   Sodium 03/13/2022 136  135 - 145 mmol/L Final   Potassium 03/13/2022 3.5  3.5 - 5.1 mmol/L Final   Chloride 03/13/2022 105  98 - 111 mmol/L Final   CO2 03/13/2022 23  22 - 32 mmol/L Final   Glucose, Bld 03/13/2022 116 (H)  70 - 99 mg/dL Final   Glucose reference range applies only to samples taken after fasting for at least 8 hours.   BUN 03/13/2022 16  8 - 23 mg/dL Final   Creatinine, Ser 03/13/2022 0.92  0.61 - 1.24 mg/dL Final   Calcium 03/13/2022 9.4  8.9 - 10.3 mg/dL Final   Total Protein 03/13/2022 7.4  6.5 - 8.1 g/dL Final   Albumin 03/13/2022 3.9  3.5 - 5.0 g/dL Final   AST 03/13/2022 23  15 - 41 U/L Final   ALT 03/13/2022 18  0 - 44 U/L Final   Alkaline Phosphatase 03/13/2022 58  38 - 126 U/L Final   Total Bilirubin 03/13/2022 0.7  0.3 - 1.2 mg/dL Final   GFR, Estimated 03/13/2022 >60  >60 mL/min  Final   Comment: (NOTE) Calculated using the CKD-EPI Creatinine Equation (2021)    Anion gap 03/13/2022 8  5 - 15 Final   Performed at [facility], [address] [place], [place] [address]    ECG: Date: 03/13/2022 Time ECG obtained: 1104 AM Rate: 48 bpm Rhythm: Sinus bradycardia with sinus arrhythmia; RBBB Axis (leads I and aVF): Normal Intervals: PR 142 ms. QRS 140 ms. QTc 398 ms. ST segment and T wave changes: No evidence of acute ST segment elevation or depression Comparison: Similar to previous tracing obtained on 07/04/2021   IMAGING / PROCEDURES: VAS [place] ABI WITH/WO TBI performed on 08/09/2021 Bilateral ankle-brachial indexes are within normal range.  No evidence of significant lower extremity arterial disease.  Bilateral toe-brachial indexes are within normal range.   VAS US CAROTID performed on 11/08/2020 Velocities in the right ICA are consistent with a 1-39% stenosis. The ECA appears <50% stenosed. Minimal heterogeneous plaque in the bulb. 3 right  Velocities in the left ICA are consistent with a 1-39% stenosis. The ECA appears <50% stenosed. No evidence of steal. Minimal heterogeneous plaque in the bulb. Bilateral vertebral arteries demonstrate antegrade flow.  Normal flow hemodynamics were seen in bilateral subclavian arteries.   TRANSTHORACIC ECHOCARDIOGRAM performed on 10/23/2020 Left ventricular ejection fraction, by estimation, is 50 to 55%. The left ventricle has low normal function. The left ventricle demonstrates regional wall motion abnormalities. There is mild left ventricular hypertrophy. Left ventricular diastolic parameters are consistent with Grade I diastolic dysfunction (impaired relaxation).  Right ventricular systolic function is normal. The right ventricular size is normal.  Left atrial size was mildly dilated.  Right atrial size was mild to moderately dilated.  The mitral valve is grossly normal. Mild mitral valve regurgitation. No  evidence of mitral stenosis.  The aortic valve is normal in structure. Aortic valve regurgitation is not visualized. No aortic stenosis is  present.   Impression and Plan:  Roberto [name] has been referred for pre-anesthesia review and clearance prior to him undergoing the planned anesthetic and procedural courses. Available labs, pertinent testing, and imaging results were personally reviewed by me in preparation for upcoming operative/procedural course. [facility] medical record has been updated following extensive record review and patient interview with PAT staff.   This patient has been appropriately cleared by cardiology with an overall ACCEPTABLE risk of significant perioperative cardiovascular complications. Based on clinical review performed today (03/19/22), barring any significant acute changes in the patient's overall condition, it is anticipated that he will be able to proceed with the planned surgical intervention. Any acute changes in clinical condition may necessitate his procedure being postponed and/or cancelled. Patient will meet with anesthesia team (MD and/or CRNA) on the day of his procedure for preoperative evaluation/assessment. Questions regarding anesthetic course will be fielded at that time.   Pre-surgical instructions were reviewed with the patient during his PAT appointment, and questions were fielded to satisfaction by PAT clinical staff. He has been instructed on which medications that he will need to hold prior to surgery, as well as the ones that have been deemed safe/appropriate to take of the day of his procedure. As part of the general education provided by PAT, patient made aware both verbally and in writing, that he would need to abstain from the use of any illegal substances during his perioperative course.  He was advised that failure to follow the provided instructions could necessitate case cancellation or result serious perioperative complications up to and  including death. Patient encouraged to contact PAT and/or his surgeon's office to discuss any questions or concerns that may arise prior to surgery; verbalized understanding.   Honor Loh, MSN, APRN, FNP-C, CEN [facility]  Peri-operative Services Nurse Practitioner Phone: [phone] Fax: [phone] 03/19/22 9:38 AM  NOTE: This note has been prepared using Dragon dictation software. Despite my best ability to proofread, there is always the potential that unintentional transcriptional errors may still occur from this process.

## 2022-03-20 MED ORDER — ORAL CARE MOUTH RINSE
15.0000 mL | Freq: Once | OROMUCOSAL | Status: AC
  Administered 2022-03-21: 15 mL via OROMUCOSAL

## 2022-03-20 MED ORDER — LACTATED RINGERS IV SOLN: INTRAVENOUS | Status: DC

## 2022-03-20 MED ORDER — FAMOTIDINE 20 MG PO TABS
20.0000 mg | ORAL_TABLET | Freq: Once | ORAL | Status: AC
  Administered 2022-03-21: 20 mg via ORAL

## 2022-03-20 MED ORDER — CEFAZOLIN SODIUM-DEXTROSE 2-4 GM/100ML-% IV SOLN
2.0000 g | INTRAVENOUS | Status: AC
  Administered 2022-03-21: 2 g via INTRAVENOUS

## 2022-03-20 MED ORDER — CELECOXIB 200 MG PO CAPS
200.0000 mg | ORAL_CAPSULE | ORAL | Status: AC
  Administered 2022-03-21: 200 mg via ORAL

## 2022-03-20 MED ORDER — CHLORHEXIDINE GLUCONATE CLOTH 2 % EX PADS: 6.0000 | MEDICATED_PAD | Freq: Once | CUTANEOUS | Status: DC

## 2022-03-20 MED ORDER — BUPIVACAINE LIPOSOME 1.3 % IJ SUSP: 20.0000 mL | Freq: Once | INTRAMUSCULAR | Status: DC

## 2022-03-20 MED ORDER — CHLORHEXIDINE GLUCONATE 0.12 % MT SOLN: 15.0000 mL | Freq: Once | OROMUCOSAL | Status: AC

## 2022-03-20 MED ORDER — GABAPENTIN 300 MG PO CAPS
300.0000 mg | ORAL_CAPSULE | ORAL | Status: AC
  Administered 2022-03-21: 300 mg via ORAL

## 2022-03-20 MED ORDER — ACETAMINOPHEN 500 MG PO TABS
1000.0000 mg | ORAL_TABLET | ORAL | Status: AC
  Administered 2022-03-21: 1000 mg via ORAL

## 2022-03-21 ENCOUNTER — Other Ambulatory Visit: Payer: Self-pay

## 2022-03-21 ENCOUNTER — Ambulatory Visit: Payer: Federal, State, Local not specified - PPO | Admitting: Urgent Care

## 2022-03-21 ENCOUNTER — Encounter: Payer: Self-pay | Admitting: Surgery

## 2022-03-21 ENCOUNTER — Encounter
Admission: RE | Disposition: A | Discharge status: Home / Self Care | Payer: Self-pay | Source: Home / Self Care | Attending: Surgery

## 2022-03-21 ENCOUNTER — Ambulatory Visit
Admission: RE | Admit: 2022-03-21 | Discharge: 2022-03-21 | Disposition: A | Discharge status: Home / Self Care | Payer: Federal, State, Local not specified - PPO | Attending: Surgery | Admitting: Surgery

## 2022-03-21 DIAGNOSIS — I2510 Atherosclerotic heart disease of native coronary artery without angina pectoris: Secondary | ICD-10-CM | POA: Diagnosis not present

## 2022-03-21 DIAGNOSIS — Z955 Presence of coronary angioplasty implant and graft: Secondary | ICD-10-CM | POA: Diagnosis not present

## 2022-03-21 DIAGNOSIS — Z87891 Personal history of nicotine dependence: Secondary | ICD-10-CM | POA: Diagnosis not present

## 2022-03-21 DIAGNOSIS — Z79899 Other long term (current) drug therapy: Secondary | ICD-10-CM | POA: Insufficient documentation

## 2022-03-21 DIAGNOSIS — Z85528 Personal history of other malignant neoplasm of kidney: Secondary | ICD-10-CM | POA: Diagnosis not present

## 2022-03-21 DIAGNOSIS — I70219 Atherosclerosis of native arteries of extremities with intermittent claudication, unspecified extremity: Secondary | ICD-10-CM | POA: Diagnosis not present

## 2022-03-21 DIAGNOSIS — I10 Essential (primary) hypertension: Secondary | ICD-10-CM | POA: Insufficient documentation

## 2022-03-21 DIAGNOSIS — Z905 Acquired absence of kidney: Secondary | ICD-10-CM | POA: Insufficient documentation

## 2022-03-21 DIAGNOSIS — K4090 Unilateral inguinal hernia, without obstruction or gangrene, not specified as recurrent: Secondary | ICD-10-CM | POA: Diagnosis not present

## 2022-03-21 DIAGNOSIS — G4733 Obstructive sleep apnea (adult) (pediatric): Secondary | ICD-10-CM | POA: Diagnosis not present

## 2022-03-21 HISTORY — DX: Unspecified right bundle-branch block: I45.10

## 2022-03-21 HISTORY — DX: Disorder of arteries and arterioles, unspecified: I77.9

## 2022-03-21 HISTORY — DX: Male erectile dysfunction, unspecified: N52.9

## 2022-03-21 HISTORY — DX: Long term (current) use of antithrombotics/antiplatelets: Z79.02

## 2022-03-21 HISTORY — DX: Unspecified diastolic (congestive) heart failure: I50.30

## 2022-03-21 SURGERY — HERNIORRHAPHY, INGUINAL, ROBOT-ASSISTED, LAPAROSCOPIC
Anesthesia: General | Laterality: Left

## 2022-03-21 MED ORDER — DEXAMETHASONE SODIUM PHOSPHATE 10 MG/ML IJ SOLN
INTRAMUSCULAR | Status: DC | PRN
  Administered 2022-03-21: 10 mg via INTRAVENOUS

## 2022-03-21 MED ORDER — MIDAZOLAM HCL 2 MG/2ML IJ SOLN
INTRAMUSCULAR | Status: AC
  Filled 2022-03-21: qty 2

## 2022-03-21 MED ORDER — MIDAZOLAM HCL 2 MG/2ML IJ SOLN
INTRAMUSCULAR | Status: DC | PRN
  Administered 2022-03-21: 2 mg via INTRAVENOUS

## 2022-03-21 MED ORDER — PROPOFOL 10 MG/ML IV BOLUS
INTRAVENOUS | Status: DC | PRN
  Administered 2022-03-21: 100 mg via INTRAVENOUS
  Administered 2022-03-21: 50 mg via INTRAVENOUS

## 2022-03-21 MED ORDER — FAMOTIDINE 20 MG PO TABS
ORAL_TABLET | ORAL | Status: AC
  Filled 2022-03-21: qty 1

## 2022-03-21 MED ORDER — BUPIVACAINE-EPINEPHRINE 0.25% -1:200000 IJ SOLN
INTRAMUSCULAR | Status: DC | PRN
  Administered 2022-03-21: 50 mL via INTRAMUSCULAR

## 2022-03-21 MED ORDER — EPINEPHRINE PF 1 MG/ML IJ SOLN
INTRAMUSCULAR | Status: AC
  Filled 2022-03-21: qty 1

## 2022-03-21 MED ORDER — LIDOCAINE HCL (CARDIAC) PF 100 MG/5ML IV SOSY
PREFILLED_SYRINGE | INTRAVENOUS | Status: DC | PRN
  Administered 2022-03-21: 100 mg via INTRAVENOUS

## 2022-03-21 MED ORDER — FENTANYL CITRATE (PF) 100 MCG/2ML IJ SOLN: 25.0000 ug | INTRAMUSCULAR | Status: DC | PRN

## 2022-03-21 MED ORDER — BUPIVACAINE LIPOSOME 1.3 % IJ SUSP
INTRAMUSCULAR | Status: AC
  Filled 2022-03-21: qty 20

## 2022-03-21 MED ORDER — 0.9 % SODIUM CHLORIDE (POUR BTL) OPTIME
TOPICAL | Status: DC | PRN
  Administered 2022-03-21: 500 mL

## 2022-03-21 MED ORDER — SUGAMMADEX SODIUM 200 MG/2ML IV SOLN
INTRAVENOUS | Status: DC | PRN
  Administered 2022-03-21: 200 mg via INTRAVENOUS

## 2022-03-21 MED ORDER — BUPIVACAINE HCL (PF) 0.25 % IJ SOLN
INTRAMUSCULAR | Status: AC
  Filled 2022-03-21: qty 30

## 2022-03-21 MED ORDER — GLYCOPYRROLATE 0.2 MG/ML IJ SOLN
INTRAMUSCULAR | Status: DC | PRN
  Administered 2022-03-21: .2 mg via INTRAVENOUS

## 2022-03-21 MED ORDER — CEFAZOLIN SODIUM-DEXTROSE 2-4 GM/100ML-% IV SOLN
INTRAVENOUS | Status: AC
  Filled 2022-03-21: qty 100

## 2022-03-21 MED ORDER — ACETAMINOPHEN 10 MG/ML IV SOLN: 1000.0000 mg | Freq: Once | INTRAVENOUS | Status: DC | PRN

## 2022-03-21 MED ORDER — PROMETHAZINE HCL 25 MG/ML IJ SOLN: 6.2500 mg | INTRAMUSCULAR | Status: DC | PRN

## 2022-03-21 MED ORDER — OXYCODONE HCL 5 MG PO TABS: 5.0000 mg | ORAL_TABLET | Freq: Once | ORAL | Status: DC | PRN

## 2022-03-21 MED ORDER — FENTANYL CITRATE (PF) 100 MCG/2ML IJ SOLN
INTRAMUSCULAR | Status: DC | PRN
  Administered 2022-03-21 (×2): 50 ug via INTRAVENOUS

## 2022-03-21 MED ORDER — CHLORHEXIDINE GLUCONATE 0.12 % MT SOLN
OROMUCOSAL | Status: AC
  Filled 2022-03-21: qty 15

## 2022-03-21 MED ORDER — CELECOXIB 200 MG PO CAPS
ORAL_CAPSULE | ORAL | Status: AC
  Filled 2022-03-21: qty 1

## 2022-03-21 MED ORDER — ONDANSETRON HCL 4 MG/2ML IJ SOLN
INTRAMUSCULAR | Status: DC | PRN
  Administered 2022-03-21: 4 mg via INTRAVENOUS

## 2022-03-21 MED ORDER — DROPERIDOL 2.5 MG/ML IJ SOLN: 0.6250 mg | Freq: Once | INTRAMUSCULAR | Status: DC | PRN

## 2022-03-21 MED ORDER — FENTANYL CITRATE (PF) 100 MCG/2ML IJ SOLN
INTRAMUSCULAR | Status: AC
  Filled 2022-03-21: qty 2

## 2022-03-21 MED ORDER — ACETAMINOPHEN 500 MG PO TABS
ORAL_TABLET | ORAL | Status: AC
  Filled 2022-03-21: qty 2

## 2022-03-21 MED ORDER — GABAPENTIN 300 MG PO CAPS
ORAL_CAPSULE | ORAL | Status: AC
  Filled 2022-03-21: qty 1

## 2022-03-21 MED ORDER — ROCURONIUM BROMIDE 100 MG/10ML IV SOLN
INTRAVENOUS | Status: DC | PRN
  Administered 2022-03-21: 10 mg via INTRAVENOUS
  Administered 2022-03-21: 50 mg via INTRAVENOUS

## 2022-03-21 MED ORDER — PHENYLEPHRINE HCL-NACL 20-0.9 MG/250ML-% IV SOLN
INTRAVENOUS | Status: DC | PRN
  Administered 2022-03-21: 50 ug/min via INTRAVENOUS

## 2022-03-21 MED ORDER — OXYCODONE HCL 5 MG/5ML PO SOLN: 5.0000 mg | Freq: Once | ORAL | Status: DC | PRN

## 2022-03-21 MED ORDER — HYDROCODONE-ACETAMINOPHEN 5-325 MG PO TABS: 1.0000 | ORAL_TABLET | Freq: Four times a day (QID) | ORAL | 0 refills | Status: DC | PRN

## 2022-03-21 MED ORDER — KETOROLAC TROMETHAMINE 30 MG/ML IJ SOLN
INTRAMUSCULAR | Status: AC
  Filled 2022-03-21: qty 1

## 2022-03-21 SURGICAL SUPPLY — 50 items
ADH SKN CLS APL DERMABOND .7 (GAUZE/BANDAGES/DRESSINGS) ×1
BLADE CLIPPER SURG (BLADE) ×1 IMPLANT
COVER TIP SHEARS 8 DVNC (MISCELLANEOUS) ×1 IMPLANT
COVER TIP SHEARS 8MM DA VINCI (MISCELLANEOUS) ×1
COVER WAND RF STERILE (DRAPES) ×1 IMPLANT
DERMABOND ADVANCED .7 DNX12 (GAUZE/BANDAGES/DRESSINGS) ×1 IMPLANT
DRAPE ARM DVNC X/XI (DISPOSABLE) ×3 IMPLANT
DRAPE COLUMN DVNC XI (DISPOSABLE) ×1 IMPLANT
DRAPE DA VINCI XI ARM (DISPOSABLE) ×3
DRAPE DA VINCI XI COLUMN (DISPOSABLE) ×1
DRAPE UTILITY 15X26 TOWEL STRL (DRAPES) ×1 IMPLANT
ELECT REM PT RETURN 9FT ADLT (ELECTROSURGICAL) ×1
ELECTRODE REM PT RTRN 9FT ADLT (ELECTROSURGICAL) ×1 IMPLANT
GLOVE ORTHO TXT STRL SZ7.5 (GLOVE) ×3 IMPLANT
GOWN STRL REUS W/ TWL LRG LVL3 (GOWN DISPOSABLE) ×1 IMPLANT
GOWN STRL REUS W/ TWL XL LVL3 (GOWN DISPOSABLE) ×2 IMPLANT
GOWN STRL REUS W/TWL LRG LVL3 (GOWN DISPOSABLE) ×1
GOWN STRL REUS W/TWL XL LVL3 (GOWN DISPOSABLE) ×2
GRASPER SUT TROCAR 14GX15 (MISCELLANEOUS) IMPLANT
IRRIGATION STRYKERFLOW (MISCELLANEOUS) IMPLANT
IRRIGATOR STRYKERFLOW (MISCELLANEOUS)
IV NS 1000ML (IV SOLUTION)
IV NS 1000ML BAXH (IV SOLUTION) IMPLANT
KIT PINK PAD W/HEAD ARE REST (MISCELLANEOUS) ×1
KIT PINK PAD W/HEAD ARM REST (MISCELLANEOUS) ×1 IMPLANT
LABEL OR SOLS (LABEL) ×1 IMPLANT
MANIFOLD NEPTUNE II (INSTRUMENTS) ×1 IMPLANT
MESH 3DMAX LIGHT 4.8X6.7 LT XL (Mesh General) IMPLANT
NDL INSUFFLATION 14GA 120MM (NEEDLE) IMPLANT
NEEDLE HYPO 22GX1.5 SAFETY (NEEDLE) ×1 IMPLANT
NEEDLE INSUFFLATION 14GA 120MM (NEEDLE) IMPLANT
PACK LAP CHOLECYSTECTOMY (MISCELLANEOUS) ×1 IMPLANT
SEAL CANN UNIV 5-8 DVNC XI (MISCELLANEOUS) ×3 IMPLANT
SEAL XI 5MM-8MM UNIVERSAL (MISCELLANEOUS) ×3
SET TUBE SMOKE EVAC HIGH FLOW (TUBING) ×1 IMPLANT
SOL ELECTROSURG ANTI STICK (MISCELLANEOUS) ×1
SOLUTION ELECTROSURG ANTI STCK (MISCELLANEOUS) ×1 IMPLANT
SUT MNCRL 4-0 (SUTURE) ×1
SUT MNCRL 4-0 27XMFL (SUTURE) ×1
SUT V-LOC 90 ABS 3-0 VLT  V-20 (SUTURE)
SUT V-LOC 90 ABS 3-0 VLT V-20 (SUTURE) IMPLANT
SUT VIC AB 2-0 SH 27 (SUTURE) ×1
SUT VIC AB 2-0 SH 27XBRD (SUTURE) ×1 IMPLANT
SUT VIC AB 3-0 SH 27 (SUTURE)
SUT VIC AB 3-0 SH 27X BRD (SUTURE) IMPLANT
SUT VLOC 90 2/L VL 12 GS22 (SUTURE) IMPLANT
SUT VLOC 90 S/L VL9 GS22 (SUTURE) IMPLANT
SUTURE MNCRL 4-0 27XMF (SUTURE) ×1 IMPLANT
TRAP FLUID SMOKE EVACUATOR (MISCELLANEOUS) ×1 IMPLANT
WATER STERILE IRR 500ML POUR (IV SOLUTION) ×1 IMPLANT

## 2022-03-21 NOTE — Transfer of Care (Signed)
Immediate Anesthesia Transfer of Care Note  Patient: Roberto Walker  Procedure(s) Performed: XI ROBOTIC ASSISTED INGUINAL HERNIA (Left)  Patient Location: PACU  Anesthesia Type:General  Level of Consciousness: awake, alert , and oriented  Airway & Oxygen Therapy: Patient Spontanous Breathing  Post-op Assessment: Report given to RN and Post -op Vital signs reviewed and stable  Post vital signs: Reviewed and stable  Last Vitals:  Vitals Value Taken Time  BP 127/79 03/21/22 1215  Temp    Pulse 54 03/21/22 1216  Resp 17 03/21/22 1216  SpO2 97 % 03/21/22 1216  Vitals shown include unvalidated device data.  Last Pain:  Vitals:   03/21/22 0943  TempSrc: Temporal  PainSc: 0-No pain      Patients Stated Pain Goal: 0 ([id number])  Complications: No notable events documented.

## 2022-03-21 NOTE — Anesthesia Procedure Notes (Signed)
Procedure Name: Intubation Date/Time: 03/21/2022 11:14 AM  Performed by: Patience Musca., CRNAPre-anesthesia Checklist: Patient identified, Patient being monitored, Timeout performed, Emergency Drugs available and Suction available Patient Re-evaluated:Patient Re-evaluated prior to induction Oxygen Delivery Method: Circle system utilized Preoxygenation: Pre-oxygenation with 100% oxygen Induction Type: IV induction Ventilation: Mask ventilation without difficulty Laryngoscope Size: McGraph and 4 Grade View: Grade I Tube type: Oral Tube size: 7.5 mm Number of attempts: 1 Placement Confirmation: ETT inserted through vocal cords under direct vision, positive ETCO2 and breath sounds checked- equal and bilateral Secured at: 23 cm Tube secured with: Tape Dental Injury: Teeth and Oropharynx as per pre-operative assessment

## 2022-03-21 NOTE — Anesthesia Postprocedure Evaluation (Signed)
Anesthesia Post Note  Patient: Roberto Walker  Procedure(s) Performed: XI ROBOTIC ASSISTED INGUINAL HERNIA (Left)  Patient location during evaluation: PACU Anesthesia Type: General Level of consciousness: awake and alert Pain management: pain level controlled Vital Signs Assessment: post-procedure vital signs reviewed and stable Respiratory status: spontaneous breathing, nonlabored ventilation and respiratory function stable Cardiovascular status: blood pressure returned to baseline and stable Postop Assessment: no apparent nausea or vomiting Anesthetic complications: no   There were no known notable events for this encounter.   Last Vitals:  Vitals:   03/21/22 1250 03/21/22 1259  BP: 118/75 114/77  Pulse: (!) 56 (!) 48  Resp: 15 14  Temp:  36.9 C  SpO2: 97% 100%    Last Pain:  Vitals:   03/21/22 1259  TempSrc: Oral  PainSc: 0-No pain                 [place] Ouch

## 2022-03-21 NOTE — Discharge Instructions (Signed)
AMBULATORY SURGERY  DISCHARGE INSTRUCTIONS   The drugs that you were given will stay in your system until tomorrow so for the next 24 hours you should not:  Drive an automobile Make any legal decisions Drink any alcoholic beverage   You may resume regular meals tomorrow.  Today it is better to start with liquids and gradually work up to solid foods.  You may eat anything you prefer, but it is better to start with liquids, then soup and crackers, and gradually work up to solid foods.   Please notify your doctor immediately if you have any unusual bleeding, trouble breathing, redness and pain at the surgery site, drainage, fever, or pain not relieved by medication.   Please contact your physician with any problems or Same Day Surgery at [phone], Monday through Friday 6 am to 4 pm, or [place] at [facility] number at [phone].  Information for Discharge Teaching: EXPAREL (bupivacaine liposome injectable suspension) DO NOT REMOVE TEAL EXPAREL BRACELET FOR 4 DAYS (96 HOURS) 03/25/2022  Your surgeon or anesthesiologist gave you EXPAREL(bupivacaine) to help control your pain after surgery.  EXPAREL is a local anesthetic that provides pain relief by numbing the tissue around the surgical site. EXPAREL is designed to release pain medication over time and can control pain for up to 72 hours. Depending on how you respond to EXPAREL, you may require less pain medication during your recovery.  Possible side effects: Temporary loss of sensation or ability to move in the area where bupivacaine was injected. Nausea, vomiting, constipation Rarely, numbness and tingling in your mouth or lips, lightheadedness, or anxiety may occur. Call your doctor right away if you think you may be experiencing any of these sensations, or if you have other questions regarding possible side effects.  Follow all other discharge instructions given to you by your surgeon or nurse. Eat a healthy diet and  drink plenty of water or other fluids.  If you return to the hospital for any reason within 96 hours following the administration of EXPAREL, it is important for health care providers to know that you have received this anesthetic. A teal colored band has been placed on your arm with the date, time and amount of EXPAREL you have received in order to alert and inform your health care providers. Please leave this armband in place for the full 96 hours following administration, and then you may remove the band.

## 2022-03-21 NOTE — Anesthesia Preprocedure Evaluation (Addendum)
Anesthesia Evaluation  Patient identified by MRN, date of birth, ID band Patient awake    Reviewed: Allergy & Precautions, NPO status , Patient's Chart, lab work & pertinent test results, reviewed documented beta blocker date and time   Airway Mallampati: III       Dental no notable dental hx.    Pulmonary neg pulmonary ROS, former smoker   Pulmonary exam normal        Cardiovascular Exercise Tolerance: Good hypertension, Pt. on medications and Pt. on home beta blockers + CAD, + Cardiac Stents and + Peripheral Vascular Disease  Normal cardiovascular exam+ dysrhythmias (RBBB)   TTE 10/23/2020: EF 50-55%, mild LVH, mild LAE, mild-mod RAE, mild MR, G1DD.    Neuro/Psych negative neurological ROS  negative psych ROS   GI/Hepatic negative GI ROS, Neg liver ROS,,,  Endo/Other  negative endocrine ROS    Renal/GU Renal disease (history of Renal Cancer s/p nephrectomy 2010)s/p partial RIGHT nephrectomy     Musculoskeletal  (+) Arthritis , Osteoarthritis,    Abdominal Normal abdominal exam  (+) - obese  Peds  Hematology negative hematology ROS (+)   Anesthesia Other Findings   Reproductive/Obstetrics                             Anesthesia Physical Anesthesia Plan  ASA: 3  Anesthesia Plan: General   Post-op Pain Management: Tylenol PO (pre-op)*, Celebrex PO (pre-op)* and Gabapentin PO (pre-op)*   Induction: Intravenous  PONV Risk Score and Plan: 2 and Ondansetron, Dexamethasone and Midazolam  Airway Management Planned: Oral ETT  Additional Equipment:   Intra-op Plan:   Post-operative Plan: Extubation in OR  Informed Consent: I have reviewed the patients History and Physical, chart, labs and discussed the procedure including the risks, benefits and alternatives for the proposed anesthesia with the patient or authorized representative who has indicated his/her understanding and acceptance.      Dental advisory given  Plan Discussed with: CRNA and Anesthesiologist  Anesthesia Plan Comments:         Anesthesia Quick Evaluation

## 2022-03-21 NOTE — Op Note (Signed)
Robotic assisted Laparoscopic Transabdominal Left Inguinal Hernia Repair with Mesh       Pre-operative Diagnosis:  Left  Inguinal Hernia   Post-operative Diagnosis: Same   Procedure: Robotic assisted Laparoscopic  repair of left inguinal hernia(s)   Surgeon: [name], M.D., FACS   Anesthesia: GETA   Findings: Left direct  inguinal hernia, no  evidence of right sided hernia.         Procedure Details  The patient was seen again in the Holding Room. The benefits, complications, treatment options, and expected outcomes were discussed with the patient. The risks of bleeding, infection, recurrence of symptoms, failure to resolve symptoms, recurrence of hernia, ischemic orchitis, chronic pain syndrome or neuroma, were reviewed again. The likelihood of improving the patient's symptoms with return to their baseline status is good.  The patient and/or family concurred with the proposed plan, giving informed consent.  The patient was taken to Operating Room, identified  and the procedure verified as Laparoscopic Inguinal Hernia Repair. Laterality confirmed.  A Time Out was held and the above information confirmed.   Prior to the induction of general anesthesia, antibiotic prophylaxis was administered. VTE prophylaxis was in place. General endotracheal anesthesia was then administered and tolerated well. After the induction, the abdomen was prepped with Chloraprep and draped in the sterile fashion. The patient was positioned in the supine position.   After local infiltration of quarter percent Marcaine with epinephrine mixed with Exparel, stab incision was made left upper quadrant.  On the left at Palmer's point, the Veress needle is passed with sensation of the layers to penetrate the abdominal wall and into the peritoneum.  Saline drop test is confirmed peritoneal placement.  Insufflation is initiated with carbon dioxide to pressures of 15 mmHg. An 8.5 mm port is placed to the left off of the  midline, with blunt tipped trocar.  Pneumoperitoneum maintained w/o HD changes using the AirSeal to pressures of 15 mm Hg with CO2. No evidence of bowel injuries.  Two 8.5 mm ports placed under direct vision in each upper quadrant. The laparoscopy revealed left direct defect(s).   The robot was brought ot the table and docked in the standard fashion, no collision between arms was observed. Instruments were kept under direct view at all times. For left inguinal hernia repair,  I developed a peritoneal flap. The sac(s) were reduced and dissected free from adjacent structures. We preserved the vas and the vessels, and visualized them to their convergence and beyond in the retroperitoneum. Once dissection was completed an extra-large left sided BARD 3D Light mesh was placed and secured at three points with interrupted 2-0 Vicryl to the pubic tubercle and anteriorly. There was good coverage of the direct, indirect and femoral spaces.  Second look revealed no complications or injuries. The flap was then closed with 3-0 V-lock suture.  Peritoneal closure without defects.  Once assuring that hemostasis was adequate, all needles/sponges removed, and the robot was undocked.  Under direct visualization I placed the Veress needle into the preperitoneal space the Veress' valve was released allowing extraperitoneal CO2 to escape, it was also used to access the space for supplemental local anesthesia. The ports were removed, the abdomen desulflated.  4-0 subcuticular Monocryl was used at all skin edges. Dermabond was placed.  Patient tolerated the procedure well. There were no complications. He was taken to the recovery room in stable condition.           [name], M.D., FACS 03/21/2022, 12:10 PM

## 2022-03-21 NOTE — Interval H&P Note (Signed)
History and Physical Interval Note:  03/21/2022 10:24 AM  Roberto Walker  has presented today for surgery, with the diagnosis of inguinal hernia.  The various methods of treatment have been discussed with the patient and family. After consideration of risks, benefits and other options for treatment, the patient has consented to  Procedure(s): XI ROBOTIC [place] (Left) as a surgical intervention.  The patient's history has been reviewed, patient examined, no change in status, stable for surgery.  I have reviewed the patient's chart and labs.  Questions were answered to the patient's satisfaction.   The left side is marked.   [name]

## 2022-03-26 NOTE — Progress Notes (Unsigned)
Name: Roberto Walker   MRN: [id number]    DOB: [birth year]   Date:03/27/2022       Progress Note  Subjective  Chief Complaint  Follow Up  HPI  HTN: bp has been well controlled .Marland Kitchen He denies side effects of medications, denies decrease in exercise tolerance , chest pain, palpitation or dizziness.    History of CAD: cad S/P DES to LAD 01/11/2012, he also has 80% RCA disease who presents for f/u, doing well last EF 50 % in 2019 Taking statin therapy, aspirin and plavix daily  He has a follow up with cardiologist in June. He continues to work out, walks 1.5 miles on his treadmill for about 20 minutes   Hyperlipidemia: taking Atorvastatin that he gets through the [place], and is compliant , denies myalgia. Labs were  reviewed and LDL was 47 . Continue current regiment    OSA: he stopped wearing his CPAP since he retired in Feb 2022  No headaches in the mornings.He states he could not tolerate it, wife states he is no longer snoring . Unchanged    History of kidney and prostate cancer : he is going to [facility] to check on prostate once every 12  months. S/p radiation . He was seeing Urologist at [facility] but is now going to [place], last PSA June 2022 was normal, because of some hematuria he had repeat CT in 09/2019 showed some cysts, advised to have cystoscopy but he declined the procedure. He denies any hematuria , no flank pain, fever  . He does not follow up with Urologist  We ill recheck ua and PSA during his CPE   Aneurysm of right common iliac : incidental finding on CT done for hematuria , discussed need for yearly follow up with Dr. Lucky Cowboy yearly. He is on statin therapy and aspirin    PVD: sees Dr. Lucky Cowboy once a year,he developed claudication Summer 2022 had a stent placed and is doing well since Still on Plavix and aspirin and statin therapy . Discussed increasing the distance on treadmill    Pre-diabetes: he has been following life style modification such as exercise and healthy diet. He denies  polyphagia, polydipsia or polyuria. He is eats a balanced diet, grains, fish, chicken, vegetables. Last A1C was up again from 5.6 % to 5.8 % and down to 5.4 %    ED: he has difficulty initiating and maintaining erection since prostate radiation therapy, he is now on Viagra, getting it through the [place] , stable.   Patient Active Problem List   Diagnosis Date Noted   Left inguinal hernia 11/22/2021   Aneurysm of right iliac artery ([place]) 06/26/2021   Atherosclerosis of native arteries of extremity with intermittent claudication ([place]) 02/08/2021   History of colonic polyps    Polyp of colon    Atherosclerosis of aorta ([place]) 07/31/2020   Personal history of kidney cancer 02/06/2015   Benign essential HTN 08/02/2014   Coronary artery disease involving native coronary artery of native heart without angina pectoris 08/02/2014   Dyslipidemia 08/02/2014   H/O acute myocardial infarction 08/02/2014   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   Impotence of organic origin [id number]   Dysmetabolic syndrome [id number]   Arthritis, degenerative 08/02/2014   Peripheral vascular disease ([place]) 08/02/2014   Blood glucose elevated 08/02/2014   OSA (obstructive sleep apnea) 08/02/2014   Vitamin D deficiency 08/02/2014   Vitiligo 08/02/2014    Past Surgical History:  Procedure Laterality Date   Beam Radiation  Prostate using Photons   COLONOSCOPY WITH PROPOFOL N/A 09/18/2020   Procedure: COLONOSCOPY WITH PROPOFOL;  Surgeon: Virgel Manifold, MD;  Location: [facility] ENDOSCOPY;  Service: Endoscopy;  Laterality: N/A;   ILIAC VEIN ANGIOPLASTY / STENTING  2013   LEFT HEART CATH  01/11/2012   LOWER EXTREMITY ANGIOGRAPHY Left 10/18/2020   Procedure: LOWER EXTREMITY ANGIOGRAPHY;  Surgeon: Algernon Huxley, MD;  Location: [facility];  Service: Cardiovascular;  Laterality: Left;   PARTIAL NEPHRECTOMY Right 02/04/2008    Family History  Problem Relation Age of Onset   Hypertension Mother    Lung cancer Father     Hypertension Father    Hyperlipidemia Father    Diabetes Sister        oldest sister   Hypertension Sister    Hypertension Sister     Social History   Tobacco Use   Smoking status: Former    Packs/day: 0.50    Years: 20.00    Total pack years: 10.00    Types: Cigarettes    Start date: 34    Quit date: 01/12/2012    Years since quitting: 10.2   Smokeless tobacco: Never  Substance Use Topics   Alcohol use: Yes    Alcohol/week: 2.0 standard drinks of alcohol    Types: 2 Shots of liquor per week    Comment: Minimal alcohol consumption     Current Outpatient Medications:    amLODipine-olmesartan (AZOR) 10-40 MG tablet, TAKE 1 TABLET BY MOUTH DAILY (Patient taking differently: Take 1 tablet by mouth every morning.), Disp: 90 tablet, Rfl: 0   aspirin 81 MG tablet, Take 1 tablet by mouth daily., Disp: , Rfl:    atorvastatin (LIPITOR) 80 MG tablet, Take 1 tablet (80 mg total) by mouth daily. (Patient taking differently: Take 80 mg by mouth at bedtime.), Disp: 90 tablet, Rfl: 1   carvedilol (COREG) 12.5 MG tablet, Take 1 tablet (12.5 mg total) by mouth 2 (two) times daily., Disp: 180 tablet, Rfl: 1   cholecalciferol (VITAMIN D) 1000 UNITS tablet, Take 1 tablet by mouth daily., Disp: , Rfl:    clopidogrel (PLAVIX) 75 MG tablet, Take 1 tablet (75 mg total) by mouth daily., Disp: 90 tablet, Rfl: 1   nitroGLYCERIN (NITROSTAT) 0.4 MG SL tablet, Place 1 tablet (0.4 mg total) under the tongue daily., Disp: 30 tablet, Rfl: 0   sildenafil (VIAGRA) 50 MG tablet, Take 50 mg by mouth as needed., Disp: , Rfl:   Allergies  Allergen Reactions   Lisinopril Swelling    Swelling around mouth, lips    I personally reviewed active problem list, medication list, allergies, family history, social history, health maintenance with the patient/caregiver today.   ROS  Constitutional: Negative for fever or weight change.  Respiratory: Negative for cough and shortness of breath.   Cardiovascular:  Negative for chest pain or palpitations.  Gastrointestinal: Negative for abdominal pain, no bowel changes.  Musculoskeletal: Negative for gait problem or joint swelling.  Skin: Negative for rash.  Neurological: Negative for dizziness or headache.  No other specific complaints in a complete review of systems (except as listed in HPI above).   Objective  Vitals:   03/27/22 0904  BP: 128/80  Pulse: 69  Resp: 16  Temp: 98.1 F (36.7 C)  TempSrc: Oral  SpO2: 98%  Weight: 183 lb (83 kg)  Height: 6' (1.829 m)    Body mass index is 24.82 kg/m.  Physical Exam  Constitutional: Patient appears well-developed and well-nourished.  No  distress.  HEENT: head atraumatic, normocephalic, pupils equal and reactive to light, neck supple Cardiovascular: Normal rate, regular rhythm and normal heart sounds.  No murmur heard. No BLE edema. Pulmonary/Chest: Effort normal and breath sounds normal. No respiratory distress. Abdominal: Soft.  There is no tenderness. Recent surgical scar is in good aspect  Psychiatric: Patient has a normal mood and affect. behavior is normal. Judgment and thought content normal.   Recent Results (from the past 2160 hour(s))  CBC with Differential/Platelet     Status: None   Collection Time: 03/13/22 11:05 AM  Result Value Ref Range   WBC 6.4 4.0 - 10.5 K/uL   RBC 4.24 4.22 - 5.81 MIL/uL   Hemoglobin 13.4 13.0 - 17.0 g/dL   HCT 39.7 39.0 - 52.0 %   MCV 93.6 80.0 - 100.0 fL   MCH 31.6 26.0 - 34.0 pg   MCHC 33.8 30.0 - 36.0 g/dL   RDW 15.1 11.5 - 15.5 %   Platelets 276 150 - 400 K/uL   nRBC 0.0 0.0 - 0.2 %   Neutrophils Relative % 48 %   Neutro Abs 3.1 1.7 - 7.7 K/uL   Lymphocytes Relative 42 %   Lymphs Abs 2.6 0.7 - 4.0 K/uL   Monocytes Relative 8 %   Monocytes Absolute 0.5 0.1 - 1.0 K/uL   Eosinophils Relative 2 %   Eosinophils Absolute 0.1 0.0 - 0.5 K/uL   Basophils Relative 0 %   Basophils Absolute 0.0 0.0 - 0.1 K/uL   Immature Granulocytes 0 %   Abs  Immature Granulocytes 0.01 0.00 - 0.07 K/uL    Comment: Performed at [facility], [address] [place], [place] [address]  Comprehensive metabolic panel     Status: Abnormal   Collection Time: 03/13/22 11:05 AM  Result Value Ref Range   Sodium 136 135 - 145 mmol/L   Potassium 3.5 3.5 - 5.1 mmol/L   Chloride 105 98 - 111 mmol/L   CO2 23 22 - 32 mmol/L   Glucose, Bld 116 (H) 70 - 99 mg/dL    Comment: Glucose reference range applies only to samples taken after fasting for at least 8 hours.   BUN 16 8 - 23 mg/dL   Creatinine, Ser 0.92 0.61 - 1.24 mg/dL   Calcium 9.4 8.9 - 10.3 mg/dL   Total Protein 7.4 6.5 - 8.1 g/dL   Albumin 3.9 3.5 - 5.0 g/dL   AST 23 15 - 41 U/L   ALT 18 0 - 44 U/L   Alkaline Phosphatase 58 38 - 126 U/L   Total Bilirubin 0.7 0.3 - 1.2 mg/dL   GFR, Estimated >60 >60 mL/min    Comment: (NOTE) Calculated using the CKD-EPI Creatinine Equation (2021)    Anion gap 8 5 - 15    Comment: Performed at [facility], [address] [place], [place] [address]    PHQ2/9:    03/27/2022    9:06 AM 02/25/2022    9:45 AM 11/22/2021    8:57 AM 06/26/2021    3:28 PM 01/11/2021   10:06 AM  Depression screen PHQ 2/9  Decreased Interest 0 0 0 0 0  Down, Depressed, Hopeless 0 0 0 0 0  PHQ - 2 Score 0 0 0 0 0  Altered sleeping 0 0 0  0  Tired, decreased energy 0 0 0  0  Change in appetite 0 0 0  0  Feeling bad or failure about yourself  0 0 0  0  Trouble concentrating 0 0 0  0  Moving slowly or fidgety/restless 0 0 0  0  Suicidal thoughts 0 0 0  0  PHQ-9 Score 0 0 0  0  Difficult doing work/chores     Not difficult at all    phq 9 is negative   Fall Risk:    03/27/2022    9:05 AM 02/25/2022    9:45 AM 11/22/2021    8:56 AM 06/26/2021    3:28 PM 01/11/2021   10:06 AM  Fall Risk   Falls in the past year? 0 0 0 0   Number falls in past yr:   0 0 0  Injury with Fall?   0 0 0  Risk for fall due to : No Fall Risks No Fall Risks No Fall Risks No Fall  Risks No Fall Risks  Follow up Falls prevention discussed Falls prevention discussed Falls prevention discussed Falls prevention discussed Falls prevention discussed      Functional Status Survey: Is the patient deaf or have difficulty hearing?: No Does the patient have difficulty seeing, even when wearing glasses/contacts?: No Does the patient have difficulty concentrating, remembering, or making decisions?: No Does the patient have difficulty walking or climbing stairs?: No Does the patient have difficulty dressing or bathing?: No Does the patient have difficulty doing errands alone such as visiting a doctor's office or shopping?: No    Assessment & Plan  1. Atherosclerosis of aorta (HCC)  On statin therapy and also aspirin  2. Coronary artery disease involving native coronary artery of native heart without angina pectoris  Asymptomatic since stent placement continue medications, his heart rate goes to mid 40's when at rest and explained likely due to beta blocker and since asymptomatic he can wait until he sees cardiologist   3. H/O acute myocardial infarction   4. Benign essential HTN  - amLODipine-olmesartan (AZOR) 10-40 MG tablet; Take 1 tablet by mouth daily.  Dispense: 90 tablet; Refill: 1  5. Dyslipidemia   6. History of prostate cancer   7. Vitamin D deficiency  We will recheck labs next visit   9. Dysmetabolic syndrome  Doing well with life style modification  10. ED (erectile dysfunction) of organic origin  Doing well on Viagra   11. Obstructive sleep apnea   Not using  CPAP  12. Personal history of kidney cancer   No longer seeing urologist, we will recheck urinalysis during his CPE

## 2022-03-27 ENCOUNTER — Ambulatory Visit (INDEPENDENT_AMBULATORY_CARE_PROVIDER_SITE_OTHER): Payer: Federal, State, Local not specified - PPO | Admitting: Family Medicine

## 2022-03-27 ENCOUNTER — Encounter: Payer: Self-pay | Admitting: Family Medicine

## 2022-03-27 VITALS — BP 128/80 | HR 69 | Temp 98.1°F | Resp 16 | Ht 72.0 in | Wt 183.0 lb

## 2022-03-27 DIAGNOSIS — I252 Old myocardial infarction: Secondary | ICD-10-CM | POA: Diagnosis not present

## 2022-03-27 DIAGNOSIS — I2510 Atherosclerotic heart disease of native coronary artery without angina pectoris: Secondary | ICD-10-CM | POA: Diagnosis not present

## 2022-03-27 DIAGNOSIS — I10 Essential (primary) hypertension: Secondary | ICD-10-CM

## 2022-03-27 DIAGNOSIS — N529 Male erectile dysfunction, unspecified: Secondary | ICD-10-CM

## 2022-03-27 DIAGNOSIS — I700 Atherosclerosis of aorta: Secondary | ICD-10-CM | POA: Diagnosis not present

## 2022-03-27 DIAGNOSIS — G4733 Obstructive sleep apnea (adult) (pediatric): Secondary | ICD-10-CM

## 2022-03-27 DIAGNOSIS — E559 Vitamin D deficiency, unspecified: Secondary | ICD-10-CM

## 2022-03-27 DIAGNOSIS — Z8546 Personal history of malignant neoplasm of prostate: Secondary | ICD-10-CM

## 2022-03-27 DIAGNOSIS — E8881 Metabolic syndrome: Secondary | ICD-10-CM

## 2022-03-27 DIAGNOSIS — E785 Hyperlipidemia, unspecified: Secondary | ICD-10-CM

## 2022-03-27 DIAGNOSIS — Z85528 Personal history of other malignant neoplasm of kidney: Secondary | ICD-10-CM

## 2022-03-27 MED ORDER — AMLODIPINE-OLMESARTAN 10-40 MG PO TABS: 1.0000 | ORAL_TABLET | Freq: Every day | ORAL | 1 refills | Status: DC

## 2022-04-03 ENCOUNTER — Ambulatory Visit (INDEPENDENT_AMBULATORY_CARE_PROVIDER_SITE_OTHER): Payer: Federal, State, Local not specified - PPO | Admitting: Physician Assistant

## 2022-04-03 ENCOUNTER — Encounter: Payer: Self-pay | Admitting: Physician Assistant

## 2022-04-03 ENCOUNTER — Other Ambulatory Visit: Payer: Self-pay

## 2022-04-03 VITALS — BP 127/82 | HR 53 | Temp 98.9°F | Ht 72.0 in | Wt 181.0 lb

## 2022-04-03 DIAGNOSIS — K4090 Unilateral inguinal hernia, without obstruction or gangrene, not specified as recurrent: Secondary | ICD-10-CM

## 2022-04-03 DIAGNOSIS — Z09 Encounter for follow-up examination after completed treatment for conditions other than malignant neoplasm: Secondary | ICD-10-CM

## 2022-04-03 NOTE — Progress Notes (Signed)
[facility] POST-OP OFFICE VISIT  04/03/2022  HPI: Roberto Walker is a 62 y.o. male 13 days s/p robotic assisted laparoscopic left inguinal hernia repair with Dr Christian Mate  He has done very well No issues with pain - did not need narcotic pain medications No fever, chills, nausea, emesis, or bowel changes No scrotal swelling or bruising Incisions well healed  Vital signs: BP 127/82   Pulse (!) 53   Temp 98.9 F (37.2 C) (Oral)   Ht 6' (1.829 m)   Wt 181 lb (82.1 kg)   SpO2 98%   BMI 24.55 kg/m    Physical Exam: Constitutional: Well appearing male, NAD Abdomen: Soft, non-tender, non-distended, no rebound/guarding Skin: Laparoscopic incisions are healing well, no erythema or drainage   Assessment/Plan: This is a 62 y.o. male 13 days s/p robotic assisted laparoscopic left inguinal hernia repair with Dr Christian Mate   - Pain control prn  - Reviewed wound care recommendation  - Reviewed lifting restrictions; 4-6 weeks total  - He can follow up on as needed basis; He understands to call with questions/concerns  -- Edison Simon, PA-C [facility] 04/03/2022, 2:46 PM M-F: 7am - 4pm

## 2022-04-03 NOTE — Patient Instructions (Signed)

## 2022-07-23 DIAGNOSIS — H902 Conductive hearing loss, unspecified: Secondary | ICD-10-CM | POA: Diagnosis not present

## 2022-07-23 DIAGNOSIS — H6123 Impacted cerumen, bilateral: Secondary | ICD-10-CM | POA: Diagnosis not present

## 2022-07-25 ENCOUNTER — Encounter: Payer: Self-pay | Admitting: Cardiology

## 2022-07-25 ENCOUNTER — Ambulatory Visit: Payer: Federal, State, Local not specified - PPO | Attending: Cardiology | Admitting: Cardiology

## 2022-07-25 VITALS — BP 124/84 | HR 50 | Ht 72.0 in | Wt 179.6 lb

## 2022-07-25 DIAGNOSIS — E7800 Pure hypercholesterolemia, unspecified: Secondary | ICD-10-CM | POA: Diagnosis not present

## 2022-07-25 DIAGNOSIS — I2510 Atherosclerotic heart disease of native coronary artery without angina pectoris: Secondary | ICD-10-CM | POA: Diagnosis not present

## 2022-07-25 DIAGNOSIS — I10 Essential (primary) hypertension: Secondary | ICD-10-CM | POA: Diagnosis not present

## 2022-07-25 MED ORDER — CARVEDILOL 6.25 MG PO TABS: 6.2500 mg | ORAL_TABLET | Freq: Two times a day (BID) | ORAL | 0 refills | Status: DC

## 2022-07-25 NOTE — Progress Notes (Signed)
Cardiology Office Note:    Date:  07/25/2022   ID:  Roberto Walker, DOB 11/25/1960, MRN [id number]  PCP:  Alba Cory, MD   [facility] Providers Cardiologist:  Debbe Odea, MD     Referring MD: Alba Cory, MD   Chief Complaint  Patient presents with   Follow-up    Patient denies new or acute cardiac problems/concerns today.  Heart rate on EKG 50 bpm.    History of Present Illness:    Roberto Walker is a 62 y.o. male with a hx of CAD/MI (s/p PCI to LAD 2013, RCA 80%)hypertension, hyperlipidemia, PAD (s/p PCI 2015, right common iliac artery aneurysm), former smoker x20+ years who presents for follow-up.   Feels well, denies chest pain or shortness of breath, heart rates at home usually 55-58.  Compliant with medications as prescribed.  Denies any new concerns.  Prior notes Patient previously followed at [facility] for cardiac care.  Has a history of MI, underwent drug-eluting stent to mid LAD 2013.   Patient has a history of hematuria, underwent an abdominal CT 09/26/2019 showing aortic atherosclerosis, finding of aneurysmal dilation of the right common iliac artery up to 1.7 cm noted.  He has a history of right iliac artery aneurysm, being followed by vascular surgery.  Past Medical History:  Diagnosis Date   (HFpEF) heart failure with preserved ejection fraction (HCC)    a.) TTE 10/23/2020: EF 50-55%, mild LVH, mild LAE, mild-mod RAE, mild MR, G1DD.   Aneurysm of right iliac artery (HCC)    Atherosclerosis of aorta (HCC)    Bilateral carotid artery disease (HCC)    a.) carotid doppler 11/08/2020: 1-39% BICA   Coronary artery disease    a.) LHC/PCI 01/11/2012: 80% pRCA, 80% mRCA, 30% RPDA, 40% pLCx, 20% OM1, 100% mLAD (coronary thrombectomy + PTCA + 3.5 x 18 mm Xience DES x1), 20% D1   Erectile dysfunction    a.) on PDE5i (sildenafil) PRN   Hypercalcemia    Hyperlipidemia    Hypertension    Left inguinal hernia    Long term current use of  antithrombotics/antiplatelets    a.) DAPT (ASA + clopidogrel)   Metabolic syndrome    Microscopic hematuria    Osteoarthrosis    Peripheral vascular disease (HCC)    Prostate cancer (HCC)    RBBB (right bundle branch block)    Renal cell carcinoma (HCC)    a.) s/p partial RIGHT nephrectomy   Sleep apnea    a.) does not utilize nocturnal PAP therapy   ST elevation myocardial infarction (STEMI) of anterolateral wall (HCC) 01/11/2012   a.) PCI 100% mLAD --> coronary thrombectomy + PTCA + 3.5 x 18 mm Xience DES   Vitamin D deficiency    Vitiligo     Past Surgical History:  Procedure Laterality Date   Beam Radiation     Prostate using Photons   COLONOSCOPY WITH PROPOFOL N/A 09/18/2020   Procedure: COLONOSCOPY WITH PROPOFOL;  Surgeon: Pasty Spillers, MD;  Location: [facility] ENDOSCOPY;  Service: Endoscopy;  Laterality: N/A;   ILIAC VEIN ANGIOPLASTY / STENTING  2013   LEFT HEART CATH  01/11/2012   LOWER EXTREMITY ANGIOGRAPHY Left 10/18/2020   Procedure: LOWER EXTREMITY ANGIOGRAPHY;  Surgeon: Annice Needy, MD;  Location: [facility] INVASIVE CV LAB;  Service: Cardiovascular;  Laterality: Left;   PARTIAL NEPHRECTOMY Right 02/04/2008    Current Medications: Current Meds  Medication Sig   amLODipine-olmesartan (AZOR) 10-40 MG tablet Take 1 tablet by mouth daily.  aspirin 81 MG tablet Take 1 tablet by mouth daily.   atorvastatin (LIPITOR) 80 MG tablet Take 1 tablet (80 mg total) by mouth daily. (Patient taking differently: Take 80 mg by mouth at bedtime.)   cholecalciferol (VITAMIN D) 1000 UNITS tablet Take 1 tablet by mouth daily.   clopidogrel (PLAVIX) 75 MG tablet Take 1 tablet (75 mg total) by mouth daily.   nitroGLYCERIN (NITROSTAT) 0.4 MG SL tablet Place 1 tablet (0.4 mg total) under the tongue daily.   sildenafil (VIAGRA) 50 MG tablet Take 50 mg by mouth as needed.   [DISCONTINUED] carvedilol (COREG) 12.5 MG tablet Take 1 tablet (12.5 mg total) by mouth 2 (two) times daily.      Allergies:   Lisinopril   Social History   Socioeconomic History   Marital status: Married    Spouse name: Vikki Ports   Number of children: 2   Years of education: College    Highest education level: Not on file  Occupational History   Occupation: RDU  Tobacco Use   Smoking status: Former    Packs/day: 0.50    Years: 20.00    Additional pack years: 0.00    Total pack years: 10.00    Types: Cigarettes    Start date: 64    Quit date: 01/12/2012    Years since quitting: 10.5   Smokeless tobacco: Never  Vaping Use   Vaping Use: Never used  Substance and Sexual Activity   Alcohol use: Yes    Alcohol/week: 2.0 standard drinks of alcohol    Types: 2 Shots of liquor per week    Comment: Minimal alcohol consumption   Drug use: No   Sexual activity: Yes    Partners: Female  Other Topics Concern   Not on file  Social History Narrative   Not on file   Social Determinants of Health   Financial Resource Strain: Low Risk  (11/22/2021)   Overall Financial Resource Strain (CARDIA)    Difficulty of Paying Living Expenses: Not hard at all  Food Insecurity: No Food Insecurity (11/22/2021)   Hunger Vital Sign    Worried About Running Out of Food in the Last Year: Never true    Ran Out of Food in the Last Year: Never true  Transportation Needs: No Transportation Needs (11/22/2021)   PRAPARE - Administrator, Civil Service (Medical): No    Lack of Transportation (Non-Medical): No  Physical Activity: Sufficiently Active (11/22/2021)   Exercise Vital Sign    Days of Exercise per Week: 5 days    Minutes of Exercise per Session: 60 min  Stress: No Stress Concern Present (11/22/2021)   [employer] of Occupational Health - Occupational Stress Questionnaire    Feeling of Stress : Only a little  Social Connections: Moderately Integrated (11/22/2021)   Social Connection and Isolation Panel [NHANES]    Frequency of Communication with Friends and Family: More than three  times a week    Frequency of Social Gatherings with Friends and Family: More than three times a week    Attends Religious Services: 1 to 4 times per year    Active Member of [employer] or Organizations: No    Attends Banker Meetings: Never    Marital Status: Married     Family History: The patient's family history includes Diabetes in his sister; Hyperlipidemia in his father; Hypertension in his father, mother, sister, and sister; Lung cancer in his father.  ROS:   Please see the history of  present illness.     All other systems reviewed and are negative.  EKGs/Labs/Other Studies Reviewed:    The following studies were reviewed today:   EKG:  EKG is  ordered today.  The ekg ordered today demonstrates sinus bradycardia, possible old septal infarct.  Recent Labs: 03/13/2022: ALT 18; BUN 16; Creatinine, Ser 0.92; Hemoglobin 13.4; Platelets 276; Potassium 3.5; Sodium 136  Recent Lipid Panel    Component Value Date/Time   CHOL 127 07/18/2021 0807   CHOL 134 02/16/2015 0820   CHOL 118 05/02/2011 0943   TRIG 58 07/18/2021 0807   TRIG 39 05/02/2011 0943   HDL 67 07/18/2021 0807   HDL 48 02/16/2015 0820   HDL 52 05/02/2011 0943   CHOLHDL 1.9 07/18/2021 0807   VLDL 9 06/06/2016 0804   VLDL 8 05/02/2011 0943   LDLCALC 47 07/18/2021 0807   LDLCALC 58 05/02/2011 0943     Risk Assessment/Calculations:          Physical Exam:    VS:  BP 124/84 (BP Location: Left Arm, Patient Position: Sitting, Cuff Size: Normal)   Pulse (!) 50   Ht 6' (1.829 m)   Wt 179 lb 9.6 oz (81.5 kg)   SpO2 98%   BMI 24.36 kg/m     Wt Readings from Last 3 Encounters:  07/25/22 179 lb 9.6 oz (81.5 kg)  04/03/22 181 lb (82.1 kg)  03/27/22 183 lb (83 kg)     GEN:  Well nourished, well developed in no acute distress HEENT: Normal NECK: No JVD; No carotid bruits CARDIAC: RRR, no murmurs, rubs, gallops RESPIRATORY:  Clear to auscultation without rales, wheezing or rhonchi  ABDOMEN: Soft,  non-tender, non-distended MUSCULOSKELETAL:  No edema; No deformity  SKIN: Warm and dry NEUROLOGIC:  Alert and oriented x 3 PSYCHIATRIC:  Normal affect   ASSESSMENT:    1. Coronary artery disease involving native coronary artery of native heart without angina pectoris   2. Primary hypertension   3. Pure hypercholesterolemia   4. Arteriosclerosis of coronary artery   5. Benign essential HTN     PLAN:    In order of problems listed above:  CAD/MI 2013 PCI to mid LAD.  80% mid RCA.  Denies chest pain.  Continue aspirin, Plavix, Lipitor.  Hypertension, BP controlled.  Bradycardic, heart rate 50.  Reduce Coreg to 6.25 mg twice daily.  Continue amlodipine, olmesartan. Hyperlipidemia, cholesterol controlled.  Continue Lipitor 80 mg daily.   Follow-up in 1 year.   Medication Adjustments/Labs and Tests Ordered: Current medicines are reviewed at length with the patient today.  Concerns regarding medicines are outlined above.  Orders Placed This Encounter  Procedures   EKG 12-Lead    Meds ordered this encounter  Medications   carvedilol (COREG) 6.25 MG tablet    Sig: Take 1 tablet (6.25 mg total) by mouth 2 (two) times daily.    Dispense:  180 tablet    Refill:  0     Patient Instructions  Medication Instructions:   DECREASE Carvedilol - Take one tablet (6.25mg ) by mouth twice a day.  If you have plenty of the 12.5mg  tablets you can cut those in half until you run out.   *If you need a refill on your cardiac medications before your next appointment, please call your pharmacy*   Lab Work:  None Ordered  If you have labs (blood work) drawn today and your tests are completely normal, you will receive your results only by: MyChart Message (if you  have MyChart) OR A paper copy in the mail If you have any lab test that is abnormal or we need to change your treatment, we will call you to review the results.   Testing/Procedures:  1`. None Ordered   Follow-Up: At [facility], you and your health needs are our priority.  As part of our continuing mission to provide you with exceptional heart care, we have created designated Provider Care Teams.  These Care Teams include your primary Cardiologist (physician) and Advanced Practice Providers (APPs -  Physician Assistants and Nurse Practitioners) who all work together to provide you with the care you need, when you need it.  We recommend signing up for the patient portal called "MyChart".  Sign up information is provided on this After Visit Summary.  MyChart is used to connect with patients for Virtual Visits (Telemedicine).  Patients are able to view lab/test results, encounter notes, upcoming appointments, etc.  Non-urgent messages can be sent to your provider as well.   To learn more about what you can do with MyChart, go to [email or web address].    Your next appointment:   12 month(s)  Provider:   You may see Debbe Odea, MD or one of the following Advanced Practice Providers on your designated Care Team:   Nicolasa Ducking, NP Eula Listen, PA-C Cadence Fransico Michael, PA-C Charlsie Quest, NP    Signed, Debbe Odea, MD  07/25/2022 9:45 AM    [facility]

## 2022-07-25 NOTE — Patient Instructions (Signed)
Medication Instructions:   DECREASE Carvedilol - Take one tablet (6.25mg ) by mouth twice a day.  If you have plenty of the 12.5mg  tablets you can cut those in half until you run out.   *If you need a refill on your cardiac medications before your next appointment, please call your pharmacy*   Lab Work:  None Ordered  If you have labs (blood work) drawn today and your tests are completely normal, you will receive your results only by: MyChart Message (if you have MyChart) OR A paper copy in the mail If you have any lab test that is abnormal or we need to change your treatment, we will call you to review the results.   Testing/Procedures:  1`. None Ordered   Follow-Up: At [facility], you and your health needs are our priority.  As part of our continuing mission to provide you with exceptional heart care, we have created designated Provider Care Teams.  These Care Teams include your primary Cardiologist (physician) and Advanced Practice Providers (APPs -  Physician Assistants and Nurse Practitioners) who all work together to provide you with the care you need, when you need it.  We recommend signing up for the patient portal called "MyChart".  Sign up information is provided on this After Visit Summary.  MyChart is used to connect with patients for Virtual Visits (Telemedicine).  Patients are able to view lab/test results, encounter notes, upcoming appointments, etc.  Non-urgent messages can be sent to your provider as well.   To learn more about what you can do with MyChart, go to [email or web address].    Your next appointment:   12 month(s)  Provider:   You may see Debbe Odea, MD or one of the following Advanced Practice Providers on your designated Care Team:   Nicolasa Ducking, NP Eula Listen, PA-C Cadence Fransico Michael, PA-C Charlsie Quest, NP

## 2022-08-04 ENCOUNTER — Other Ambulatory Visit (INDEPENDENT_AMBULATORY_CARE_PROVIDER_SITE_OTHER): Payer: Self-pay | Admitting: Nurse Practitioner

## 2022-08-04 DIAGNOSIS — I739 Peripheral vascular disease, unspecified: Secondary | ICD-10-CM

## 2022-08-12 ENCOUNTER — Encounter (INDEPENDENT_AMBULATORY_CARE_PROVIDER_SITE_OTHER): Payer: Federal, State, Local not specified - PPO

## 2022-08-12 ENCOUNTER — Ambulatory Visit (INDEPENDENT_AMBULATORY_CARE_PROVIDER_SITE_OTHER): Payer: Federal, State, Local not specified - PPO | Admitting: Nurse Practitioner

## 2022-09-10 ENCOUNTER — Ambulatory Visit (INDEPENDENT_AMBULATORY_CARE_PROVIDER_SITE_OTHER): Payer: Federal, State, Local not specified - PPO

## 2022-09-10 ENCOUNTER — Encounter (INDEPENDENT_AMBULATORY_CARE_PROVIDER_SITE_OTHER): Payer: Self-pay | Admitting: Nurse Practitioner

## 2022-09-10 ENCOUNTER — Ambulatory Visit (INDEPENDENT_AMBULATORY_CARE_PROVIDER_SITE_OTHER): Payer: Federal, State, Local not specified - PPO | Admitting: Nurse Practitioner

## 2022-09-10 VITALS — BP 133/86 | HR 53 | Resp 16 | Ht 72.0 in | Wt 178.4 lb

## 2022-09-10 DIAGNOSIS — Z9889 Other specified postprocedural states: Secondary | ICD-10-CM

## 2022-09-10 DIAGNOSIS — I739 Peripheral vascular disease, unspecified: Secondary | ICD-10-CM | POA: Diagnosis not present

## 2022-09-10 DIAGNOSIS — E785 Hyperlipidemia, unspecified: Secondary | ICD-10-CM

## 2022-09-10 DIAGNOSIS — I10 Essential (primary) hypertension: Secondary | ICD-10-CM | POA: Diagnosis not present

## 2022-09-10 NOTE — Progress Notes (Signed)
Subjective:    Patient ID: Roberto Walker, male    DOB: December 28, 1960, 62 y.o.   MRN: [id number] Chief Complaint  Patient presents with   Follow-up    12 month  f/u     Roberto Walker is a 63 year old male that returns to the office for followup and review of the noninvasive studies.   There have been no interval changes in lower extremity symptoms. No interval shortening of the patient's claudication distance or development of rest pain symptoms. No new ulcers or wounds have occurred since the last visit.  Overall he has been doing very well.  There have been no significant changes to the patient's overall health care.  The patient denies amaurosis fugax or recent TIA symptoms. There are no documented recent neurological changes noted. There is no history of DVT, PE or superficial thrombophlebitis. The patient denies recent episodes of angina or shortness of breath.   ABI Rt=1.21 and Lt=1.16  (previous ABI's Rt=1.41 and Lt=1.14) Duplex ultrasound of the bilateral tibial arteries show triphasic waveforms with normal toe waveforms bilaterally.    Review of Systems  All other systems reviewed and are negative.      Objective:   Physical Exam Vitals reviewed.  HENT:     Head: Normocephalic.  Cardiovascular:     Rate and Rhythm: Normal rate.     Pulses:          Dorsalis pedis pulses are detected w/ Doppler on the right side and detected w/ Doppler on the left side.       Posterior tibial pulses are detected w/ Doppler on the right side and detected w/ Doppler on the left side.  Pulmonary:     Effort: Pulmonary effort is normal.  Musculoskeletal:     Right lower leg: No edema.     Left lower leg: No edema.  Skin:    General: Skin is warm and dry.  Neurological:     Mental Status: He is alert and oriented to person, place, and time.  Psychiatric:        Mood and Affect: Mood normal.        Behavior: Behavior normal.        Thought Content: Thought content normal.         Judgment: Judgment normal.     BP 133/86 (BP Location: Right Arm)   Pulse (!) 53   Resp 16   Ht 6' (1.829 m)   Wt 178 lb 6.4 oz (80.9 kg)   BMI 24.20 kg/m   Past Medical History:  Diagnosis Date   (HFpEF) heart failure with preserved ejection fraction (HCC)    a.) TTE 10/23/2020: EF 50-55%, mild LVH, mild LAE, mild-mod RAE, mild MR, G1DD.   Aneurysm of right iliac artery (HCC)    Atherosclerosis of aorta (HCC)    Bilateral carotid artery disease (HCC)    a.) carotid doppler 11/08/2020: 1-39% BICA   Coronary artery disease    a.) LHC/PCI 01/11/2012: 80% pRCA, 80% mRCA, 30% RPDA, 40% pLCx, 20% OM1, 100% mLAD (coronary thrombectomy + PTCA + 3.5 x 18 mm Xience DES x1), 20% D1   Erectile dysfunction    a.) on PDE5i (sildenafil) PRN   Hypercalcemia    Hyperlipidemia    Hypertension    Left inguinal hernia    Long term current use of antithrombotics/antiplatelets    a.) DAPT (ASA + clopidogrel)   Metabolic syndrome    Microscopic hematuria    Osteoarthrosis  Peripheral vascular disease (HCC)    Prostate cancer (HCC)    RBBB (right bundle branch block)    Renal cell carcinoma (HCC)    a.) s/p partial RIGHT nephrectomy   Sleep apnea    a.) does not utilize nocturnal PAP therapy   ST elevation myocardial infarction (STEMI) of anterolateral wall (HCC) 01/11/2012   a.) PCI 100% mLAD --> coronary thrombectomy + PTCA + 3.5 x 18 mm Xience DES   Vitamin D deficiency    Vitiligo     Social History   Socioeconomic History   Marital status: Married    Spouse name: Vikki Ports   Number of children: 2   Years of education: College    Highest education level: Not on file  Occupational History   Occupation: RDU  Tobacco Use   Smoking status: Former    Current packs/day: 0.00    Average packs/day: 0.5 packs/day for 35.9 years (18.0 ttl pk-yrs)    Types: Cigarettes    Start date: 59    Quit date: 01/12/2012    Years since quitting: 10.6   Smokeless tobacco: Never  Vaping Use    Vaping status: Never Used  Substance and Sexual Activity   Alcohol use: Yes    Alcohol/week: 2.0 standard drinks of alcohol    Types: 2 Shots of liquor per week    Comment: Minimal alcohol consumption   Drug use: No   Sexual activity: Yes    Partners: Female  Other Topics Concern   Not on file  Social History Narrative   Not on file   Social Determinants of Health   Financial Resource Strain: Low Risk  (11/22/2021)   Overall Financial Resource Strain (CARDIA)    Difficulty of Paying Living Expenses: Not hard at all  Food Insecurity: No Food Insecurity (11/22/2021)   Hunger Vital Sign    Worried About Running Out of Food in the Last Year: Never true    Ran Out of Food in the Last Year: Never true  Transportation Needs: No Transportation Needs (11/22/2021)   PRAPARE - Administrator, Civil Service (Medical): No    Lack of Transportation (Non-Medical): No  Physical Activity: Sufficiently Active (11/22/2021)   Exercise Vital Sign    Days of Exercise per Week: 5 days    Minutes of Exercise per Session: 60 min  Stress: No Stress Concern Present (11/22/2021)   [employer] of Occupational Health - Occupational Stress Questionnaire    Feeling of Stress : Only a little  Social Connections: Moderately Integrated (11/22/2021)   Social Connection and Isolation Panel [NHANES]    Frequency of Communication with Friends and Family: More than three times a week    Frequency of Social Gatherings with Friends and Family: More than three times a week    Attends Religious Services: 1 to 4 times per year    Active Member of [employer] or Organizations: No    Attends Banker Meetings: Never    Marital Status: Married  Catering manager Violence: Not At Risk (11/22/2021)   Humiliation, Afraid, Rape, and Kick questionnaire    Fear of Current or Ex-Partner: No    Emotionally Abused: No    Physically Abused: No    Sexually Abused: No    Past Surgical History:   Procedure Laterality Date   Beam Radiation     Prostate using Photons   COLONOSCOPY WITH PROPOFOL N/A 09/18/2020   Procedure: COLONOSCOPY WITH PROPOFOL;  Surgeon: Pasty Spillers, MD;  Location: [facility] ENDOSCOPY;  Service: Endoscopy;  Laterality: N/A;   ILIAC VEIN ANGIOPLASTY / STENTING  2013   LEFT HEART CATH  01/11/2012   LOWER EXTREMITY ANGIOGRAPHY Left 10/18/2020   Procedure: LOWER EXTREMITY ANGIOGRAPHY;  Surgeon: Annice Needy, MD;  Location: [facility] INVASIVE CV LAB;  Service: Cardiovascular;  Laterality: Left;   PARTIAL NEPHRECTOMY Right 02/04/2008    Family History  Problem Relation Age of Onset   Hypertension Mother    Lung cancer Father    Hypertension Father    Hyperlipidemia Father    Diabetes Sister        oldest sister   Hypertension Sister    Hypertension Sister     Allergies  Allergen Reactions   Lisinopril Swelling    Swelling around mouth, lips       Latest Ref Rng & Units 03/13/2022   11:05 AM 07/18/2021    8:07 AM 07/31/2020   10:50 AM  CBC  WBC 4.0 - 10.5 K/uL 6.4  6.8  9.1   Hemoglobin 13.0 - 17.0 g/dL 78.2  95.6  21.3   Hematocrit 39.0 - 52.0 % 39.7  40.2  41.1   Platelets 150 - 400 K/uL 276  258  286       CMP     Component Value Date/Time   NA 136 03/13/2022 1105   NA 141 08/02/2015 0853   NA 139 05/17/2013 0447   K 3.5 03/13/2022 1105   K 4.0 05/17/2013 0447   CL 105 03/13/2022 1105   CL 109 (H) 05/17/2013 0447   CO2 23 03/13/2022 1105   CO2 23 05/17/2013 0447   GLUCOSE 116 (H) 03/13/2022 1105   GLUCOSE 150 (H) 05/17/2013 0447   BUN 16 03/13/2022 1105   BUN 15 08/02/2015 0853   BUN 19 (H) 05/17/2013 0447   CREATININE 0.92 03/13/2022 1105   CREATININE 0.97 07/18/2021 0807   CALCIUM 9.4 03/13/2022 1105   CALCIUM 9.1 05/17/2013 0447   PROT 7.4 03/13/2022 1105   PROT 7.2 08/02/2015 0853   PROT 7.5 05/02/2011 0943   ALBUMIN 3.9 03/13/2022 1105   ALBUMIN 4.2 08/02/2015 0853   ALBUMIN 3.9 05/02/2011 0943   AST 23 03/13/2022 1105    AST 31 05/02/2011 0943   ALT 18 03/13/2022 1105   ALT 37 05/02/2011 0943   ALKPHOS 58 03/13/2022 1105   ALKPHOS 54 05/02/2011 0943   BILITOT 0.7 03/13/2022 1105   BILITOT 0.3 08/02/2015 0853   BILITOT 0.5 05/02/2011 0943   EGFR 89 07/18/2021 0807   GFRNONAA >60 03/13/2022 1105   GFRNONAA 78 07/31/2020 1050     No results found.     Assessment & Plan:   1. Peripheral arterial disease with history of revascularization (HCC)  Recommend:  The patient has evidence of atherosclerosis of the lower extremities with claudication.  The patient does not voice lifestyle limiting changes at this point in time.  Noninvasive studies do not suggest clinically significant change.  No invasive studies, angiography or surgery at this time The patient should continue walking and begin a more formal exercise program.  The patient should continue antiplatelet therapy and aggressive treatment of the lipid abnormalities  No changes in the patient's medications at this time  Continued surveillance is indicated as atherosclerosis is likely to progress with time.    The patient will continue follow up with noninvasive studies as ordered.   2. Benign essential HTN Continue antihypertensive medications as already ordered, these medications have been reviewed and  there are no changes at this time.  3. Dyslipidemia Continue statin as ordered and reviewed, no changes at this time   Current Outpatient Medications on File Prior to Visit  Medication Sig Dispense Refill   amLODipine-olmesartan (AZOR) 10-40 MG tablet Take 1 tablet by mouth daily. 90 tablet 1   aspirin 81 MG tablet Take 1 tablet by mouth daily.     atorvastatin (LIPITOR) 80 MG tablet Take 1 tablet (80 mg total) by mouth daily. (Patient taking differently: Take 80 mg by mouth at bedtime.) 90 tablet 1   carvedilol (COREG) 6.25 MG tablet Take 1 tablet (6.25 mg total) by mouth 2 (two) times daily. 180 tablet 0   cholecalciferol (VITAMIN D)  1000 UNITS tablet Take 1 tablet by mouth daily.     clopidogrel (PLAVIX) 75 MG tablet Take 1 tablet (75 mg total) by mouth daily. 90 tablet 1   nitroGLYCERIN (NITROSTAT) 0.4 MG SL tablet Place 1 tablet (0.4 mg total) under the tongue daily. 30 tablet 0   sildenafil (VIAGRA) 50 MG tablet Take 50 mg by mouth as needed.     No current facility-administered medications on file prior to visit.    There are no Patient Instructions on file for this visit. No follow-ups on file.   Georgiana Spinner, NP

## 2022-09-24 ENCOUNTER — Other Ambulatory Visit: Payer: Self-pay | Admitting: Family Medicine

## 2022-09-24 DIAGNOSIS — I10 Essential (primary) hypertension: Secondary | ICD-10-CM

## 2022-09-24 NOTE — Progress Notes (Unsigned)
Name: Roberto Walker   MRN: [id number]    DOB: 02-02-1961   Date:09/25/2022       Progress Note  Subjective  Chief Complaint  Follow Up  HPI  HTN: bp is slightly above goal today, he states he was rushing to come in.Marland KitchenHe denies side effects of medications, denies decrease in exercise tolerance , chest pain, palpitation or dizziness.    History of CAD: cad S/P DES to LAD 01/11/2012, he also has 80% RCA disease who presents for f/u, doing well last EF 50 % in 2019 Taking statin therapy, aspirin and plavix daily . He is up to date with Dr. Myriam Forehand - cardiologist He continues to work out, walks 30 minutes per day.  Hyperlipidemia: taking Atorvastatin that he gets through the [place], and is compliant , denies myalgia. Labs were  reviewed and LDL was 47 . Continue current regiment Recheck labs today    OSA: he stopped wearing his CPAP since he retired in Feb 2022  No headaches in the mornings. He states he could not tolerate it, wife states he is no longer snoring . Unchanged    History of kidney and prostate cancer : he is going to [facility] to check on prostate once every 12  months. S/p radiation . He was seeing Urologist at [facility] but is now going to [facility], last PSA June 2022 was normal, because of some hematuria he had repeat CT in 09/2019 showed some cysts, advised to have cystoscopy but he declined the procedure. He denies any hematuria , no flank pain, fever  . He does not follow up with Urologist  We ill recheck ua and PSA and urinalysis today    Aneurysm of right common iliac : incidental finding on CT done for hematuria , discussed need for yearly follow up with Dr. Wyn Quaker yearly. He is on statin therapy, Plavix and  aspirin    PVD: sees Dr. Wyn Quaker once a year,he developed claudication Summer 2022 had a stent placed and is doing well since Still on Plavix and aspirin and statin therapy . He is very active in his garden, also playing with grandson and walks and bike rides on a regular basis    Pre-diabetes: he has been following life style modification such as exercise.  He denies polyphagia, polydipsia or polyuria. He is eats a balanced diet, grains, fish, chicken, vegetables. Last A1C was up again from 5.6 % to 5.8 % and down to 5.4 %    ED: he has difficulty initiating and maintaining erection since prostate radiation therapy, he is now on Viagra, getting it through the [place] . Unchanged   Patient Active Problem List   Diagnosis Date Noted   Left inguinal hernia 11/22/2021   Aneurysm of right iliac artery (HCC) 06/26/2021   Atherosclerosis of native arteries of extremity with intermittent claudication (HCC) 02/08/2021   History of colonic polyps    Polyp of colon    Atherosclerosis of aorta (HCC) 07/31/2020   Personal history of kidney cancer 02/06/2015   Benign essential HTN 08/02/2014   Coronary artery disease involving native coronary artery of native heart without angina pectoris 08/02/2014   Dyslipidemia 08/02/2014   H/O acute myocardial infarction 08/02/2014   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/02/2014   Dysmetabolic syndrome 08/02/2014   Arthritis, degenerative 08/02/2014   Peripheral vascular disease (HCC) 08/02/2014   Blood glucose elevated 08/02/2014   OSA (obstructive sleep apnea) 08/02/2014   Vitamin D deficiency 08/02/2014  Vitiligo 08/02/2014    Past Surgical History:  Procedure Laterality Date   Beam Radiation     Prostate using Photons   COLONOSCOPY WITH PROPOFOL N/A 09/18/2020   Procedure: COLONOSCOPY WITH PROPOFOL;  Surgeon: Pasty Spillers, MD;  Location: [facility] ENDOSCOPY;  Service: Endoscopy;  Laterality: N/A;   ILIAC VEIN ANGIOPLASTY / STENTING  2013   LEFT HEART CATH  01/11/2012   LOWER EXTREMITY ANGIOGRAPHY Left 10/18/2020   Procedure: LOWER EXTREMITY ANGIOGRAPHY;  Surgeon: Annice Needy, MD;  Location: [facility] INVASIVE CV LAB;  Service: Cardiovascular;  Laterality: Left;   PARTIAL NEPHRECTOMY Right 02/04/2008     Family History  Problem Relation Age of Onset   Hypertension Mother    Lung cancer Father    Hypertension Father    Hyperlipidemia Father    Diabetes Sister        oldest sister   Hypertension Sister    Hypertension Sister     Social History   Tobacco Use   Smoking status: Former    Current packs/day: 0.00    Average packs/day: 0.5 packs/day for 35.9 years (18.0 ttl pk-yrs)    Types: Cigarettes    Start date: 89    Quit date: 01/12/2012    Years since quitting: 10.7   Smokeless tobacco: Never  Substance Use Topics   Alcohol use: Yes    Alcohol/week: 2.0 standard drinks of alcohol    Types: 2 Shots of liquor per week    Comment: Minimal alcohol consumption     Current Outpatient Medications:    aspirin 81 MG tablet, Take 1 tablet by mouth daily., Disp: , Rfl:    atorvastatin (LIPITOR) 80 MG tablet, Take 1 tablet (80 mg total) by mouth daily. (Patient taking differently: Take 80 mg by mouth at bedtime.), Disp: 90 tablet, Rfl: 1   carvedilol (COREG) 6.25 MG tablet, Take 1 tablet (6.25 mg total) by mouth 2 (two) times daily., Disp: 180 tablet, Rfl: 0   cholecalciferol (VITAMIN D) 1000 UNITS tablet, Take 1 tablet by mouth daily., Disp: , Rfl:    clopidogrel (PLAVIX) 75 MG tablet, Take 1 tablet (75 mg total) by mouth daily., Disp: 90 tablet, Rfl: 1   nitroGLYCERIN (NITROSTAT) 0.4 MG SL tablet, Place 1 tablet (0.4 mg total) under the tongue daily., Disp: 30 tablet, Rfl: 0   sildenafil (VIAGRA) 50 MG tablet, Take 50 mg by mouth as needed., Disp: , Rfl:    amLODipine-olmesartan (AZOR) 10-40 MG tablet, Take 1 tablet by mouth daily., Disp: 90 tablet, Rfl: 3  Allergies  Allergen Reactions   Lisinopril Swelling    Swelling around mouth, lips    I personally reviewed active problem list, medication list, allergies, family history, social history, health maintenance with the patient/caregiver today.   ROS  Ten systems reviewed and is negative except as mentioned in HPI     Objective  Vitals:   09/25/22 0810  BP: 132/82  Pulse: 70  Resp: 14  Temp: 98.2 F (36.8 C)  TempSrc: Oral  SpO2: 99%  Weight: 179 lb 1.6 oz (81.2 kg)  Height: 6' (1.829 m)    Body mass index is 24.29 kg/m.  Physical Exam  Constitutional: Patient appears well-developed and well-nourished.  No distress.  HEENT: head atraumatic, normocephalic, pupils equal and reactive to light, neck supple Cardiovascular: Normal rate, regular rhythm and normal heart sounds.  No murmur heard. No BLE edema. Pulmonary/Chest: Effort normal and breath sounds normal. No respiratory distress. Abdominal: Soft.  There is no  tenderness. Psychiatric: Patient has a normal mood and affect. behavior is normal. Judgment and thought content normal.   Recent Results (from the past 2160 hour(s))  VAS [place] ABI WITH/WO TBI     Status: None   Collection Time: 09/10/22  8:33 AM  Result Value Ref Range   Right ABI 1.21    Left ABI 1.16     PHQ2/9:    09/25/2022    8:12 AM 03/27/2022    9:06 AM 02/25/2022    9:45 AM 11/22/2021    8:57 AM 06/26/2021    3:28 PM  Depression screen PHQ 2/9  Decreased Interest 0 0 0 0 0  Down, Depressed, Hopeless 0 0 0 0 0  PHQ - 2 Score 0 0 0 0 0  Altered sleeping 0 0 0 0   Tired, decreased energy 0 0 0 0   Change in appetite 0 0 0 0   Feeling bad or failure about yourself  0 0 0 0   Trouble concentrating 0 0 0 0   Moving slowly or fidgety/restless 0 0 0 0   Suicidal thoughts 0 0 0 0   PHQ-9 Score 0 0 0 0     phq 9 is negative   Fall Risk:    09/25/2022    8:12 AM 04/03/2022    2:33 PM 03/27/2022    9:05 AM 02/25/2022    9:45 AM 11/22/2021    8:56 AM  Fall Risk   Falls in the past year? 0 0 0 0 0  Number falls in past yr:     0  Injury with Fall?     0  Risk for fall due to : No Fall Risks  No Fall Risks No Fall Risks No Fall Risks  Follow up Falls prevention discussed  Falls prevention discussed Falls prevention discussed Falls prevention discussed     Assessment & Plan  1. Atherosclerosis of aorta (HCC)  - Lipid panel  2. Aneurysm of right iliac artery [facility])  Seeing vascular surgeon   3. Peripheral vascular disease (HCC)  Up to date with follow ups, on statin and plavix  4. History of prostate cancer  - PSA  5. Vitamin D deficiency  Continue supplementation  6. Dysmetabolic syndrome  - Hemoglobin A1c  7. H/O acute myocardial infarction   8. Benign essential HTN  - amLODipine-olmesartan (AZOR) 10-40 MG tablet; Take 1 tablet by mouth daily.  Dispense: 90 tablet; Refill: 3 - CBC with Differential/Platelet - COMPLETE METABOLIC PANEL WITH GFR  9. OSA (obstructive sleep apnea)  Could not tolerate it  10. Personal history of kidney cancer  - Urinalysis, Complete

## 2022-09-25 ENCOUNTER — Encounter: Payer: Self-pay | Admitting: Family Medicine

## 2022-09-25 ENCOUNTER — Ambulatory Visit (INDEPENDENT_AMBULATORY_CARE_PROVIDER_SITE_OTHER): Payer: Federal, State, Local not specified - PPO | Admitting: Family Medicine

## 2022-09-25 VITALS — BP 132/82 | HR 70 | Temp 98.2°F | Resp 14 | Ht 72.0 in | Wt 179.1 lb

## 2022-09-25 DIAGNOSIS — Z85528 Personal history of other malignant neoplasm of kidney: Secondary | ICD-10-CM

## 2022-09-25 DIAGNOSIS — I10 Essential (primary) hypertension: Secondary | ICD-10-CM | POA: Diagnosis not present

## 2022-09-25 DIAGNOSIS — E559 Vitamin D deficiency, unspecified: Secondary | ICD-10-CM

## 2022-09-25 DIAGNOSIS — I739 Peripheral vascular disease, unspecified: Secondary | ICD-10-CM | POA: Diagnosis not present

## 2022-09-25 DIAGNOSIS — I252 Old myocardial infarction: Secondary | ICD-10-CM

## 2022-09-25 DIAGNOSIS — I723 Aneurysm of iliac artery: Secondary | ICD-10-CM

## 2022-09-25 DIAGNOSIS — E8881 Metabolic syndrome: Secondary | ICD-10-CM | POA: Diagnosis not present

## 2022-09-25 DIAGNOSIS — I700 Atherosclerosis of aorta: Secondary | ICD-10-CM

## 2022-09-25 DIAGNOSIS — G4733 Obstructive sleep apnea (adult) (pediatric): Secondary | ICD-10-CM

## 2022-09-25 DIAGNOSIS — Z8546 Personal history of malignant neoplasm of prostate: Secondary | ICD-10-CM

## 2022-09-25 MED ORDER — AMLODIPINE-OLMESARTAN 10-40 MG PO TABS: 1.0000 | ORAL_TABLET | Freq: Every day | ORAL | 3 refills | Status: DC

## 2022-09-26 ENCOUNTER — Other Ambulatory Visit (INDEPENDENT_AMBULATORY_CARE_PROVIDER_SITE_OTHER): Payer: Self-pay | Admitting: Nurse Practitioner

## 2022-09-26 DIAGNOSIS — Z9889 Other specified postprocedural states: Secondary | ICD-10-CM

## 2022-09-26 LAB — COMPLETE METABOLIC PANEL WITH GFR
AG Ratio: 1.4 (calc) (ref 1.0–2.5)
ALT: 16 U/L (ref 9–46)
AST: 19 U/L (ref 10–35)
Albumin: 4.1 g/dL (ref 3.6–5.1)
Alkaline phosphatase (APISO): 66 U/L (ref 35–144)
BUN: 17 mg/dL (ref 7–25)
CO2: 22 mmol/L (ref 20–32)
Calcium: 10 mg/dL (ref 8.6–10.3)
Chloride: 110 mmol/L (ref 98–110)
Creat: 1.01 mg/dL (ref 0.70–1.35)
Globulin: 2.9 g/dL (ref 1.9–3.7)
Glucose, Bld: 105 mg/dL — ABNORMAL HIGH (ref 65–99)
Potassium: 4.2 mmol/L (ref 3.5–5.3)
Sodium: 140 mmol/L (ref 135–146)
Total Bilirubin: 0.4 mg/dL (ref 0.2–1.2)
Total Protein: 7 g/dL (ref 6.1–8.1)
eGFR: 84 mL/min/{1.73_m2} (ref >=60)

## 2022-09-26 LAB — CBC WITH DIFFERENTIAL/PLATELET
Absolute Monocytes: 600 {cells}/uL (ref 200–950)
Basophils Absolute: 21 {cells}/uL (ref 0–200)
Basophils Relative: 0.3 %
Eosinophils Absolute: 186 {cells}/uL (ref 15–500)
Eosinophils Relative: 2.7 %
HCT: 39.6 % (ref 38.5–50.0)
Hemoglobin: 13.2 g/dL (ref 13.2–17.1)
Lymphs Abs: 2491 cells/uL (ref 850–3900)
MCH: 32.5 pg (ref 27.0–33.0)
MCHC: 33.3 g/dL (ref 32.0–36.0)
MCV: 97.5 fL (ref 80.0–100.0)
MPV: 8.7 fL (ref 7.5–12.5)
Monocytes Relative: 8.7 %
Neutro Abs: 3602 {cells}/uL (ref 1500–7800)
Neutrophils Relative %: 52.2 %
Platelets: 260 10*3/uL (ref 140–400)
RBC: 4.06 10*6/uL — ABNORMAL LOW (ref 4.20–5.80)
RDW: 13.7 % (ref 11.0–15.0)
Total Lymphocyte: 36.1 %
WBC: 6.9 10*3/uL (ref 3.8–10.8)

## 2022-09-26 LAB — LIPID PANEL
Cholesterol: 126 mg/dL (ref <200)
HDL: 72 mg/dL (ref >=40)
LDL Cholesterol (Calc): 41 mg/dL
Non-HDL Cholesterol (Calc): 54 mg/dL (ref <130)
Total CHOL/HDL Ratio: 1.8 (calc) (ref <5.0)
Triglycerides: 44 mg/dL (ref <150)

## 2022-09-26 LAB — URINALYSIS, COMPLETE
Bacteria, UA: NONE SEEN /HPF
Bilirubin Urine: NEGATIVE
Glucose, UA: NEGATIVE
Hyaline Cast: NONE SEEN /LPF
Ketones, ur: NEGATIVE
Leukocytes,Ua: NEGATIVE
Nitrite: NEGATIVE
Protein, ur: NEGATIVE
Specific Gravity, Urine: 1.02 (ref 1.001–1.035)
Squamous Epithelial / HPF: NONE SEEN /HPF (ref <=5)
WBC, UA: NONE SEEN /HPF (ref 0–5)
pH: 5.5 (ref 5.0–8.0)

## 2022-09-26 LAB — HEMOGLOBIN A1C
Hgb A1c MFr Bld: 5.7 %{Hb} — ABNORMAL HIGH (ref <5.7)
Mean Plasma Glucose: 117 mg/dL
eAG (mmol/L): 6.5 mmol/L

## 2022-09-26 LAB — PSA: PSA: 0.05 ng/mL (ref <=4.00)

## 2022-10-07 ENCOUNTER — Ambulatory Visit (INDEPENDENT_AMBULATORY_CARE_PROVIDER_SITE_OTHER): Payer: Federal, State, Local not specified - PPO | Admitting: Nurse Practitioner

## 2022-10-07 ENCOUNTER — Encounter (INDEPENDENT_AMBULATORY_CARE_PROVIDER_SITE_OTHER): Payer: Self-pay | Admitting: Nurse Practitioner

## 2022-10-07 ENCOUNTER — Ambulatory Visit (INDEPENDENT_AMBULATORY_CARE_PROVIDER_SITE_OTHER): Payer: Federal, State, Local not specified - PPO

## 2022-10-07 VITALS — BP 120/76 | HR 50 | Resp 16 | Wt 177.6 lb

## 2022-10-07 DIAGNOSIS — I739 Peripheral vascular disease, unspecified: Secondary | ICD-10-CM

## 2022-10-07 DIAGNOSIS — Z9889 Other specified postprocedural states: Secondary | ICD-10-CM

## 2022-10-07 DIAGNOSIS — I723 Aneurysm of iliac artery: Secondary | ICD-10-CM

## 2022-10-07 DIAGNOSIS — I10 Essential (primary) hypertension: Secondary | ICD-10-CM

## 2022-10-07 NOTE — Progress Notes (Signed)
Subjective:    Patient ID: Roberto Walker, male    DOB: 02-19-1960, 62 y.o.   MRN: [id number] Chief Complaint  Patient presents with   Follow-up    Ultrasound follow up    The patient returns to the office for surveillance of a known right common iliac artery aneurysm.  He was seen earlier this year for his peripheral arterial disease which is stable.  Patient denies abdominal pain or back pain, no other abdominal complaints. No changes suggesting embolic episodes.   There have been no interval changes in the patient's overall health care since his last visit.  Patient denies amaurosis fugax or TIA symptoms. There is no history of claudication or rest pain symptoms of the lower extremities. The patient denies angina or shortness of breath.   Duplex US of the aorta and iliac arteries shows an an abdominal aorta 2.36 cm.  A right common iliac artery aneurysm of 2.4 cm and a right common iliac artery aneurysm of 2.1 cm    Review of Systems  All other systems reviewed and are negative.      Objective:   Physical Exam Vitals reviewed.  HENT:     Head: Normocephalic.  Cardiovascular:     Rate and Rhythm: Normal rate.     Pulses:          Dorsalis pedis pulses are detected w/ Doppler on the right side and detected w/ Doppler on the left side.       Posterior tibial pulses are detected w/ Doppler on the right side and detected w/ Doppler on the left side.  Pulmonary:     Effort: Pulmonary effort is normal.  Skin:    General: Skin is warm and dry.  Neurological:     Mental Status: He is alert and oriented to person, place, and time.  Psychiatric:        Mood and Affect: Mood normal.        Behavior: Behavior normal.        Thought Content: Thought content normal.        Judgment: Judgment normal.     BP 120/76 (BP Location: Left Arm)   Pulse (!) 50   Resp 16   Wt 177 lb 9.6 oz (80.6 kg)   BMI 24.09 kg/m   Past Medical History:  Diagnosis Date   (HFpEF) heart failure  with preserved ejection fraction (HCC)    a.) TTE 10/23/2020: EF 50-55%, mild LVH, mild LAE, mild-mod RAE, mild MR, G1DD.   Aneurysm of right iliac artery (HCC)    Atherosclerosis of aorta (HCC)    Bilateral carotid artery disease (HCC)    a.) carotid doppler 11/08/2020: 1-39% BICA   Coronary artery disease    a.) LHC/PCI 01/11/2012: 80% pRCA, 80% mRCA, 30% RPDA, 40% pLCx, 20% OM1, 100% mLAD (coronary thrombectomy + PTCA + 3.5 x 18 mm Xience DES x1), 20% D1   Erectile dysfunction    a.) on PDE5i (sildenafil) PRN   Hypercalcemia    Hyperlipidemia    Hypertension    Left inguinal hernia    Long term current use of antithrombotics/antiplatelets    a.) DAPT (ASA + clopidogrel)   Metabolic syndrome    Microscopic hematuria    Osteoarthrosis    Peripheral vascular disease (HCC)    Prostate cancer (HCC)    RBBB (right bundle branch block)    Renal cell carcinoma (HCC)    a.) s/p partial RIGHT nephrectomy   Sleep apnea  a.) does not utilize nocturnal PAP therapy   ST elevation myocardial infarction (STEMI) of anterolateral wall (HCC) 01/11/2012   a.) PCI 100% mLAD --> coronary thrombectomy + PTCA + 3.5 x 18 mm Xience DES   Vitamin D deficiency    Vitiligo     Social History   Socioeconomic History   Marital status: Married    Spouse name: Vikki Ports   Number of children: 2   Years of education: College    Highest education level: Not on file  Occupational History   Occupation: RDU  Tobacco Use   Smoking status: Former    Current packs/day: 0.00    Average packs/day: 0.5 packs/day for 35.9 years (18.0 ttl pk-yrs)    Types: Cigarettes    Start date: 59    Quit date: 01/12/2012    Years since quitting: 10.7   Smokeless tobacco: Never  Vaping Use   Vaping status: Never Used  Substance and Sexual Activity   Alcohol use: Yes    Alcohol/week: 2.0 standard drinks of alcohol    Types: 2 Shots of liquor per week    Comment: Minimal alcohol consumption   Drug use: No   Sexual  activity: Yes    Partners: Female  Other Topics Concern   Not on file  Social History Narrative   Not on file   Social Determinants of Health   Financial Resource Strain: Low Risk  (11/22/2021)   Overall Financial Resource Strain (CARDIA)    Difficulty of Paying Living Expenses: Not hard at all  Food Insecurity: No Food Insecurity (11/22/2021)   Hunger Vital Sign    Worried About Running Out of Food in the Last Year: Never true    Ran Out of Food in the Last Year: Never true  Transportation Needs: No Transportation Needs (11/22/2021)   PRAPARE - Administrator, Civil Service (Medical): No    Lack of Transportation (Non-Medical): No  Physical Activity: Sufficiently Active (11/22/2021)   Exercise Vital Sign    Days of Exercise per Week: 5 days    Minutes of Exercise per Session: 60 min  Stress: No Stress Concern Present (11/22/2021)   [employer] of Occupational Health - Occupational Stress Questionnaire    Feeling of Stress : Only a little  Social Connections: Moderately Integrated (11/22/2021)   Social Connection and Isolation Panel [NHANES]    Frequency of Communication with Friends and Family: More than three times a week    Frequency of Social Gatherings with Friends and Family: More than three times a week    Attends Religious Services: 1 to 4 times per year    Active Member of [employer] or Organizations: No    Attends Banker Meetings: Never    Marital Status: Married  Catering manager Violence: Not At Risk (11/22/2021)   Humiliation, Afraid, Rape, and Kick questionnaire    Fear of Current or Ex-Partner: No    Emotionally Abused: No    Physically Abused: No    Sexually Abused: No    Past Surgical History:  Procedure Laterality Date   Beam Radiation     Prostate using Photons   COLONOSCOPY WITH PROPOFOL N/A 09/18/2020   Procedure: COLONOSCOPY WITH PROPOFOL;  Surgeon: Pasty Spillers, MD;  Location: [facility] ENDOSCOPY;  Service: Endoscopy;   Laterality: N/A;   ILIAC VEIN ANGIOPLASTY / STENTING  2013   LEFT HEART CATH  01/11/2012   LOWER EXTREMITY ANGIOGRAPHY Left 10/18/2020   Procedure: LOWER EXTREMITY ANGIOGRAPHY;  Surgeon:  Annice Needy, MD;  Location: [facility] INVASIVE CV LAB;  Service: Cardiovascular;  Laterality: Left;   PARTIAL NEPHRECTOMY Right 02/04/2008    Family History  Problem Relation Age of Onset   Hypertension Mother    Lung cancer Father    Hypertension Father    Hyperlipidemia Father    Diabetes Sister        oldest sister   Hypertension Sister    Hypertension Sister     Allergies  Allergen Reactions   Lisinopril Swelling    Swelling around mouth, lips       Latest Ref Rng & Units 09/25/2022    9:24 AM 03/13/2022   11:05 AM 07/18/2021    8:07 AM  CBC  WBC 3.8 - 10.8 Thousand/uL 6.9  6.4  6.8   Hemoglobin 13.2 - 17.1 g/dL 72.5  36.6  44.0   Hematocrit 38.5 - 50.0 % 39.6  39.7  40.2   Platelets 140 - 400 Thousand/uL 260  276  258       CMP     Component Value Date/Time   NA 140 09/25/2022 0924   NA 141 08/02/2015 0853   NA 139 05/17/2013 0447   K 4.2 09/25/2022 0924   K 4.0 05/17/2013 0447   CL 110 09/25/2022 0924   CL 109 (H) 05/17/2013 0447   CO2 22 09/25/2022 0924   CO2 23 05/17/2013 0447   GLUCOSE 105 (H) 09/25/2022 0924   GLUCOSE 150 (H) 05/17/2013 0447   BUN 17 09/25/2022 0924   BUN 15 08/02/2015 0853   BUN 19 (H) 05/17/2013 0447   CREATININE 1.01 09/25/2022 0924   CALCIUM 10.0 09/25/2022 0924   CALCIUM 9.1 05/17/2013 0447   PROT 7.0 09/25/2022 0924   PROT 7.2 08/02/2015 0853   PROT 7.5 05/02/2011 0943   ALBUMIN 3.9 03/13/2022 1105   ALBUMIN 4.2 08/02/2015 0853   ALBUMIN 3.9 05/02/2011 0943   AST 19 09/25/2022 0924   AST 31 05/02/2011 0943   ALT 16 09/25/2022 0924   ALT 37 05/02/2011 0943   ALKPHOS 58 03/13/2022 1105   ALKPHOS 54 05/02/2011 0943   BILITOT 0.4 09/25/2022 0924   BILITOT 0.3 08/02/2015 0853   BILITOT 0.5 05/02/2011 0943   EGFR 84 09/25/2022 0924    GFRNONAA >60 03/13/2022 1105   GFRNONAA 78 07/31/2020 1050     VAS [place] ABI WITH/WO TBI  Result Date: 09/12/2022  LOWER EXTREMITY DOPPLER STUDY Patient Name:  TRAYCE KEES  Date of Exam:   09/10/2022 Medical Rec #: [id number]      Accession #:    [id number] Date of Birth: [birth year]     Patient Gender: M Patient Age:   37 years Exam Location:  [facility] Procedure:      VAS [place] ABI WITH/WO TBI Referring Phys: --------------------------------------------------------------------------------  Indications: Peripheral artery disease.  Vascular Interventions: 2012: Left EIA stent;                         05/16/2013: Left CIA stent with left EIA, popliteal, TP                         trunk, posterior tibial & anterior tibial artery PTAs;                         10/18/2020: Left EIA stent;. Performing Technologist: Salvadore Farber RVT  Examination Guidelines: A complete  evaluation includes at minimum, Doppler waveform signals and systolic blood pressure reading at the level of bilateral brachial, anterior tibial, and posterior tibial arteries, when vessel segments are accessible. Bilateral testing is considered an integral part of a complete examination. Photoelectric Plethysmograph (PPG) waveforms and toe systolic pressure readings are included as required and additional duplex testing as needed. Limited examinations for reoccurring indications may be performed as noted.  ABI Findings: +---------+------------------+-----+---------+--------+ Right    Rt Pressure (mmHg)IndexWaveform Comment  +---------+------------------+-----+---------+--------+ Brachial 111                                      +---------+------------------+-----+---------+--------+ ATA      127               1.13 triphasic         +---------+------------------+-----+---------+--------+ PTA      135               1.21 triphasic         +---------+------------------+-----+---------+--------+ Great Toe129                1.15 Normal            +---------+------------------+-----+---------+--------+ +---------+------------------+-----+---------+-------+ Left     Lt Pressure (mmHg)IndexWaveform Comment +---------+------------------+-----+---------+-------+ Brachial 112                                     +---------+------------------+-----+---------+-------+ ATA      128               1.14 triphasic        +---------+------------------+-----+---------+-------+ PTA      130               1.16 triphasic        +---------+------------------+-----+---------+-------+ Great Toe130               1.16 Normal           +---------+------------------+-----+---------+-------+ +-------+-----------+-----------+------------+------------+ ABI/TBIToday's ABIToday's TBIPrevious ABIPrevious TBI +-------+-----------+-----------+------------+------------+ Right  1.21       1.15       1.41        .95          +-------+-----------+-----------+------------+------------+ Left   1.16       1.16       1.14        .73          +-------+-----------+-----------+------------+------------+ Bilateral ABIs and TBIs appear essentially unchanged compared to prior study on 08/2021.  Summary: Right: Resting right ankle-brachial index is within normal range. The right toe-brachial index is normal. Left: Resting left ankle-brachial index is within normal range. The left toe-brachial index is normal. *See table(s) above for measurements and observations.  Electronically signed by Festus Barren MD on 09/12/2022 at 12:04:59 PM.    Final        Assessment & Plan:   1. Peripheral arterial disease with history of revascularization [facility]) Patient recently had noninvasive studies about a month ago which were stable.  Patient will follow-up annually for this.  2. Aneurysm of iliac artery (HCC) Has been several years since his iliac artery aneurysms are evaluated and there has been some growth noted but not enough to be considered  rapid growth.  He has a 2.4 cm right common iliac artery aneurysm and 2.1 left common iliac artery aneurysm.  We will  continue to follow these at his next follow-up.  We discussed the natural progression of aneurysms as well as the typical repair for this.  Patient is advised to contact our office if there is embolism or occlusion. 3. Benign essential HTN Continue antihypertensive medications as already ordered, these medications have been reviewed and there are no changes at this time.   Current Outpatient Medications on File Prior to Visit  Medication Sig Dispense Refill   amLODipine-olmesartan (AZOR) 10-40 MG tablet Take 1 tablet by mouth daily. 90 tablet 3   aspirin 81 MG tablet Take 1 tablet by mouth daily.     atorvastatin (LIPITOR) 80 MG tablet Take 1 tablet (80 mg total) by mouth daily. (Patient taking differently: Take 80 mg by mouth at bedtime.) 90 tablet 1   carvedilol (COREG) 6.25 MG tablet Take 1 tablet (6.25 mg total) by mouth 2 (two) times daily. 180 tablet 0   cholecalciferol (VITAMIN D) 1000 UNITS tablet Take 1 tablet by mouth daily.     clopidogrel (PLAVIX) 75 MG tablet Take 1 tablet (75 mg total) by mouth daily. 90 tablet 1   nitroGLYCERIN (NITROSTAT) 0.4 MG SL tablet Place 1 tablet (0.4 mg total) under the tongue daily. 30 tablet 0   sildenafil (VIAGRA) 50 MG tablet Take 50 mg by mouth as needed.     No current facility-administered medications on file prior to visit.    There are no Patient Instructions on file for this visit. No follow-ups on file.   Georgiana Spinner, NP

## 2022-10-24 ENCOUNTER — Other Ambulatory Visit: Payer: Self-pay

## 2022-10-24 DIAGNOSIS — I10 Essential (primary) hypertension: Secondary | ICD-10-CM

## 2022-10-24 DIAGNOSIS — I2510 Atherosclerotic heart disease of native coronary artery without angina pectoris: Secondary | ICD-10-CM

## 2022-10-24 MED ORDER — CARVEDILOL 6.25 MG PO TABS
6.2500 mg | ORAL_TABLET | Freq: Two times a day (BID) | ORAL | 0 refills | Status: AC
Start: 2022-10-24 — End: 2023-12-15

## 2022-12-24 ENCOUNTER — Other Ambulatory Visit: Payer: Self-pay | Admitting: Family Medicine

## 2022-12-24 DIAGNOSIS — I10 Essential (primary) hypertension: Secondary | ICD-10-CM

## 2023-01-21 DIAGNOSIS — H902 Conductive hearing loss, unspecified: Secondary | ICD-10-CM | POA: Diagnosis not present

## 2023-01-21 DIAGNOSIS — H6123 Impacted cerumen, bilateral: Secondary | ICD-10-CM | POA: Diagnosis not present

## 2023-02-09 ENCOUNTER — Ambulatory Visit (INDEPENDENT_AMBULATORY_CARE_PROVIDER_SITE_OTHER): Payer: Federal, State, Local not specified - PPO

## 2023-02-09 DIAGNOSIS — Z23 Encounter for immunization: Secondary | ICD-10-CM | POA: Diagnosis not present

## 2023-02-09 NOTE — Progress Notes (Signed)
 Patient is in office today for a nurse visit for Immunization. Patient Injection was given in the  Right deltoid. Patient tolerated injection well.

## 2023-06-23 ENCOUNTER — Encounter (INDEPENDENT_AMBULATORY_CARE_PROVIDER_SITE_OTHER): Payer: Self-pay

## 2023-07-10 ENCOUNTER — Ambulatory Visit
Admission: RE | Admit: 2023-07-10 | Discharge: 2023-07-10 | Disposition: A | Discharge status: Home / Self Care | Payer: Self-pay | Source: Ambulatory Visit | Attending: Emergency Medicine | Admitting: Emergency Medicine

## 2023-07-10 VITALS — BP 112/72 | HR 64 | Temp 98.2°F | Ht 72.0 in | Wt 178.0 lb

## 2023-07-10 DIAGNOSIS — J069 Acute upper respiratory infection, unspecified: Secondary | ICD-10-CM | POA: Diagnosis not present

## 2023-07-10 MED ORDER — GUAIFENESIN-CODEINE 100-10 MG/5ML PO SOLN: 5.0000 mL | Freq: Every evening | ORAL | 0 refills | Status: DC | PRN

## 2023-07-10 MED ORDER — AMOXICILLIN-POT CLAVULANATE 875-125 MG PO TABS: 1.0000 | ORAL_TABLET | Freq: Two times a day (BID) | ORAL | 0 refills | Status: DC

## 2023-07-10 MED ORDER — BENZONATATE 100 MG PO CAPS: 100.0000 mg | ORAL_CAPSULE | Freq: Three times a day (TID) | ORAL | 0 refills | Status: DC

## 2023-07-10 NOTE — ED Triage Notes (Signed)
 Pt c/o cough and congestion x1week  Pt states that he has been using mucinex  and Theraflu to help relieve the cough.

## 2023-07-10 NOTE — ED Provider Notes (Signed)
 Roberto Walker    CSN: [id number] Arrival date & time: 07/10/23  0843      History   Chief Complaint Chief Complaint  Patient presents with   Cough    Congestion - Entered by patient    HPI Roberto Walker is a 63 y.o. male.   Patient presents for evaluation of nasal congestion, rhinorrhea, nonproductive cough, chest congestion present for 7 days.  The shaded sinus pressure resolved 4 days ago.  Has attempted use of TheraFlu and Mucinex .  Tolerating food and liquids.  No known sick contacts prior.  Denies fever, ear pain, sore throat, shortness of breath, wheezing.  Past Medical History:  Diagnosis Date   (HFpEF) heart failure with preserved ejection fraction (HCC)    a.) TTE 10/23/2020: EF 50-55%, mild LVH, mild LAE, mild-mod RAE, mild MR, G1DD.   Aneurysm of right iliac artery (HCC)    Atherosclerosis of aorta (HCC)    Bilateral carotid artery disease (HCC)    a.) carotid doppler 11/08/2020: 1-39% BICA   Coronary artery disease    a.) LHC/PCI 01/11/2012: 80% pRCA, 80% mRCA, 30% RPDA, 40% pLCx, 20% OM1, 100% mLAD (coronary thrombectomy + PTCA + 3.5 x 18 mm Xience DES x1), 20% D1   Erectile dysfunction    a.) on PDE5i (sildenafil ) PRN   Hypercalcemia    Hyperlipidemia    Hypertension    Left inguinal hernia    Long term current use of antithrombotics/antiplatelets    a.) DAPT (ASA + clopidogrel )   Metabolic syndrome    Microscopic hematuria    Osteoarthrosis    Peripheral vascular disease (HCC)    Prostate cancer (HCC)    RBBB (right bundle branch block)    Renal cell carcinoma (HCC)    a.) s/p partial RIGHT nephrectomy   Sleep apnea    a.) does not utilize nocturnal PAP therapy   ST elevation myocardial infarction (STEMI) of anterolateral wall (HCC) 01/11/2012   a.) PCI 100% mLAD --> coronary thrombectomy + PTCA + 3.5 x 18 mm Xience DES   Vitamin D  deficiency    Vitiligo     Patient Active Problem List   Diagnosis Date Noted   Left inguinal hernia  11/22/2021   Aneurysm of right iliac artery (HCC) 06/26/2021   Atherosclerosis of native arteries of extremity with intermittent claudication (HCC) 02/08/2021   History of colonic polyps    Polyp of colon    Atherosclerosis of aorta (HCC) 07/31/2020   Personal history of kidney cancer 02/06/2015   Benign essential HTN 08/02/2014   Coronary artery disease involving native coronary artery of native heart without angina pectoris 08/02/2014   Dyslipidemia 08/02/2014   H/O acute myocardial infarction 08/02/2014   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/02/2014   Dysmetabolic syndrome 08/02/2014   Arthritis, degenerative 08/02/2014   Peripheral vascular disease (HCC) 08/02/2014   Blood glucose elevated 08/02/2014   OSA (obstructive sleep apnea) 08/02/2014   Vitamin D  deficiency 08/02/2014   Vitiligo 08/02/2014    Past Surgical History:  Procedure Laterality Date   Beam Radiation     Prostate using Photons   COLONOSCOPY WITH PROPOFOL  N/A 09/18/2020   Procedure: COLONOSCOPY WITH PROPOFOL ;  Surgeon: Irby Mannan, MD;  Location: [facility] ENDOSCOPY;  Service: Endoscopy;  Laterality: N/A;   ILIAC VEIN ANGIOPLASTY / STENTING  2013   LEFT HEART CATH  01/11/2012   LOWER EXTREMITY ANGIOGRAPHY Left 10/18/2020   Procedure: LOWER EXTREMITY ANGIOGRAPHY;  Surgeon: Mikki Alexander  S, MD;  Location: [facility] INVASIVE CV LAB;  Service: Cardiovascular;  Laterality: Left;   PARTIAL NEPHRECTOMY Right 02/04/2008       Home Medications    Prior to Admission medications   Medication Sig Start Date End Date Taking? Authorizing Provider  amoxicillin-clavulanate (AUGMENTIN) 875-125 MG tablet Take 1 tablet by mouth every 12 (twelve) hours. 07/10/23  Yes Macy Lingenfelter R, NP  benzonatate (TESSALON) 100 MG capsule Take 1 capsule (100 mg total) by mouth every 8 (eight) hours. 07/10/23  Yes Eyleen Rawlinson R, NP  guaiFENesin -codeine 100-10 MG/5ML syrup Take 5 mLs by mouth at bedtime as needed for  cough. 07/10/23  Yes Wilder Kurowski R, NP  amLODipine -olmesartan  (AZOR ) 10-40 MG tablet TAKE 1 TABLET BY MOUTH DAILY 12/24/22  Yes Sowles, Krichna, MD  aspirin 81 MG tablet Take 1 tablet by mouth daily. 10/19/08  Yes [provider]  atorvastatin  (LIPITOR) 80 MG tablet Take 1 tablet (80 mg total) by mouth daily. Patient taking differently: Take 80 mg by mouth at bedtime. 01/20/18  Yes Sowles, Krichna, MD  carvedilol  (COREG ) 6.25 MG tablet Take 1 tablet (6.25 mg total) by mouth 2 (two) times daily. 10/24/22 01/22/23  Constancia Delton, MD  cholecalciferol (VITAMIN D ) 1000 UNITS tablet Take 1 tablet by mouth daily. 01/16/09  Yes [provider]  clopidogrel  (PLAVIX ) 75 MG tablet Take 1 tablet (75 mg total) by mouth daily. 01/20/18  Yes Sowles, Krichna, MD  nitroGLYCERIN  (NITROSTAT ) 0.4 MG SL tablet Place 1 tablet (0.4 mg total) under the tongue daily. 07/28/18  Yes Sowles, Krichna, MD  sildenafil  (VIAGRA ) 50 MG tablet Take 50 mg by mouth as needed. 12/22/19  Yes [provider]    Family History Family History  Problem Relation Age of Onset   Hypertension Mother    Lung cancer Father    Hypertension Father    Hyperlipidemia Father    Diabetes Sister        oldest sister   Hypertension Sister    Hypertension Sister     Social History Social History   Tobacco Use   Smoking status: Former    Current packs/day: 0.00    Average packs/day: 0.5 packs/day for 35.9 years (18.0 ttl pk-yrs)    Types: Cigarettes    Start date: 60    Quit date: 01/12/2012    Years since quitting: 11.4   Smokeless tobacco: Never  Vaping Use   Vaping status: Never Used  Substance Use Topics   Alcohol use: Yes    Alcohol/week: 2.0 standard drinks of alcohol    Types: 2 Shots of liquor per week    Comment: Minimal alcohol consumption   Drug use: No     Allergies   Lisinopril   Review of Systems Review of Systems   Physical Exam Triage Vital Signs ED Triage Vitals   Encounter Vitals Group     BP 07/10/23 0913 112/72     Systolic BP Percentile --      Diastolic BP Percentile --      Pulse Rate 07/10/23 0913 64     Resp --      Temp 07/10/23 0913 98.2 F (36.8 C)     Temp Source 07/10/23 0913 Oral     SpO2 07/10/23 0913 97 %     Weight 07/10/23 0914 178 lb (80.7 kg)     Height 07/10/23 0914 6' (1.829 m)     Head Circumference --      Peak Flow --  Pain Score 07/10/23 0914 0     Pain Loc --      Pain Education --      Exclude from Growth Chart --    No data found.  Updated Vital Signs BP 112/72 (BP Location: Left Arm)   Pulse 64   Temp 98.2 F (36.8 C) (Oral)   Ht 6' (1.829 m)   Wt 178 lb (80.7 kg)   SpO2 97%   BMI 24.14 kg/m   Visual Acuity Right Eye Distance:   Left Eye Distance:   Bilateral Distance:    Right Eye Near:   Left Eye Near:    Bilateral Near:     Physical Exam Constitutional:      Appearance: Normal appearance.  HENT:     Head: Normocephalic.     Right Ear: Tympanic membrane, ear canal and external ear normal.     Left Ear: Tympanic membrane, ear canal and external ear normal.     Nose: Congestion present.     Mouth/Throat:     Mouth: Mucous membranes are moist.     Pharynx: Oropharynx is clear. No oropharyngeal exudate or posterior oropharyngeal erythema.  Eyes:     Extraocular Movements: Extraocular movements intact.  Cardiovascular:     Rate and Rhythm: Normal rate and regular rhythm.     Pulses: Normal pulses.     Heart sounds: Normal heart sounds.  Pulmonary:     Effort: Pulmonary effort is normal.     Breath sounds: Normal breath sounds.  Musculoskeletal:     Cervical back: Normal range of motion and neck supple.  Neurological:     Mental Status: He is alert and oriented to person, place, and time.      UC Treatments / Results  Labs (all labs ordered are listed, but only abnormal results are displayed) Labs Reviewed - No data to display  EKG   Radiology No results  found.  Procedures Procedures (including critical care time)  Medications Ordered in UC Medications - No data to display  Initial Impression / Assessment and Plan / UC Course  I have reviewed the triage vital signs and the nursing notes.  Pertinent labs & imaging results that were available during my care of the patient were reviewed by me and considered in my medical decision making (see chart for details).  Acute URI  Patient is in no signs of distress nor toxic appearing.  Vital signs are stable.  Low suspicion for pneumonia, pneumothorax or bronchitis and therefore will defer imaging.  Will empirically place on antibiotics if symptoms are continuing to persist without signs of resolution, prescribed Augmentin, for cough prescribed Tessalon and guaifenesin  codeine, PDMP reviewed, low risk.May use additional over-the-counter medications as needed for supportive care.  May follow-up with urgent care as needed if symptoms persist or worsen.   Final Clinical Impressions(s) / UC Diagnoses   Final diagnoses:  Acute URI   Discharge Instructions      Begin Augmentin twice daily for 7 days  May use Tessalon Perles every 8 hours as needed for cough, may use cough syrup at bedtime to help you rest    You can take Tylenol  and/or Ibuprofen as needed for fever reduction and pain relief.   For cough: honey 1/2 to 1 teaspoon (you can dilute the honey in water or another fluid).  You can also use guaifenesin  and dextromethorphan for cough. You can use a humidifier for chest congestion and cough.  If you don't have a humidifier,  you can sit in the bathroom with the hot shower running.      For sore throat: try warm salt water gargles, cepacol lozenges, throat spray, warm tea or water with lemon/honey, popsicles or ice, or OTC cold relief medicine for throat discomfort.   For congestion: take a daily anti-histamine like Zyrtec, Claritin, and a oral decongestant, such as pseudoephedrine.  You can  also use Flonase 1-2 sprays in each nostril daily.   It is important to stay hydrated: drink plenty of fluids (water, gatorade/powerade/pedialyte, juices, or teas) to keep your throat moisturized and help further relieve irritation/discomfort.   ED Prescriptions     Medication Sig Dispense Auth. Provider   amoxicillin-clavulanate (AUGMENTIN) 875-125 MG tablet Take 1 tablet by mouth every 12 (twelve) hours. 14 tablet Adelee Hannula R, NP   benzonatate (TESSALON) 100 MG capsule Take 1 capsule (100 mg total) by mouth every 8 (eight) hours. 21 capsule Jefferey Lippmann R, NP   guaiFENesin -codeine 100-10 MG/5ML syrup Take 5 mLs by mouth at bedtime as needed for cough. 120 mL Tome Wilson, Maybelle Spatz, NP      I have reviewed the PDMP during this encounter.   Reena Canning, [place] 07/10/23 [phone]

## 2023-07-10 NOTE — Discharge Instructions (Signed)
 Begin Augmentin twice daily for 7 days  May use Tessalon Perles every 8 hours as needed for cough, may use cough syrup at bedtime to help you rest    You can take Tylenol  and/or Ibuprofen as needed for fever reduction and pain relief.   For cough: honey 1/2 to 1 teaspoon (you can dilute the honey in water or another fluid).  You can also use guaifenesin  and dextromethorphan for cough. You can use a humidifier for chest congestion and cough.  If you don't have a humidifier, you can sit in the bathroom with the hot shower running.      For sore throat: try warm salt water gargles, cepacol lozenges, throat spray, warm tea or water with lemon/honey, popsicles or ice, or OTC cold relief medicine for throat discomfort.   For congestion: take a daily anti-histamine like Zyrtec, Claritin, and a oral decongestant, such as pseudoephedrine.  You can also use Flonase 1-2 sprays in each nostril daily.   It is important to stay hydrated: drink plenty of fluids (water, gatorade/powerade/pedialyte, juices, or teas) to keep your throat moisturized and help further relieve irritation/discomfort.

## 2023-07-22 DIAGNOSIS — H6123 Impacted cerumen, bilateral: Secondary | ICD-10-CM | POA: Diagnosis not present

## 2023-07-22 DIAGNOSIS — H903 Sensorineural hearing loss, bilateral: Secondary | ICD-10-CM | POA: Diagnosis not present

## 2023-07-29 ENCOUNTER — Ambulatory Visit: Attending: Cardiology | Admitting: Cardiology

## 2023-07-29 VITALS — BP 120/82 | HR 59 | Ht 72.0 in | Wt 180.0 lb

## 2023-07-29 DIAGNOSIS — I10 Essential (primary) hypertension: Secondary | ICD-10-CM

## 2023-07-29 DIAGNOSIS — E7800 Pure hypercholesterolemia, unspecified: Secondary | ICD-10-CM

## 2023-07-29 DIAGNOSIS — I2510 Atherosclerotic heart disease of native coronary artery without angina pectoris: Secondary | ICD-10-CM

## 2023-07-29 NOTE — Progress Notes (Signed)
 Cardiology Office Note:    Date:  07/29/2023   ID:  Roberto Walker, DOB 1960-11-15, MRN [id number]  PCP:  Sowles, Krichna, MD   [facility] Providers Cardiologist:  Redell Cave, MD     Referring MD: Glenard Mire, MD   Chief Complaint  Patient presents with   Follow-up    History of Present Illness:    Roberto Walker is a 63 y.o. male with a hx of CAD/MI (s/p PCI to LAD 2013, RCA 80%)hypertension, hyperlipidemia, PAD (s/p PCI 2015, right common iliac artery aneurysm), former smoker x20+ years who presents for follow-up.   Denies chest pain or shortness of breath.  Tolerating medications as prescribed, no adverse effects.  Has no concerns at this time.  Prior notes Patient previously followed at [facility] for cardiac care.  Has a history of MI, underwent drug-eluting stent to mid LAD 2013.   Patient has a history of hematuria, underwent an abdominal CT 09/26/2019 showing aortic atherosclerosis, finding of aneurysmal dilation of the right common iliac artery up to 1.7 cm noted.  He has a history of right iliac artery aneurysm, being followed by vascular surgery.  Past Medical History:  Diagnosis Date   (HFpEF) heart failure with preserved ejection fraction (HCC)    a.) TTE 10/23/2020: EF 50-55%, mild LVH, mild LAE, mild-mod RAE, mild MR, G1DD.   Aneurysm of right iliac artery (HCC)    Atherosclerosis of aorta (HCC)    Bilateral carotid artery disease (HCC)    a.) carotid doppler 11/08/2020: 1-39% BICA   Coronary artery disease    a.) LHC/PCI 01/11/2012: 80% pRCA, 80% mRCA, 30% RPDA, 40% pLCx, 20% OM1, 100% mLAD (coronary thrombectomy + PTCA + 3.5 x 18 mm Xience DES x1), 20% D1   Erectile dysfunction    a.) on PDE5i (sildenafil ) PRN   Hypercalcemia    Hyperlipidemia    Hypertension    Left inguinal hernia    Long term current use of antithrombotics/antiplatelets    a.) DAPT (ASA + clopidogrel )   Metabolic syndrome    Microscopic hematuria    Osteoarthrosis     Peripheral vascular disease (HCC)    Prostate cancer (HCC)    RBBB (right bundle branch block)    Renal cell carcinoma (HCC)    a.) s/p partial RIGHT nephrectomy   Sleep apnea    a.) does not utilize nocturnal PAP therapy   ST elevation myocardial infarction (STEMI) of anterolateral wall (HCC) 01/11/2012   a.) PCI 100% mLAD --> coronary thrombectomy + PTCA + 3.5 x 18 mm Xience DES   Vitamin D  deficiency    Vitiligo     Past Surgical History:  Procedure Laterality Date   Beam Radiation     Prostate using Photons   COLONOSCOPY WITH PROPOFOL  N/A 09/18/2020   Procedure: COLONOSCOPY WITH PROPOFOL ;  Surgeon: Janalyn Keene NOVAK, MD;  Location: [facility] ENDOSCOPY;  Service: Endoscopy;  Laterality: N/A;   ILIAC VEIN ANGIOPLASTY / STENTING  2013   LEFT HEART CATH  01/11/2012   LOWER EXTREMITY ANGIOGRAPHY Left 10/18/2020   Procedure: LOWER EXTREMITY ANGIOGRAPHY;  Surgeon: Marea Selinda RAMAN, MD;  Location: [facility] INVASIVE CV LAB;  Service: Cardiovascular;  Laterality: Left;   PARTIAL NEPHRECTOMY Right 02/04/2008    Current Medications: Current Meds  Medication Sig   amLODipine -olmesartan  (AZOR ) 10-40 MG tablet TAKE 1 TABLET BY MOUTH DAILY   aspirin 81 MG tablet Take 1 tablet by mouth daily.   atorvastatin  (LIPITOR) 80 MG tablet Take 1 tablet (  80 mg total) by mouth daily.   carvedilol  (COREG ) 6.25 MG tablet Take 1 tablet (6.25 mg total) by mouth 2 (two) times daily.   cholecalciferol (VITAMIN D ) 1000 UNITS tablet Take 1 tablet by mouth daily.   clopidogrel  (PLAVIX ) 75 MG tablet Take 1 tablet (75 mg total) by mouth daily.   nitroGLYCERIN  (NITROSTAT ) 0.4 MG SL tablet Place 1 tablet (0.4 mg total) under the tongue daily.   sildenafil  (VIAGRA ) 50 MG tablet Take 50 mg by mouth as needed.     Allergies:   Lisinopril   Social History   Socioeconomic History   Marital status: Married    Spouse name: Berwyn   Number of children: 2   Years of education: College    Highest education level: Not on  file  Occupational History   Occupation: RDU  Tobacco Use   Smoking status: Former    Current packs/day: 0.00    Average packs/day: 0.5 packs/day for 35.9 years (18.0 ttl pk-yrs)    Types: Cigarettes    Start date: 61    Quit date: 01/12/2012    Years since quitting: 11.5   Smokeless tobacco: Never  Vaping Use   Vaping status: Never Used  Substance and Sexual Activity   Alcohol use: Yes    Alcohol/week: 2.0 standard drinks of alcohol    Types: 2 Shots of liquor per week    Comment: Minimal alcohol consumption   Drug use: No   Sexual activity: Yes    Partners: Female  Other Topics Concern   Not on file  Social History Narrative   Not on file   Social Drivers of Health   Financial Resource Strain: Low Risk  (11/22/2021)   Overall Financial Resource Strain (CARDIA)    Difficulty of Paying Living Expenses: Not hard at all  Food Insecurity: No Food Insecurity (11/22/2021)   Hunger Vital Sign    Worried About Running Out of Food in the Last Year: Never true    Ran Out of Food in the Last Year: Never true  Transportation Needs: No Transportation Needs (11/22/2021)   PRAPARE - Administrator, Civil Service (Medical): No    Lack of Transportation (Non-Medical): No  Physical Activity: Sufficiently Active (11/22/2021)   Exercise Vital Sign    Days of Exercise per Week: 5 days    Minutes of Exercise per Session: 60 min  Stress: No Stress Concern Present (11/22/2021)   [employer] of Occupational Health - Occupational Stress Questionnaire    Feeling of Stress : Only a little  Social Connections: Moderately Integrated (11/22/2021)   Social Connection and Isolation Panel    Frequency of Communication with Friends and Family: More than three times a week    Frequency of Social Gatherings with Friends and Family: More than three times a week    Attends Religious Services: 1 to 4 times per year    Active Member of [employer] or Organizations: No    Attends Tax inspector Meetings: Never    Marital Status: Married     Family History: The patient's family history includes Diabetes in his sister; Hyperlipidemia in his father; Hypertension in his father, mother, sister, and sister; Lung cancer in his father.  ROS:   Please see the history of present illness.     All other systems reviewed and are negative.  EKGs/Labs/Other Studies Reviewed:    The following studies were reviewed today:   EKG Interpretation Date/Time:  Wednesday July 29 2023 11:42:38  EDT Ventricular Rate:  59 PR Interval:  136 QRS Duration:  150 QT Interval:  440 QTC Calculation: 435 R Axis:   -20  Text Interpretation: Sinus bradycardia with sinus arrhythmia Right bundle branch block Confirmed by Darliss Rogue ([id number]) on 07/29/2023 11:51:46 AM    Recent Labs: 09/25/2022: ALT 16; BUN 17; Creat 1.01; Hemoglobin 13.2; Platelets 260; Potassium 4.2; Sodium 140  Recent Lipid Panel    Component Value Date/Time   CHOL 126 09/25/2022 0924   CHOL 134 02/16/2015 0820   CHOL 118 05/02/2011 0943   TRIG 44 09/25/2022 0924   TRIG 39 05/02/2011 0943   HDL 72 09/25/2022 0924   HDL 48 02/16/2015 0820   HDL 52 05/02/2011 0943   CHOLHDL 1.8 09/25/2022 0924   VLDL 9 06/06/2016 0804   VLDL 8 05/02/2011 0943   LDLCALC 41 09/25/2022 0924   LDLCALC 58 05/02/2011 0943     Risk Assessment/Calculations:         Physical Exam:    VS:  BP 120/82 (BP Location: Left Arm, Patient Position: Sitting, Cuff Size: Normal)   Pulse (!) 59   Ht 6' (1.829 m)   Wt 180 lb (81.6 kg)   SpO2 95%   BMI 24.41 kg/m     Wt Readings from Last 3 Encounters:  07/29/23 180 lb (81.6 kg)  07/10/23 178 lb (80.7 kg)  10/07/22 177 lb 9.6 oz (80.6 kg)     GEN:  Well nourished, well developed in no acute distress HEENT: Normal NECK: No JVD; No carotid bruits CARDIAC: RRR, no murmurs, rubs, gallops RESPIRATORY:  Clear to auscultation without rales, wheezing or rhonchi  ABDOMEN: Soft,  non-tender, non-distended MUSCULOSKELETAL:  No edema; No deformity  SKIN: Warm and dry NEUROLOGIC:  Alert and oriented x 3 PSYCHIATRIC:  Normal affect   ASSESSMENT:    1. Coronary artery disease involving native coronary artery of native heart without angina pectoris   2. Primary hypertension   3. Pure hypercholesterolemia    PLAN:    In order of problems listed above:  CAD/MI 2013 PCI to mid LAD.  80% mid RCA.  Denies chest pain.  Continue aspirin, Plavix , Lipitor.  Hypertension, BP controlled.  Heart rate improved with reducing Coreg .  Current heart rate 59.  Continue Coreg  6.25 mg twice daily, amlodipine -olmesartan  10-40 mg daily. Hyperlipidemia, cholesterol controlled.  Continue Lipitor 80 mg daily.  Follow-up in 1 year.  Medication Adjustments/Labs and Tests Ordered: Current medicines are reviewed at length with the patient today.  Concerns regarding medicines are outlined above.  Orders Placed This Encounter  Procedures   EKG 12-Lead    No orders of the defined types were placed in this encounter.    Patient Instructions  Medication Instructions:  Your physician recommends that you continue on your current medications as directed. Please refer to the Current Medication list given to you today.   *If you need a refill on your cardiac medications before your next appointment, please call your pharmacy*  Lab Work: No labs ordered today  If you have labs (blood work) drawn today and your tests are completely normal, you will receive your results only by: MyChart Message (if you have MyChart) OR A paper copy in the mail If you have any lab test that is abnormal or we need to change your treatment, we will call you to review the results.  Testing/Procedures: No test ordered today   Follow-Up: At [facility], you and your health needs are our priority.  As part of our continuing mission to provide you with exceptional heart care, our providers are all part of  one team.  This team includes your primary Cardiologist (physician) and Advanced Practice Providers or APPs (Physician Assistants and Nurse Practitioners) who all work together to provide you with the care you need, when you need it.  Your next appointment:   1 year(s)  Provider:   You may see Redell Cave, MD or one of the following Advanced Practice Providers on your designated Care Team:   Lonni Meager, NP Lesley Maffucci, PA-C Bernardino Bring, PA-C Cadence [place], PA-C Tylene Lunch, NP Barnie Hila, NP    We recommend signing up for the patient portal called MyChart.  Sign up information is provided on this After Visit Summary.  MyChart is used to connect with patients for Virtual Visits (Telemedicine).  Patients are able to view lab/test results, encounter notes, upcoming appointments, etc.  Non-urgent messages can be sent to your provider as well.   To learn more about what you can do with MyChart, go to [email or web address].         Signed, Redell Cave, MD  07/29/2023 12:28 PM    [facility]

## 2023-07-29 NOTE — Patient Instructions (Signed)

## 2023-09-09 ENCOUNTER — Other Ambulatory Visit (INDEPENDENT_AMBULATORY_CARE_PROVIDER_SITE_OTHER): Payer: Self-pay | Admitting: Nurse Practitioner

## 2023-09-09 DIAGNOSIS — I739 Peripheral vascular disease, unspecified: Secondary | ICD-10-CM

## 2023-09-10 ENCOUNTER — Other Ambulatory Visit (INDEPENDENT_AMBULATORY_CARE_PROVIDER_SITE_OTHER): Payer: Federal, State, Local not specified - PPO

## 2023-09-10 ENCOUNTER — Encounter (INDEPENDENT_AMBULATORY_CARE_PROVIDER_SITE_OTHER): Payer: Self-pay | Admitting: Nurse Practitioner

## 2023-09-10 ENCOUNTER — Ambulatory Visit (INDEPENDENT_AMBULATORY_CARE_PROVIDER_SITE_OTHER): Payer: Federal, State, Local not specified - PPO | Admitting: Nurse Practitioner

## 2023-09-10 VITALS — BP 147/95 | HR 60 | Resp 18 | Ht 72.0 in | Wt 180.0 lb

## 2023-09-10 DIAGNOSIS — I739 Peripheral vascular disease, unspecified: Secondary | ICD-10-CM

## 2023-09-10 DIAGNOSIS — I723 Aneurysm of iliac artery: Secondary | ICD-10-CM | POA: Diagnosis not present

## 2023-09-10 DIAGNOSIS — Z9889 Other specified postprocedural states: Secondary | ICD-10-CM

## 2023-09-10 DIAGNOSIS — I10 Essential (primary) hypertension: Secondary | ICD-10-CM | POA: Diagnosis not present

## 2023-09-13 ENCOUNTER — Encounter (INDEPENDENT_AMBULATORY_CARE_PROVIDER_SITE_OTHER): Payer: Self-pay | Admitting: Nurse Practitioner

## 2023-09-13 NOTE — Progress Notes (Signed)
 Subjective:    Patient ID: Roberto Walker, male    DOB: [birth year], 63 y.o.   MRN: [id number] Chief Complaint  Patient presents with   Follow-up    1 year follow up ABI    The patient returns to the office for surveillance of a known right common iliac artery aneurysm.  He was seen earlier this year for his peripheral arterial disease which is stable.  Patient denies abdominal pain or back pain, no other abdominal complaints. No changes suggesting embolic episodes.   There have been no interval changes in the patient's overall health care since his last visit.  Patient denies amaurosis fugax or TIA symptoms. There is no history of claudication or rest pain symptoms of the lower extremities. The patient denies angina or shortness of breath.   Duplex US  of the aorta and iliac arteries shows an an abdominal aorta 2.90 cm.  A right common iliac artery aneurysm of 1.7 cm and a right common iliac artery aneurysm of 1.3 cm.  Ultimately the aorta became larger but the iliac arteries decreased in size  He has an ABI of 1.11 on the right and 1.6 on the left with good triphasic tibial artery waveforms and normal toe waveforms bilaterally    Review of Systems  All other systems reviewed and are negative.      Objective:   Physical Exam Vitals reviewed.  HENT:     Head: Normocephalic.  Cardiovascular:     Rate and Rhythm: Normal rate.     Pulses:          Dorsalis pedis pulses are detected w/ Doppler on the right side and detected w/ Doppler on the left side.       Posterior tibial pulses are detected w/ Doppler on the right side and detected w/ Doppler on the left side.  Pulmonary:     Effort: Pulmonary effort is normal.  Skin:    General: Skin is warm and dry.  Neurological:     Mental Status: He is alert and oriented to person, place, and time.  Psychiatric:        Mood and Affect: Mood normal.        Behavior: Behavior normal.        Thought Content: Thought content normal.         Judgment: Judgment normal.     BP (!) 147/95 (BP Location: Right Arm, Patient Position: Sitting, Cuff Size: Normal)   Pulse 60   Resp 18   Ht 6' (1.829 m)   Wt 180 lb (81.6 kg)   BMI 24.41 kg/m   Past Medical History:  Diagnosis Date   (HFpEF) heart failure with preserved ejection fraction (HCC)    a.) TTE 10/23/2020: EF 50-55%, mild LVH, mild LAE, mild-mod RAE, mild MR, G1DD.   Aneurysm of right iliac artery (HCC)    Atherosclerosis of aorta (HCC)    Bilateral carotid artery disease (HCC)    a.) carotid doppler 11/08/2020: 1-39% BICA   Coronary artery disease    a.) LHC/PCI 01/11/2012: 80% pRCA, 80% mRCA, 30% RPDA, 40% pLCx, 20% OM1, 100% mLAD (coronary thrombectomy + PTCA + 3.5 x 18 mm Xience DES x1), 20% D1   Erectile dysfunction    a.) on PDE5i (sildenafil ) PRN   Hypercalcemia    Hyperlipidemia    Hypertension    Left inguinal hernia    Long term current use of antithrombotics/antiplatelets    a.) DAPT (ASA + clopidogrel )   Metabolic  syndrome    Microscopic hematuria    Osteoarthrosis    Peripheral vascular disease (HCC)    Prostate cancer (HCC)    RBBB (right bundle branch block)    Renal cell carcinoma (HCC)    a.) s/p partial RIGHT nephrectomy   Sleep apnea    a.) does not utilize nocturnal PAP therapy   ST elevation myocardial infarction (STEMI) of anterolateral wall (HCC) 01/11/2012   a.) PCI 100% mLAD --> coronary thrombectomy + PTCA + 3.5 x 18 mm Xience DES   Vitamin D  deficiency    Vitiligo     Social History   Socioeconomic History   Marital status: Married    Spouse name: Berwyn   Number of children: 2   Years of education: College    Highest education level: Not on file  Occupational History   Occupation: RDU  Tobacco Use   Smoking status: Former    Current packs/day: 0.00    Average packs/day: 0.5 packs/day for 35.9 years (18.0 ttl pk-yrs)    Types: Cigarettes    Start date: 71    Quit date: 01/12/2012    Years since quitting: 11.6    Smokeless tobacco: Never  Vaping Use   Vaping status: Never Used  Substance and Sexual Activity   Alcohol use: Yes    Alcohol/week: 2.0 standard drinks of alcohol    Types: 2 Shots of liquor per week    Comment: Minimal alcohol consumption   Drug use: No   Sexual activity: Yes    Partners: Female  Other Topics Concern   Not on file  Social History Narrative   Not on file   Social Drivers of Health   Financial Resource Strain: Low Risk  (11/22/2021)   Overall Financial Resource Strain (CARDIA)    Difficulty of Paying Living Expenses: Not hard at all  Food Insecurity: No Food Insecurity (11/22/2021)   Hunger Vital Sign    Worried About Running Out of Food in the Last Year: Never true    Ran Out of Food in the Last Year: Never true  Transportation Needs: No Transportation Needs (11/22/2021)   PRAPARE - Administrator, Civil Service (Medical): No    Lack of Transportation (Non-Medical): No  Physical Activity: Sufficiently Active (11/22/2021)   Exercise Vital Sign    Days of Exercise per Week: 5 days    Minutes of Exercise per Session: 60 min  Stress: No Stress Concern Present (11/22/2021)   [employer] of Occupational Health - Occupational Stress Questionnaire    Feeling of Stress : Only a little  Social Connections: Moderately Integrated (11/22/2021)   Social Connection and Isolation Panel    Frequency of Communication with Friends and Family: More than three times a week    Frequency of Social Gatherings with Friends and Family: More than three times a week    Attends Religious Services: 1 to 4 times per year    Active Member of [employer] or Organizations: No    Attends Banker Meetings: Never    Marital Status: Married  Catering manager Violence: Not At Risk (11/22/2021)   Humiliation, Afraid, Rape, and Kick questionnaire    Fear of Current or Ex-Partner: No    Emotionally Abused: No    Physically Abused: No    Sexually Abused: No     Past Surgical History:  Procedure Laterality Date   Beam Radiation     Prostate using Photons   COLONOSCOPY WITH PROPOFOL  N/A 09/18/2020  Procedure: COLONOSCOPY WITH PROPOFOL ;  Surgeon: Janalyn Keene NOVAK, MD;  Location: [facility];  Service: Endoscopy;  Laterality: N/A;   ILIAC VEIN ANGIOPLASTY / STENTING  2013   LEFT HEART CATH  01/11/2012   LOWER EXTREMITY ANGIOGRAPHY Left 10/18/2020   Procedure: LOWER EXTREMITY ANGIOGRAPHY;  Surgeon: Marea Selinda RAMAN, MD;  Location: [facility];  Service: Cardiovascular;  Laterality: Left;   PARTIAL NEPHRECTOMY Right 02/04/2008    Family History  Problem Relation Age of Onset   Hypertension Mother    Lung cancer Father    Hypertension Father    Hyperlipidemia Father    Diabetes Sister        oldest sister   Hypertension Sister    Hypertension Sister     Allergies  Allergen Reactions   Lisinopril Swelling    Swelling around mouth, lips       Latest Ref Rng & Units 09/25/2022    9:24 AM 03/13/2022   11:05 AM 07/18/2021    8:07 AM  CBC  WBC 3.8 - 10.8 Thousand/uL 6.9  6.4  6.8   Hemoglobin 13.2 - 17.1 g/dL 86.7  86.5  85.9   Hematocrit 38.5 - 50.0 % 39.6  39.7  40.2   Platelets 140 - 400 Thousand/uL 260  276  258       CMP     Component Value Date/Time   NA 140 09/25/2022 0924   NA 141 08/02/2015 0853   NA 139 05/17/2013 0447   K 4.2 09/25/2022 0924   K 4.0 05/17/2013 0447   CL 110 09/25/2022 0924   CL 109 (H) 05/17/2013 0447   CO2 22 09/25/2022 0924   CO2 23 05/17/2013 0447   GLUCOSE 105 (H) 09/25/2022 0924   GLUCOSE 150 (H) 05/17/2013 0447   BUN 17 09/25/2022 0924   BUN 15 08/02/2015 0853   BUN 19 (H) 05/17/2013 0447   CREATININE 1.01 09/25/2022 0924   CALCIUM  10.0 09/25/2022 0924   CALCIUM  9.1 05/17/2013 0447   PROT 7.0 09/25/2022 0924   PROT 7.2 08/02/2015 0853   PROT 7.5 05/02/2011 0943   ALBUMIN 3.9 03/13/2022 1105   ALBUMIN 4.2 08/02/2015 0853   ALBUMIN 3.9 05/02/2011 0943   AST 19  09/25/2022 0924   AST 31 05/02/2011 0943   ALT 16 09/25/2022 0924   ALT 37 05/02/2011 0943   ALKPHOS 58 03/13/2022 1105   ALKPHOS 54 05/02/2011 0943   BILITOT 0.4 09/25/2022 0924   BILITOT 0.3 08/02/2015 0853   BILITOT 0.5 05/02/2011 0943   EGFR 84 09/25/2022 0924   GFRNONAA >60 03/13/2022 1105   GFRNONAA 78 07/31/2020 1050     VAS US  ABI WITH/WO TBI  Result Date: 09/12/2022  LOWER EXTREMITY DOPPLER STUDY Patient Name:  Roberto Walker  Date of Exam:   09/10/2022 Medical Rec #: [id number]      Accession #:    [id number] Date of Birth: [birth year]     Patient Gender: M Patient Age:   79 years Exam Location:  [facility] Procedure:      VAS US  ABI WITH/WO TBI Referring Phys: --------------------------------------------------------------------------------  Indications: Peripheral artery disease.  Vascular Interventions: 2012: Left EIA stent;                         05/16/2013: Left CIA stent with left EIA, popliteal, TP  trunk, posterior tibial & anterior tibial artery PTAs;                         10/18/2020: Left EIA stent;. Performing Technologist: Jerel Croak RVT  Examination Guidelines: A complete evaluation includes at minimum, Doppler waveform signals and systolic blood pressure reading at the level of bilateral brachial, anterior tibial, and posterior tibial arteries, when vessel segments are accessible. Bilateral testing is considered an integral part of a complete examination. Photoelectric Plethysmograph (PPG) waveforms and toe systolic pressure readings are included as required and additional duplex testing as needed. Limited examinations for reoccurring indications may be performed as noted.  ABI Findings: +---------+------------------+-----+---------+--------+ Right    Rt Pressure (mmHg)IndexWaveform Comment  +---------+------------------+-----+---------+--------+ Brachial 111                                       +---------+------------------+-----+---------+--------+ ATA      127               1.13 triphasic         +---------+------------------+-----+---------+--------+ PTA      135               1.21 triphasic         +---------+------------------+-----+---------+--------+ Great Toe129               1.15 Normal            +---------+------------------+-----+---------+--------+ +---------+------------------+-----+---------+-------+ Left     Lt Pressure (mmHg)IndexWaveform Comment +---------+------------------+-----+---------+-------+ Brachial 112                                     +---------+------------------+-----+---------+-------+ ATA      128               1.14 triphasic        +---------+------------------+-----+---------+-------+ PTA      130               1.16 triphasic        +---------+------------------+-----+---------+-------+ Great Toe130               1.16 Normal           +---------+------------------+-----+---------+-------+ +-------+-----------+-----------+------------+------------+ ABI/TBIToday's ABIToday's TBIPrevious ABIPrevious TBI +-------+-----------+-----------+------------+------------+ Right  1.21       1.15       1.41        .95          +-------+-----------+-----------+------------+------------+ Left   1.16       1.16       1.14        .73          +-------+-----------+-----------+------------+------------+ Bilateral ABIs and TBIs appear essentially unchanged compared to prior study on 08/2021.  Summary: Right: Resting right ankle-brachial index is within normal range. The right toe-brachial index is normal. Left: Resting left ankle-brachial index is within normal range. The left toe-brachial index is normal. *See table(s) above for measurements and observations.  Electronically signed by Selinda Gu MD on 09/12/2022 at 12:04:59 PM.    Final        Assessment & Plan:   1. Peripheral arterial disease with history of  revascularization [facility]) Patient recently had noninvasive studies about a month ago which were stable.  Patient will follow-up annually for this.  2. Aneurysm of iliac artery (HCC) We  will continue to follow these at his next follow-up.  We discussed the natural progression of aneurysms as well as the typical repair for this.  Patient is advised to contact our office if there is embolism or occlusion.  In addition we will follow his aortic size as well. 3. Benign essential HTN Continue antihypertensive medications as already ordered, these medications have been reviewed and there are no changes at this time.   Current Outpatient Medications on File Prior to Visit  Medication Sig Dispense Refill   amLODipine -olmesartan  (AZOR ) 10-40 MG tablet TAKE 1 TABLET BY MOUTH DAILY 90 tablet 3   aspirin 81 MG tablet Take 1 tablet by mouth daily.     atorvastatin  (LIPITOR) 80 MG tablet Take 1 tablet (80 mg total) by mouth daily. 90 tablet 1   carvedilol  (COREG ) 6.25 MG tablet Take 1 tablet (6.25 mg total) by mouth 2 (two) times daily. 180 tablet 0   cholecalciferol (VITAMIN D ) 1000 UNITS tablet Take 1 tablet by mouth daily.     clopidogrel  (PLAVIX ) 75 MG tablet Take 1 tablet (75 mg total) by mouth daily. 90 tablet 1   nitroGLYCERIN  (NITROSTAT ) 0.4 MG SL tablet Place 1 tablet (0.4 mg total) under the tongue daily. 30 tablet 0   sildenafil  (VIAGRA ) 50 MG tablet Take 50 mg by mouth as needed.     No current facility-administered medications on file prior to visit.    There are no Patient Instructions on file for this visit. No follow-ups on file.   Ellee Wawrzyniak E Abdulloh Ullom, NP

## 2023-09-14 LAB — VAS US ABI WITH/WO TBI
Left ABI: 1.06
Right ABI: 1.11

## 2023-09-25 ENCOUNTER — Encounter: Payer: Self-pay | Admitting: Family Medicine

## 2023-09-25 ENCOUNTER — Ambulatory Visit (INDEPENDENT_AMBULATORY_CARE_PROVIDER_SITE_OTHER): Payer: Federal, State, Local not specified - PPO | Admitting: Family Medicine

## 2023-09-25 VITALS — BP 128/84 | HR 78 | Resp 16 | Ht 72.0 in | Wt 177.3 lb

## 2023-09-25 DIAGNOSIS — Z8546 Personal history of malignant neoplasm of prostate: Secondary | ICD-10-CM | POA: Insufficient documentation

## 2023-09-25 DIAGNOSIS — I723 Aneurysm of iliac artery: Secondary | ICD-10-CM | POA: Diagnosis not present

## 2023-09-25 DIAGNOSIS — I252 Old myocardial infarction: Secondary | ICD-10-CM | POA: Diagnosis not present

## 2023-09-25 DIAGNOSIS — I700 Atherosclerosis of aorta: Secondary | ICD-10-CM

## 2023-09-25 DIAGNOSIS — N529 Male erectile dysfunction, unspecified: Secondary | ICD-10-CM

## 2023-09-25 DIAGNOSIS — I10 Essential (primary) hypertension: Secondary | ICD-10-CM

## 2023-09-25 DIAGNOSIS — G4733 Obstructive sleep apnea (adult) (pediatric): Secondary | ICD-10-CM

## 2023-09-25 DIAGNOSIS — E8881 Metabolic syndrome: Secondary | ICD-10-CM

## 2023-09-25 DIAGNOSIS — Z85528 Personal history of other malignant neoplasm of kidney: Secondary | ICD-10-CM

## 2023-09-25 DIAGNOSIS — I739 Peripheral vascular disease, unspecified: Secondary | ICD-10-CM

## 2023-09-25 MED ORDER — NITROGLYCERIN 0.4 MG SL SUBL: 0.4000 mg | SUBLINGUAL_TABLET | Freq: Every day | SUBLINGUAL | 1 refills | Status: DC

## 2023-09-25 NOTE — Progress Notes (Signed)
 Name: Roberto Walker   MRN: [id number]    DOB: [birth year]   Date:09/25/2023       Progress Note  Subjective  Chief Complaint  Chief Complaint  Patient presents with   Medical Management of Chronic Issues   Discussed the use of AI scribe software for clinical note transcription with the patient, who gave verbal consent to proceed.  History of Present Illness Roberto Walker is a 63 year old male with peripheral vascular disease and a right iliac artery aneurysm who presents for a follow-up visit.  He has a history of peripheral vascular disease and a right iliac artery aneurysm, initially discovered during a CT scan in 2021. He follows up with a vascular specialist and had an ultrasound in August 2025, which showed minor changes in the aneurysm size ( per patient ). . He is on atorvastatin  80 mg, carvedilol , aspirin and Plavix .  He experienced a myocardial infarction in October 2013 and underwent PCI with stent placement in the LAD He follows up with a cardiologist and reports good blood pressure control, with recent readings of 120/82 mmHg. He takes carvedilol  6.25 mg, amlodipine  olmasartan 10/40 mg , statin therapy , plavis and a daily baby aspirin. No chest pain or shortness of breath, and he has not used nitroglycerin  recently.  He has a personal history of kidney cancer diagnosed in 2010, for which he no longer sees a specialist but monitors with urinalysis. He reports trace blood in urine on tests but does not see visible blood.  He was diagnosed with prostate cancer in December 2014 and underwent radiation therapy in 2015 at [facility]. He continues to monitor his PSA levels annually.  He has a history of prediabetes with a slightly elevated A1c. He maintains a diet low in carbohydrates and red meat, focusing on fish and seafood, and remains physically active.  He has a history of obstructive sleep apnea but does not use a CPAP machine due to intolerance.  He is retired, enjoys gardening, and  travels frequently. He reports making healthy dietary choices even while traveling.    Patient Active Problem List   Diagnosis Date Noted   History of prostate cancer 09/25/2023   Left inguinal hernia 11/22/2021   Aneurysm of right iliac artery (HCC) 06/26/2021   Atherosclerosis of native arteries of extremity with intermittent claudication (HCC) 02/08/2021   History of colonic polyps    Polyp of colon    Atherosclerosis of aorta (HCC) 07/31/2020   Personal history of kidney cancer 02/06/2015   Benign essential HTN 08/02/2014   Coronary artery disease involving native coronary artery of native heart without angina pectoris 08/02/2014   Dyslipidemia 08/02/2014   History of ST elevation myocardial infarction (STEMI) 08/02/2014   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/02/2014   Dysmetabolic syndrome 08/02/2014   Arthritis, degenerative 08/02/2014   Peripheral vascular disease (HCC) 08/02/2014   Blood glucose elevated 08/02/2014   OSA (obstructive sleep apnea) 08/02/2014   Vitamin D  deficiency 08/02/2014   Vitiligo 08/02/2014    Past Surgical History:  Procedure Laterality Date   Beam Radiation     Prostate using Photons   COLONOSCOPY WITH PROPOFOL  N/A 09/18/2020   Procedure: COLONOSCOPY WITH PROPOFOL ;  Surgeon: Janalyn Keene NOVAK, MD;  Location: [facility] ENDOSCOPY;  Service: Endoscopy;  Laterality: N/A;   ILIAC VEIN ANGIOPLASTY / STENTING  2013   LEFT HEART CATH  01/11/2012   LOWER EXTREMITY ANGIOGRAPHY Left 10/18/2020   Procedure: LOWER EXTREMITY ANGIOGRAPHY;  Surgeon: Marea Selinda RAMAN, MD;  Location: [facility] CV [facility];  Service: Cardiovascular;  Laterality: Left;   PARTIAL NEPHRECTOMY Right 02/04/2008    Family History  Problem Relation Age of Onset   Hypertension Mother    Lung cancer Father    Hypertension Father    Hyperlipidemia Father    Diabetes Sister        oldest sister   Hypertension Sister    Hypertension Sister     Social History    Tobacco Use   Smoking status: Former    Current packs/day: 0.00    Average packs/day: 0.5 packs/day for 35.9 years (18.0 ttl pk-yrs)    Types: Cigarettes    Start date: 19    Quit date: 01/12/2012    Years since quitting: 11.7   Smokeless tobacco: Never  Substance Use Topics   Alcohol use: Yes    Alcohol/week: 2.0 standard drinks of alcohol    Types: 2 Shots of liquor per week    Comment: Minimal alcohol consumption     Current Outpatient Medications:    amLODipine -olmesartan  (AZOR ) 10-40 MG tablet, TAKE 1 TABLET BY MOUTH DAILY, Disp: 90 tablet, Rfl: 3   aspirin 81 MG tablet, Take 1 tablet by mouth daily., Disp: , Rfl:    atorvastatin  (LIPITOR) 80 MG tablet, Take 1 tablet (80 mg total) by mouth daily., Disp: 90 tablet, Rfl: 1   carvedilol  (COREG ) 6.25 MG tablet, Take 1 tablet (6.25 mg total) by mouth 2 (two) times daily., Disp: 180 tablet, Rfl: 0   cholecalciferol (VITAMIN D ) 1000 UNITS tablet, Take 1 tablet by mouth daily., Disp: , Rfl:    clopidogrel  (PLAVIX ) 75 MG tablet, Take 1 tablet (75 mg total) by mouth daily., Disp: 90 tablet, Rfl: 1   nitroGLYCERIN  (NITROSTAT ) 0.4 MG SL tablet, Place 1 tablet (0.4 mg total) under the tongue daily., Disp: 30 tablet, Rfl: 0   sildenafil  (VIAGRA ) 50 MG tablet, Take 50 mg by mouth as needed., Disp: , Rfl:   Allergies  Allergen Reactions   Lisinopril Swelling    Swelling around mouth, lips    I personally reviewed active problem list, medication list, allergies, family history with the patient/caregiver today.   ROS  Ten systems reviewed and is negative except as mentioned in HPI    Objective Physical Exam VITALS: BP- 128/84 CONSTITUTIONAL: Patient appears well-developed and well-nourished.  No distress. HEENT: Head atraumatic, normocephalic, neck supple. CARDIOVASCULAR: Normal rate, regular rhythm and normal heart sounds.  No murmur heard. No BLE edema. PULMONARY: Effort normal and breath sounds normal. No respiratory  distress. ABDOMINAL: There is no tenderness or distention. MUSCULOSKELETAL: Normal gait. Without gross motor or sensory deficit. PSYCHIATRIC: Patient has a normal mood and affect. behavior is normal. Judgment and thought content normal.  Vitals:   09/25/23 0940  BP: 128/84  Pulse: 78  Resp: 16  SpO2: 99%  Weight: 177 lb 4.8 oz (80.4 kg)  Height: 6' (1.829 m)    Body mass index is 24.05 kg/m.  Recent Results (from the past 2160 hours)  VAS US  ABI WITH/WO TBI     Status: None   Collection Time: 09/10/23  9:00 AM  Result Value Ref Range   Right ABI 1.11    Left ABI 1.06      PHQ2/9:    09/25/2023    9:36 AM 09/25/2022    8:12 AM 03/27/2022    9:06 AM 02/25/2022    9:45 AM 11/22/2021    8:57 AM  Depression  screen PHQ 2/9  Decreased Interest 0 0 0 0 0  Down, Depressed, Hopeless 0 0 0 0 0  PHQ - 2 Score 0 0 0 0 0  Altered sleeping  0 0 0 0  Tired, decreased energy  0 0 0 0  Change in appetite  0 0 0 0  Feeling bad or failure about yourself   0 0 0 0  Trouble concentrating  0 0 0 0  Moving slowly or fidgety/restless  0 0 0 0  Suicidal thoughts  0 0 0 0  PHQ-9 Score  0 0 0 0    phq 9 is negative  Fall Risk:    09/25/2023    9:36 AM 09/25/2022    8:12 AM 04/03/2022    2:33 PM 03/27/2022    9:05 AM 02/25/2022    9:45 AM  Fall Risk   Falls in the past year? 0 0 0 0 0  Number falls in past yr: 0      Injury with Fall? 0      Risk for fall due to : No Fall Risks No Fall Risks  No Fall Risks No Fall Risks  Follow up Falls evaluation completed Falls prevention discussed  Falls prevention discussed Falls prevention discussed      Data saved with a previous flowsheet row definition     Assessment & Plan Peripheral vascular disease with right iliac artery aneurysm, status post left lower extremity revascularization Condition stable with some changes in aneurysm size. ABI normal, indicating good blood flow post-revascularization. - Continue atorvastatin , carvedilol , and  Plavix . - Monitor blood pressure regularly. - Follow up with vascular specialist next year for ultrasound.  History of STEMI, status post PCI to LAD Well-managed with no chest pain or shortness of breath. Blood pressure well-controlled. - Continue atorvastatin , carvedilol , Plavix , and baby aspirin. - Ensure nitroglycerin  is available for emergency use.  History of kidney cancer Urinalysis shows trace blood, no visible hematuria. - Order urinalysis at [facility]. - Monitor for visible blood in urine and report if present.  History of prostate cancer, status post radiation therapy Regular PSA monitoring in place to check for recurrence. - Continue annual PSA monitoring.  Hypertension Well-controlled with current medication regimen. Blood pressure readings improved. - Continue carvedilol  and amlodipine . - Monitor blood pressure regularly.  Hyperlipidemia/Atherosclerosis of aorta Well-managed with atorvastatin . Lipid panel shows LDL at 41 and HDL at 72, within target range. - Continue atorvastatin  80 mg daily. - Maintain healthy diet and lifestyle choices.  Prediabetes A1c slightly above normal. Maintains healthy diet low in carbohydrates and remains active. - Monitor A1c levels regularly. - Continue current diet and exercise regimen.  Obstructive sleep apnea, not using CPAP CPAP not tolerated. Cardiologist aware of condition. - No current plan for CPAP use.   He goes to the [facility] for yearly physicals

## 2023-09-28 DIAGNOSIS — E8881 Metabolic syndrome: Secondary | ICD-10-CM | POA: Diagnosis not present

## 2023-09-28 DIAGNOSIS — I10 Essential (primary) hypertension: Secondary | ICD-10-CM | POA: Diagnosis not present

## 2023-09-28 DIAGNOSIS — I723 Aneurysm of iliac artery: Secondary | ICD-10-CM | POA: Diagnosis not present

## 2023-09-28 DIAGNOSIS — I700 Atherosclerosis of aorta: Secondary | ICD-10-CM | POA: Diagnosis not present

## 2023-09-28 DIAGNOSIS — Z85528 Personal history of other malignant neoplasm of kidney: Secondary | ICD-10-CM | POA: Diagnosis not present

## 2023-09-29 LAB — CBC WITH DIFFERENTIAL/PLATELET
Basophils Absolute: 0 x10E3/uL (ref 0.0–0.2)
Basos: 0 %
EOS (ABSOLUTE): 0.1 x10E3/uL (ref 0.0–0.4)
Eos: 2 %
Hematocrit: 43 % (ref 37.5–51.0)
Hemoglobin: 14.4 g/dL (ref 13.0–17.7)
Immature Grans (Abs): 0 x10E3/uL (ref 0.0–0.1)
Immature Granulocytes: 0 %
Lymphocytes Absolute: 2.3 x10E3/uL (ref 0.7–3.1)
Lymphs: 31 %
MCH: 34 pg — ABNORMAL HIGH (ref 26.6–33.0)
MCHC: 33.5 g/dL (ref 31.5–35.7)
MCV: 102 fL — ABNORMAL HIGH (ref 79–97)
Monocytes Absolute: 0.7 x10E3/uL (ref 0.1–0.9)
Monocytes: 10 %
Neutrophils Absolute: 4.2 x10E3/uL (ref 1.4–7.0)
Neutrophils: 57 %
Platelets: 266 x10E3/uL (ref 150–450)
RBC: 4.23 x10E6/uL (ref 4.14–5.80)
RDW: 13.2 % (ref 11.6–15.4)
WBC: 7.3 x10E3/uL (ref 3.4–10.8)

## 2023-09-29 LAB — COMPREHENSIVE METABOLIC PANEL WITH GFR
ALT: 45 IU/L — ABNORMAL HIGH (ref 0–44)
AST: 51 IU/L — ABNORMAL HIGH (ref 0–40)
Albumin: 4.8 g/dL (ref 3.9–4.9)
Alkaline Phosphatase: 75 IU/L (ref 44–121)
BUN/Creatinine Ratio: 20 (ref 10–24)
BUN: 41 mg/dL — ABNORMAL HIGH (ref 8–27)
Bilirubin Total: 0.4 mg/dL (ref 0.0–1.2)
CO2: 13 mmol/L — ABNORMAL LOW (ref 20–29)
Calcium: 10.6 mg/dL — ABNORMAL HIGH (ref 8.6–10.2)
Chloride: 107 mmol/L — ABNORMAL HIGH (ref 96–106)
Creatinine, Ser: 2.08 mg/dL — ABNORMAL HIGH (ref 0.76–1.27)
Globulin, Total: 2.8 g/dL (ref 1.5–4.5)
Glucose: 100 mg/dL — ABNORMAL HIGH (ref 70–99)
Potassium: 5.1 mmol/L (ref 3.5–5.2)
Sodium: 136 mmol/L (ref 134–144)
Total Protein: 7.6 g/dL (ref 6.0–8.5)
eGFR: 35 mL/min/1.73 — ABNORMAL LOW (ref >59)

## 2023-09-29 LAB — URINALYSIS, COMPLETE
Bilirubin, UA: NEGATIVE
Glucose, UA: NEGATIVE
Leukocytes,UA: NEGATIVE
Nitrite, UA: NEGATIVE
Specific Gravity, UA: 1.022 (ref 1.005–1.030)
Urobilinogen, Ur: 0.2 mg/dL (ref 0.2–1.0)
pH, UA: 5.5 (ref 5.0–7.5)

## 2023-09-29 LAB — MICROSCOPIC EXAMINATION
Bacteria, UA: NONE SEEN
Casts: NONE SEEN /LPF
RBC, Urine: NONE SEEN /HPF (ref 0–2)
WBC, UA: NONE SEEN /HPF (ref 0–5)

## 2023-09-29 LAB — HEMOGLOBIN A1C
Est. average glucose Bld gHb Est-mCnc: 111 mg/dL
Hgb A1c MFr Bld: 5.5 % (ref 4.8–5.6)

## 2023-09-29 LAB — LIPID PANEL
Chol/HDL Ratio: 2.2 ratio (ref 0.0–5.0)
Cholesterol, Total: 148 mg/dL (ref 100–199)
HDL: 67 mg/dL (ref >39)
LDL Chol Calc (NIH): 61 mg/dL (ref 0–99)
Triglycerides: 116 mg/dL (ref 0–149)
VLDL Cholesterol Cal: 20 mg/dL (ref 5–40)

## 2023-10-03 ENCOUNTER — Ambulatory Visit: Payer: Self-pay | Admitting: Family Medicine

## 2023-10-03 ENCOUNTER — Other Ambulatory Visit: Payer: Self-pay | Admitting: Family Medicine

## 2023-10-03 DIAGNOSIS — R944 Abnormal results of kidney function studies: Secondary | ICD-10-CM

## 2023-10-03 DIAGNOSIS — Z79899 Other long term (current) drug therapy: Secondary | ICD-10-CM

## 2023-10-03 DIAGNOSIS — R7989 Other specified abnormal findings of blood chemistry: Secondary | ICD-10-CM

## 2023-10-09 DIAGNOSIS — Z79899 Other long term (current) drug therapy: Secondary | ICD-10-CM | POA: Diagnosis not present

## 2023-10-09 DIAGNOSIS — R944 Abnormal results of kidney function studies: Secondary | ICD-10-CM | POA: Diagnosis not present

## 2023-10-09 DIAGNOSIS — R7989 Other specified abnormal findings of blood chemistry: Secondary | ICD-10-CM | POA: Diagnosis not present

## 2023-10-10 LAB — CBC WITH DIFFERENTIAL/PLATELET
Absolute Lymphocytes: 2902 {cells}/uL (ref 850–3900)
Absolute Monocytes: 684 {cells}/uL (ref 200–950)
Basophils Absolute: 22 {cells}/uL (ref 0–200)
Basophils Relative: 0.3 %
Eosinophils Absolute: 158 {cells}/uL (ref 15–500)
Eosinophils Relative: 2.2 %
HCT: 38.8 % (ref 38.5–50.0)
Hemoglobin: 13 g/dL — ABNORMAL LOW (ref 13.2–17.1)
MCH: 33.8 pg — ABNORMAL HIGH (ref 27.0–33.0)
MCHC: 33.5 g/dL (ref 32.0–36.0)
MCV: 100.8 fL — ABNORMAL HIGH (ref 80.0–100.0)
MPV: 8.6 fL (ref 7.5–12.5)
Monocytes Relative: 9.5 %
Neutro Abs: 3434 {cells}/uL (ref 1500–7800)
Neutrophils Relative %: 47.7 %
Platelets: 319 Thousand/uL (ref 140–400)
RBC: 3.85 Million/uL — ABNORMAL LOW (ref 4.20–5.80)
RDW: 12.8 % (ref 11.0–15.0)
Total Lymphocyte: 40.3 %
WBC: 7.2 Thousand/uL (ref 3.8–10.8)

## 2023-10-10 LAB — COMPREHENSIVE METABOLIC PANEL WITH GFR
AG Ratio: 1.6 (calc) (ref 1.0–2.5)
ALT: 20 U/L (ref 9–46)
AST: 22 U/L (ref 10–35)
Albumin: 4.4 g/dL (ref 3.6–5.1)
Alkaline phosphatase (APISO): 55 U/L (ref 35–144)
BUN/Creatinine Ratio: 23 (calc) — ABNORMAL HIGH (ref 6–22)
BUN: 40 mg/dL — ABNORMAL HIGH (ref 7–25)
CO2: 15 mmol/L — ABNORMAL LOW (ref 20–32)
Calcium: 10.1 mg/dL (ref 8.6–10.3)
Chloride: 115 mmol/L — ABNORMAL HIGH (ref 98–110)
Creat: 1.71 mg/dL — ABNORMAL HIGH (ref 0.70–1.35)
Globulin: 2.7 g/dL (ref 1.9–3.7)
Glucose, Bld: 101 mg/dL — ABNORMAL HIGH (ref 65–99)
Potassium: 5 mmol/L (ref 3.5–5.3)
Sodium: 137 mmol/L (ref 135–146)
Total Bilirubin: 0.3 mg/dL (ref 0.2–1.2)
Total Protein: 7.1 g/dL (ref 6.1–8.1)
eGFR: 44 mL/min/1.73m2 — ABNORMAL LOW (ref >=60)

## 2023-10-10 LAB — PARATHYROID HORMONE, INTACT (NO CA): PTH: 38 pg/mL (ref 16–77)

## 2023-10-10 LAB — B12 AND FOLATE PANEL
Folate: 8.5 ng/mL
Vitamin B-12: 445 pg/mL (ref 200–1100)

## 2023-10-12 ENCOUNTER — Ambulatory Visit: Payer: Self-pay | Admitting: Family Medicine

## 2023-10-12 DIAGNOSIS — R944 Abnormal results of kidney function studies: Secondary | ICD-10-CM

## 2023-11-29 ENCOUNTER — Other Ambulatory Visit: Payer: Self-pay | Admitting: Family Medicine

## 2023-11-29 DIAGNOSIS — I252 Old myocardial infarction: Secondary | ICD-10-CM

## 2023-12-10 ENCOUNTER — Ambulatory Visit: Payer: Self-pay

## 2023-12-10 NOTE — Telephone Encounter (Signed)
 Call dropped while attempting to schedule pt appt: attempted to call pt back and went straight to voice mail: will attempt to call pt back again.

## 2023-12-10 NOTE — Telephone Encounter (Signed)
 FYI Only or Action Required?: FYI only for provider: appointment scheduled on 12/15/2023 per patient request.  Patient was last seen in primary care on 09/25/2023 by Glenard Mire, MD.  Called Nurse Triage reporting Rash.  Symptoms began a week ago.  Interventions attempted: OTC medications: cortisone cream without relief.  Symptoms are: gradually worsening.  Triage Disposition: See PCP When Office is Open (Within 3 Days)  Patient/caregiver understands and will follow disposition?: YesCopied from CRM [id number]. Topic: Clinical - Red Word Triage >> Dec 10, 2023  9:08 AM Roberto Walker wrote: Red Word that prompted transfer to Nurse Triage: The patient has had skin irritation on the left hand for one week, with pain, swelling, and itchiness Reason for Disposition  Mild widespread rash  (Exception: Heat rash lasting 3 days or less.)    Pt out of town therefore per pt request scheduled appt for Tuesday 12/15/2023  Answer Assessment - Initial Assessment Questions 1. APPEARANCE of RASH: What does the rash look like? (e.g., blisters, dry flaky skin, red spots, redness, sores)     Yellowish, scaly, swelling, itchiness 2. SIZE: How big are the spots? (e.g., tip of pen, eraser, coin; inches, centimeters)     na 3. LOCATION: Where is the rash located?     Left hand top 4 fingers and bottom 1st finger on right hand 4. COLOR: What color is the rash? (Note: It is difficult to assess rash color in people with darker-colored skin. When this situation occurs, simply ask the caller to describe what they see.)     yellowish 5. ONSET: When did the rash begin?     X week 6. FEVER: Do you have a fever? If Yes, ask: What is your temperature, how was it measured, and when did it start?     no 7. ITCHING: Does the rash itch? If Yes, ask: How bad is the itch? (Scale 1-10; or mild, moderate, severe)     moderate 8. CAUSE: What do you think is causing the rash?     unknown 9. MEDICINE  FACTORS: Have you started any new medicines within the last 2 weeks? (e.g., antibiotics)      no 10. OTHER SYMPTOMS: Do you have any other symptoms? (e.g., dizziness, headache, sore throat, joint pain)       3/10 pain, irritation 11. PREGNANCY: Is there any chance you are pregnant? When was your last menstrual period?       Na Cortisone 10: without relief x week.  Pt out of town currently and will not be back in town until Monday: nurse schedule appt on Tuesday .  Protocols used: Rash or Redness - [facility]

## 2023-12-15 ENCOUNTER — Encounter: Payer: Self-pay | Admitting: Family Medicine

## 2023-12-15 ENCOUNTER — Ambulatory Visit (INDEPENDENT_AMBULATORY_CARE_PROVIDER_SITE_OTHER): Admitting: Family Medicine

## 2023-12-15 VITALS — BP 154/96 | HR 50 | Temp 98.6°F | Ht 72.0 in | Wt 178.0 lb

## 2023-12-15 DIAGNOSIS — L309 Dermatitis, unspecified: Secondary | ICD-10-CM

## 2023-12-15 DIAGNOSIS — I10 Essential (primary) hypertension: Secondary | ICD-10-CM

## 2023-12-15 MED ORDER — TRIAMCINOLONE ACETONIDE 0.1 % EX CREA: 1.0000 | TOPICAL_CREAM | Freq: Two times a day (BID) | CUTANEOUS | 1 refills | Status: AC

## 2023-12-15 MED ORDER — SULFAMETHOXAZOLE-TRIMETHOPRIM 800-160 MG PO TABS: 1.0000 | ORAL_TABLET | Freq: Two times a day (BID) | ORAL | 0 refills | Status: AC

## 2023-12-15 NOTE — Progress Notes (Signed)
 Acute Office Visit  Introduced to nurse practitioner role and practice setting.  All questions answered.  Discussed provider/patient relationship and expectations.   Subjective:     Patient ID: Roberto Walker, male    DOB: 07/31/1960, 63 y.o.   MRN: [id number]  Chief Complaint  Patient presents with   Rash    Patient presents with dry, scaly and itchy skin on the first 2 fingers of his left hand.  He states it has been like that for 2 weeks.  He has tried OTC Cortisone with no relief.      Discussed the use of AI scribe software for clinical note transcription with the patient, who gave verbal consent to proceed.  History of Present Illness Roberto Walker is a 63 year old male who presents with dry, itchy skin on his left hand.  He has dry, itchy skin on two fingers of his left hand, described as dry, pilly, and scratchy. The skin cracks and peels, leading to bleeding when the skin pulls off. He has tried using hydrocortisone cream without relief.  This is the first occurrence of this issue. He denies any history of eczema or psoriasis. There has been no recent exposure to new substances, changes in medications, foods, or known allergies, except for lisinopril. No regular use of gloves and no similar symptoms in the past.  No fevers, chills, no foods, no lotions, pets, or environmental exposures.  HPI  ROS      Objective:    BP (!) 154/96 (BP Location: Left Arm, Patient Position: Sitting, Cuff Size: Normal)   Pulse (!) 50   Temp 98.6 F (37 C) (Oral)   Ht 6' (1.829 m)   Wt 178 lb (80.7 kg)   SpO2 100%   BMI 24.14 kg/m    Physical Exam Constitutional:      General: He is not in acute distress.    Appearance: Normal appearance. He is not ill-appearing, toxic-appearing or diaphoretic.  HENT:     Head: Normocephalic.  Eyes:     Extraocular Movements: Extraocular movements intact.     Conjunctiva/sclera: Conjunctivae normal.     Pupils: Pupils are equal, round, and  reactive to light.  Cardiovascular:     Rate and Rhythm: Normal rate and regular rhythm.     Heart sounds: No murmur heard.    No friction rub. No gallop.  Pulmonary:     Effort: Pulmonary effort is normal. No respiratory distress.     Breath sounds: Normal breath sounds. No stridor. No wheezing, rhonchi or rales.  Chest:     Chest wall: No tenderness.  Musculoskeletal:     Right lower leg: No edema.     Left lower leg: No edema.  Skin:    General: Skin is warm and dry.     Capillary Refill: Capillary refill takes less than 2 seconds.     Findings: Rash present.  Neurological:     General: No focal deficit present.     Mental Status: He is alert and oriented to person, place, and time. Mental status is at baseline.     Cranial Nerves: No cranial nerve deficit.     Sensory: No sensory deficit.     Motor: No weakness.     Coordination: Coordination normal.     Gait: Gait normal.  Psychiatric:        Mood and Affect: Mood normal.        Behavior: Behavior normal.  Thought Content: Thought content normal.        Judgment: Judgment normal.     No results found for any visits on 12/15/23.      Assessment & Plan:  Assessment and Plan Assessment & Plan Dermatitis of left hand fingers Acute dry, itchy, and painful dermatitis on the left hand fingers, with peeling and cracking skin.  No hx of eczema, psoriasis, or recent exposure to irritants. No bleeding, drainage, bug bites, or exposure to plants - localized, no where else on body - OTC Hydrocortisone has been ineffective. - Left second third fingers appear rough, excoriated, open healing scabs from pt picking/itching - does not appear fungal in nature - contact dermatitis vs atopic dermatitis - does not appear to be viral in nature, does not follow dermatome.  - Prescribed triamcinolone cream BID to affected fingers -  given co morbidites, open cracked skin - will prescribed Bactrim BID to rule out any underlying  infection/prophylaxis to bacteria exposure - Use thick emollient to fingers like aquaphor or ecurine twice daily - unscented soaps/detergents.  - purchase new gardening gloves - if no improvement/persists after two weeks - recommend derm referral   HTN - elevated today - typically controlled - GOAL<130/80 - monitor at home - close follow up with pcp if continues to be elvated - continue azor  and coreg   Problem List Items Addressed This Visit       Cardiovascular and Mediastinum   Benign essential HTN   Other Visit Diagnoses       Dermatitis    -  Primary   Relevant Medications   triamcinolone cream (KENALOG) 0.1 %   sulfamethoxazole-trimethoprim (BACTRIM DS) 800-160 MG tablet       Meds ordered this encounter  Medications   triamcinolone cream (KENALOG) 0.1 %    Sig: Apply 1 Application topically 2 (two) times daily. To affected second and third left fingers    Dispense:  30 g    Refill:  1   sulfamethoxazole-trimethoprim (BACTRIM DS) 800-160 MG tablet    Sig: Take 1 tablet by mouth 2 (two) times daily for 5 days.    Dispense:  10 tablet    Refill:  0    No follow-ups on file.  Curtis DELENA Boom, FNP  I, Curtis DELENA Boom, FNP, have reviewed all documentation for this visit. The documentation on 12/15/23 for the exam, diagnosis, procedures, and orders are all accurate and complete.

## 2023-12-19 ENCOUNTER — Other Ambulatory Visit: Payer: Self-pay | Admitting: Family Medicine

## 2023-12-19 DIAGNOSIS — I10 Essential (primary) hypertension: Secondary | ICD-10-CM

## 2023-12-22 NOTE — Telephone Encounter (Signed)
 Second request

## 2024-01-18 DIAGNOSIS — R944 Abnormal results of kidney function studies: Secondary | ICD-10-CM | POA: Diagnosis not present

## 2024-01-19 LAB — BASIC METABOLIC PANEL WITH GFR
BUN: 13 mg/dL (ref 7–25)
CO2: 22 mmol/L (ref 20–32)
Calcium: 10.4 mg/dL — ABNORMAL HIGH (ref 8.6–10.3)
Chloride: 110 mmol/L (ref 98–110)
Creat: 1.02 mg/dL (ref 0.70–1.35)
Glucose, Bld: 100 mg/dL — ABNORMAL HIGH (ref 65–99)
Potassium: 4 mmol/L (ref 3.5–5.3)
Sodium: 142 mmol/L (ref 135–146)
eGFR: 83 mL/min/1.73m2 (ref >=60)

## 2024-01-25 ENCOUNTER — Ambulatory Visit: Admitting: Dermatology

## 2024-01-25 ENCOUNTER — Encounter: Payer: Self-pay | Admitting: Dermatology

## 2024-01-25 DIAGNOSIS — Z7189 Other specified counseling: Secondary | ICD-10-CM

## 2024-01-25 DIAGNOSIS — L209 Atopic dermatitis, unspecified: Secondary | ICD-10-CM | POA: Diagnosis not present

## 2024-01-25 DIAGNOSIS — Z79899 Other long term (current) drug therapy: Secondary | ICD-10-CM

## 2024-01-25 MED ORDER — CLOBETASOL PROPIONATE 0.05 % EX OINT: 1.0000 | TOPICAL_OINTMENT | Freq: Two times a day (BID) | CUTANEOUS | 2 refills | Status: DC

## 2024-01-25 MED ORDER — TACROLIMUS 0.1 % EX OINT: TOPICAL_OINTMENT | Freq: Two times a day (BID) | CUTANEOUS | 2 refills | Status: AC

## 2024-01-25 NOTE — Progress Notes (Signed)
" ° °  New Patient Visit   Subjective  Roberto Walker is a 62 y.o. male who presents for the following: Rash, for the past 30 days he has noticed a dry, scaly, itchy, rash on his hands and bilateral legs. He has discoloration of the hands he believes is caused by the rash.  On 12/15/23 his PCP prescribed him Triamcinolone  twice daily, he only gets relief from the itchiness but other symptoms continue.  He recalls that he was working in the yard before this rash started, so he is not sure if this is an allergic reaction to the leaves.   The following portions of the chart were reviewed this encounter and updated as appropriate: medications, allergies, medical history  Review of Systems:  No other skin or systemic complaints except as noted in HPI or Assessment and Plan.  Objective  Well appearing patient in no apparent distress; mood and affect are within normal limits.  A focused examination was performed of the following areas: Hands, legs,arms  Relevant exam findings are noted in the Assessment and Plan.    Assessment & Plan    ATOPIC DERMATITIS, UNSPECIFIED TYPE   COUNSELING AND COORDINATION OF CARE   MEDICATION MANAGEMENT    Rash-severe eczema Exam: lichenified xerotic scaly coalescing plaques with hyper and hypopigmentation on b/l dorsal fingers and ventral wrists. Xerosis fissuring L palmar hand. Red oval scaly hyperpigmented thin plaques lower legs  Chronic and persistent condition with duration or expected duration over one year. Condition is bothersome/symptomatic for patient. Currently flared.   Treatment Plan: Start clobetasol  0.05% ointment twice daily until smooth. Avoid applying to face, groin, and axilla. Use as directed. Long-term use can cause thinning of the skin. Start tacrolimus  ointment twice daily.  Topical steroids (such as triamcinolone , fluocinolone, fluocinonide, mometasone, clobetasol , halobetasol, betamethasone, hydrocortisone) can cause thinning  and lightening of the skin if they are used for too long in the same area. Your physician has selected the right strength medicine for your problem and area affected on the body. Please use your medication only as directed by your physician to prevent side effects.   Discussed Dupixent. Consider starting if not improved after starting.          Return in about 1 month (around 02/25/2024) for with Dr. Claudene, Eczema.  LILLETTE Roberto Walker, RMA, am acting as scribe for Boneta Claudene, MD .   Documentation: I have reviewed the above documentation for accuracy and completeness, and I agree with the above.  Boneta Claudene, MD    "

## 2024-01-25 NOTE — Patient Instructions (Signed)

## 2024-02-25 ENCOUNTER — Ambulatory Visit (INDEPENDENT_AMBULATORY_CARE_PROVIDER_SITE_OTHER): Admitting: Dermatology

## 2024-02-25 DIAGNOSIS — Z7189 Other specified counseling: Secondary | ICD-10-CM

## 2024-02-25 DIAGNOSIS — L209 Atopic dermatitis, unspecified: Secondary | ICD-10-CM

## 2024-02-25 DIAGNOSIS — Z79899 Other long term (current) drug therapy: Secondary | ICD-10-CM

## 2024-02-25 MED ORDER — CLOBETASOL PROPIONATE 0.05 % EX OINT: 1.0000 | TOPICAL_OINTMENT | Freq: Two times a day (BID) | CUTANEOUS | 4 refills | Status: AC

## 2024-02-25 NOTE — Patient Instructions (Signed)

## 2024-02-25 NOTE — Progress Notes (Signed)
" ° °  Follow-Up Visit   Subjective  Roberto Walker is a 64 y.o. male who presents for the following: Eczema hands 28m f/u, improved, Clobetasol  oint bid, tacrolimus  oint not covered by insurance   The following portions of the chart were reviewed this encounter and updated as appropriate: medications, allergies, medical history  Review of Systems:  No other skin or systemic complaints except as noted in HPI or Assessment and Plan.  Objective  Well appearing patient in no apparent distress; mood and affect are within normal limits.   A focused examination was performed of the following areas: hands  Relevant exam findings are noted in the Assessment and Plan.    Assessment & Plan    ATOPIC DERMATITIS, severe, affecting special site (hands) hands Exam: Scaly hypo and hyperpigmented lichenified plaques on dorsal fingers and hands, improved 2% BSA  Chronic and persistent condition with duration or expected duration over one year. Condition is improving with treatment but not currently at goal.   Atopic dermatitis (eczema) is a chronic, relapsing, pruritic condition that can significantly affect quality of life. It is often associated with allergic rhinitis and/or asthma and can require treatment with topical medications, phototherapy, or in severe cases biologic injectable medication (Dupixent; Adbry) or Oral JAK inhibitors.  Treatment Plan: Cont Clobetasol  oint bid aa hands until skin smooth, avoid face, groin, axilla Discussed if not continuing to improve can consider Dupixent  Recommend gentle skin care.  ATOPIC DERMATITIS, UNSPECIFIED TYPE   COUNSELING AND COORDINATION OF CARE   MEDICATION MANAGEMENT    Return in about 3 months (around 05/25/2024) for Atopic Derm.  I, Grayce Saunas, RMA, am acting as scribe for Boneta Sharps, MD .   Documentation: I have reviewed the above documentation for accuracy and completeness, and I agree with the above.  Boneta Sharps, MD    "

## 2024-03-02 ENCOUNTER — Encounter: Payer: Self-pay | Admitting: Dermatology

## 2024-05-26 ENCOUNTER — Ambulatory Visit: Admitting: Dermatology

## 2024-09-09 ENCOUNTER — Ambulatory Visit (INDEPENDENT_AMBULATORY_CARE_PROVIDER_SITE_OTHER): Admitting: Nurse Practitioner

## 2024-09-09 ENCOUNTER — Encounter (INDEPENDENT_AMBULATORY_CARE_PROVIDER_SITE_OTHER)

## 2024-09-26 ENCOUNTER — Ambulatory Visit: Admitting: Family Medicine
